# Patient Record
Sex: Male | Born: 1960 | Race: White | Hispanic: No | Smoking: Current every day smoker
Health system: Southern US, Community
[De-identification: ages and names within clinical notes are randomized; demographics above are authoritative.]

## PROBLEM LIST (undated history)

## (undated) DIAGNOSIS — M5136 Other intervertebral disc degeneration, lumbar region: Secondary | ICD-10-CM

## (undated) DIAGNOSIS — B1920 Unspecified viral hepatitis C without hepatic coma: Secondary | ICD-10-CM

## (undated) DIAGNOSIS — M48061 Spinal stenosis, lumbar region without neurogenic claudication: Secondary | ICD-10-CM

## (undated) DIAGNOSIS — I10 Essential (primary) hypertension: Secondary | ICD-10-CM

## (undated) DIAGNOSIS — M4300 Spondylolysis, site unspecified: Secondary | ICD-10-CM

## (undated) DIAGNOSIS — R011 Cardiac murmur, unspecified: Secondary | ICD-10-CM

## (undated) DIAGNOSIS — F32A Depression, unspecified: Secondary | ICD-10-CM

## (undated) DIAGNOSIS — M5416 Radiculopathy, lumbar region: Secondary | ICD-10-CM

## (undated) DIAGNOSIS — Z720 Tobacco use: Secondary | ICD-10-CM

## (undated) DIAGNOSIS — M51369 Other intervertebral disc degeneration, lumbar region without mention of lumbar back pain or lower extremity pain: Secondary | ICD-10-CM

## (undated) HISTORY — DX: Spondylolysis, site unspecified: M43.00

## (undated) HISTORY — DX: Spinal stenosis, lumbar region without neurogenic claudication: M48.061

## (undated) HISTORY — DX: Tobacco use: Z72.0

## (undated) HISTORY — DX: Radiculopathy, lumbar region: M54.16

## (undated) HISTORY — DX: Other intervertebral disc degeneration, lumbar region without mention of lumbar back pain or lower extremity pain: M51.369

## (undated) HISTORY — DX: Unspecified viral hepatitis C without hepatic coma: B19.20

## (undated) HISTORY — DX: Other intervertebral disc degeneration, lumbar region: M51.36

## (undated) HISTORY — DX: Essential (primary) hypertension: I10

---

## 2001-10-10 ENCOUNTER — Emergency Department (HOSPITAL_COMMUNITY): Admission: EM | Admit: 2001-10-10 | Discharge: 2001-10-10 | Payer: Self-pay | Admitting: Emergency Medicine

## 2018-05-01 ENCOUNTER — Encounter: Payer: Self-pay | Admitting: Hematology

## 2018-05-07 ENCOUNTER — Inpatient Hospital Stay (HOSPITAL_COMMUNITY): Payer: Managed Care, Other (non HMO)

## 2018-05-07 ENCOUNTER — Encounter (HOSPITAL_COMMUNITY): Payer: Self-pay | Admitting: Hematology

## 2018-05-07 ENCOUNTER — Inpatient Hospital Stay (HOSPITAL_COMMUNITY): Payer: Managed Care, Other (non HMO) | Attending: Hematology | Admitting: Hematology

## 2018-05-07 VITALS — BP 148/87 | HR 63 | Temp 97.7°F | Resp 14 | Ht 73.75 in | Wt 194.8 lb

## 2018-05-07 DIAGNOSIS — D7589 Other specified diseases of blood and blood-forming organs: Secondary | ICD-10-CM

## 2018-05-07 DIAGNOSIS — R634 Abnormal weight loss: Secondary | ICD-10-CM | POA: Diagnosis not present

## 2018-05-07 DIAGNOSIS — D61818 Other pancytopenia: Secondary | ICD-10-CM | POA: Diagnosis present

## 2018-05-07 DIAGNOSIS — D696 Thrombocytopenia, unspecified: Secondary | ICD-10-CM

## 2018-05-07 DIAGNOSIS — F17210 Nicotine dependence, cigarettes, uncomplicated: Secondary | ICD-10-CM | POA: Insufficient documentation

## 2018-05-07 LAB — COMPREHENSIVE METABOLIC PANEL
ALT: 55 U/L — ABNORMAL HIGH (ref 0–44)
AST: 57 U/L — ABNORMAL HIGH (ref 15–41)
Albumin: 3.6 g/dL (ref 3.5–5.0)
Alkaline Phosphatase: 51 U/L (ref 38–126)
Anion gap: 6 (ref 5–15)
BUN: 14 mg/dL (ref 6–20)
CO2: 27 mmol/L (ref 22–32)
Calcium: 8.8 mg/dL — ABNORMAL LOW (ref 8.9–10.3)
Chloride: 107 mmol/L (ref 98–111)
Creatinine, Ser: 0.95 mg/dL (ref 0.61–1.24)
GFR calc Af Amer: >60 mL/min (ref >60)
GFR calc non Af Amer: >60 mL/min (ref >60)
Glucose, Bld: 102 mg/dL — ABNORMAL HIGH (ref 70–99)
Potassium: 4.2 mmol/L (ref 3.5–5.1)
Sodium: 140 mmol/L (ref 135–145)
Total Bilirubin: 0.4 mg/dL (ref 0.3–1.2)
Total Protein: 7.7 g/dL (ref 6.5–8.1)

## 2018-05-07 LAB — CBC WITH DIFFERENTIAL/PLATELET
Basophils Absolute: 0 10*3/uL (ref 0.0–0.1)
Basophils Relative: 1 %
Eosinophils Absolute: 0.1 10*3/uL (ref 0.0–0.7)
Eosinophils Relative: 2 %
HCT: 37.6 % — ABNORMAL LOW (ref 39.0–52.0)
Hemoglobin: 12.6 g/dL — ABNORMAL LOW (ref 13.0–17.0)
Lymphocytes Relative: 62 %
Lymphs Abs: 1.4 10*3/uL (ref 0.7–4.0)
MCH: 34.2 pg — ABNORMAL HIGH (ref 26.0–34.0)
MCHC: 33.5 g/dL (ref 30.0–36.0)
MCV: 102.2 fL — ABNORMAL HIGH (ref 78.0–100.0)
Monocytes Absolute: 0 10*3/uL — ABNORMAL LOW (ref 0.1–1.0)
Monocytes Relative: 1 %
Neutro Abs: 0.8 10*3/uL — ABNORMAL LOW (ref 1.7–7.7)
Neutrophils Relative %: 34 %
Platelets: 85 10*3/uL — ABNORMAL LOW (ref 150–400)
RBC: 3.68 MIL/uL — ABNORMAL LOW (ref 4.22–5.81)
RDW: 14 % (ref 11.5–15.5)
WBC: 2.2 10*3/uL — ABNORMAL LOW (ref 4.0–10.5)

## 2018-05-07 LAB — TSH: TSH: 0.51 u[IU]/mL (ref 0.350–4.500)

## 2018-05-07 LAB — RETICULOCYTES
RBC.: 3.68 MIL/uL — ABNORMAL LOW (ref 4.22–5.81)
Retic Count, Absolute: 47.8 10*3/uL (ref 19.0–186.0)
Retic Ct Pct: 1.3 % (ref 0.4–3.1)

## 2018-05-07 LAB — LACTATE DEHYDROGENASE: LDH: 137 U/L (ref 98–192)

## 2018-05-07 LAB — VITAMIN B12: Vitamin B-12: 147 pg/mL — ABNORMAL LOW (ref 180–914)

## 2018-05-07 NOTE — Assessment & Plan Note (Addendum)
1.  Pancytopenia: -CBC on 04/23/2018 shows white count of 2.4, platelet count of 88 with an MCV of 107. -This was repeated on 05/01/2018 when the white count was 2.9, platelet count of 107 with a normal differential. -Patient reports throat infection, was seen at his PMDs office on 04/10/2018 and was treated with Z-Pak and nystatin. - Work-up for pancytopenia including K81 and folic acid showed normal folic acid.  B12 was 292 which was borderline. - Differential diagnosis includes drug-induced cytopenias.  Denies any history of liver disorders.  Does not drink alcohol.  We will repeat his CBC with differential and review the smear.  As there is a history of elevated liver enzymes, we will repeat comprehensive metabolic panel today.  We will also check a B12 level, TSH, SPEP, LDH and reticulocyte count. -We will do further work-up including scans if his LFTs remain elevated.  2.  Macrocytosis: -His MCV was found to be elevated at 107 with the first set of labs and 100 with the second set of labs. -Differential diagnosis includes B12 deficiency, liver disease, hypothyroidism.

## 2018-05-07 NOTE — Progress Notes (Signed)
CONSULT NOTE  Patient Care Team: Celene Squibb, MD as PCP - General (Internal Medicine)  CHIEF COMPLAINTS/PURPOSE OF CONSULTATION:  Pancytopenia and macrocytosis.  HISTORY OF PRESENTING ILLNESS:  Logan Brady 57 y.o. male is seen in consultation today for further work-up of pancytopenia and thrombocytosis.  He reportedly had a throat infection and was seen by his PMD on 04/10/2018.  He was treated with Z-Pak and nystatin, when strep was ruled out.  Blood work was drawn on 04/23/2018 which showed elevated liver enzymes and leukopenia and thrombocytopenia with white count of 2.4 and platelet count of 88 respectively.  He had a repeat blood work on 05/01/2018 which showed white count improved to 2.9, platelet count of 107.  MCV was high on both occasions.  Folic acid was normal.  B12 was borderline.  He denies any tingling or numbness in extremities.  He denies any fevers or night sweats.  He did have a 15 pound weight loss when he had flu 4 to 5 months ago and could not eat for a week.  He gained some of the weight back.  He gives a history that he was told to take B complex vitamin 4 years ago by his previous doctor.  He did not take it but for a few months.  He was also taking ibuprofen close to thousand milligrams daily for 5 years until 6 months ago for his back pain.  He discontinued it as he developed a reaction to it in the form of skin rash. 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  He denies any occupational exposure to chemicals.  MEDICAL HISTORY:  Past Medical History:  Diagnosis Date  . Tobacco abuse     SURGICAL HISTORY: History reviewed. No pertinent surgical history.  SOCIAL HISTORY: Social History   Socioeconomic History  . Marital status: Divorced    Spouse name: Not on file  . Number of children: 4  . Years of education: 51  . Highest education level: High school graduate  Occupational  History  . Occupation: maintenance  Social Needs  . Financial resource strain: Not hard at all  . Food insecurity:    Worry: Never true    Inability: Never true  . Transportation needs:    Medical: No    Non-medical: No  Tobacco Use  . Smoking status: Current Every Day Smoker    Packs/day: 1.00    Years: 40.00    Pack years: 40.00  . Smokeless tobacco: Never Used  Substance and Sexual Activity  . Alcohol use: Not Currently  . Drug use: Not Currently    Comment: quit 30 years ago  . Sexual activity: Yes  Lifestyle  . Physical activity:    Days per week: 2 days    Minutes per session: 30 min  . Stress: Not at all  Relationships  . Social connections:    Talks on phone: More than three times a week    Gets together: More than three times a week    Attends religious service: Never    Active member of club or organization: No    Attends meetings of clubs or organizations: Never    Relationship status: Divorced  . Intimate partner violence:    Fear of current or ex partner: No    Emotionally abused: No    Physically abused: No    Forced sexual activity: No  Other Topics Concern  .  Not on file  Social History Narrative  . Not on file    FAMILY HISTORY: Family History  Problem Relation Age of Onset  . Heart attack Mother   . Lung cancer Father     ALLERGIES:  has No Known Allergies.  MEDICATIONS:  No current outpatient medications on file.   No current facility-administered medications for this visit.     REVIEW OF SYSTEMS:   Constitutional: Denies fevers, chills or abnormal night sweats Eyes: Denies blurriness of vision, double vision or watery eyes Ears, nose, mouth, throat, and face: Denies mucositis or sore throat Respiratory: Denies cough, dyspnea or wheezes Cardiovascular: Denies palpitation, chest discomfort or lower extremity swelling Gastrointestinal:  Denies nausea, heartburn or change in bowel habits Skin: Denies abnormal skin rashes Lymphatics:  Denies new lymphadenopathy or easy bruising Neurological:Denies numbness, tingling or new weaknesses Behavioral/Psych: Mood is stable, no new changes  All other systems were reviewed with the patient and are negative.  PHYSICAL EXAMINATION: ECOG PERFORMANCE STATUS: 0 - Asymptomatic  Vitals:   05/07/18 1129  BP: (!) 148/87  Pulse: 63  Resp: 14  Temp: 97.7 F (36.5 C)  SpO2: 99%   Filed Weights   05/07/18 1129  Weight: 194 lb 12.8 oz (88.4 kg)    GENERAL:alert, no distress and comfortable SKIN: skin color, texture, turgor are normal, no rashes or significant lesions EYES: normal, conjunctiva are pink and non-injected, sclera clear OROPHARYNX:no exudate, no erythema and lips, buccal mucosa, and tongue normal  NECK: supple, thyroid normal size, non-tender, without nodularity LYMPH:  no palpable lymphadenopathy in the cervical, axillary or inguinal LUNGS: clear to auscultation and percussion with normal breathing effort HEART: regular rate & rhythm and no murmurs and no lower extremity edema ABDOMEN:abdomen soft, non-tender and normal bowel sounds  PSYCH: alert & oriented x 3 with fluent speech   LABORATORY DATA:  I have reviewed the labs from Dr. Juel Burrow office.     ASSESSMENT & PLAN:  Other pancytopenia ([place]) 1.  Pancytopenia: -CBC on 04/23/2018 shows white count of 2.4, platelet count of 88 with an MCV of 107. -This was repeated on 05/01/2018 when the white count was 2.9, platelet count of 107 with a normal differential. -Patient reports throat infection, was seen at his PMDs office on 04/10/2018 and was treated with Z-Pak and nystatin. - Work-up for pancytopenia including N02 and folic acid showed normal folic acid.  B12 was 292 which was borderline. - Differential diagnosis includes drug-induced cytopenias.  Denies any history of liver disorders.  Does not drink alcohol.  We will repeat his CBC with differential and review the smear.  As there is a history of elevated liver  enzymes, we will repeat comprehensive metabolic panel today.  We will also check a B12 level, TSH, SPEP, LDH and reticulocyte count. -We will do further work-up including scans if his LFTs remain elevated.  2.  Macrocytosis: -His MCV was found to be elevated at 107 with the first set of labs and 100 with the second set of labs. -Differential diagnosis includes B12 deficiency, liver disease, hypothyroidism.   All questions were answered. The patient knows to call the clinic with any problems, questions or concerns.      Derek Jack, MD 05/07/18 12:24 PM

## 2018-05-07 NOTE — Patient Instructions (Signed)
[facility] at [facility] Discharge Instructions  You saw Dr. Katragadda today.   Thank you for choosing [facility] at [facility] to provide your oncology and hematology care.  To afford each patient quality time with our provider, please arrive at least 15 minutes before your scheduled appointment time.   If you have a lab appointment with the Cancer Center please come in thru the  Main Entrance and check in at the main information desk  You need to re-schedule your appointment should you arrive 10 or more minutes late.  We strive to give you quality time with our providers, and arriving late affects you and other patients whose appointments are after yours.  Also, if you no show three or more times for appointments you may be dismissed from the clinic at the providers discretion.     Again, thank you for choosing [facility].  Our hope is that these requests will decrease the amount of time that you wait before being seen by our physicians.       _____________________________________________________________  Should you have questions after your visit to [facility], please contact our office at [phone] between the hours of 8:30 a.m. and 4:30 p.m.  Voicemails left after 4:30 p.m. will not be returned until the following business day.  For prescription refill requests, have your pharmacy contact our office.       Resources For Cancer Patients and their Caregivers ? American Cancer Society: Can assist with transportation, wigs, general needs, runs Look Good Feel Better.        1-888-227-6333 ? Cancer Care: Provides financial assistance, online support groups, medication/co-pay assistance.  1-800-813-HOPE (4673) ? [facility] Assists [place] cancer patients and their families through emotional , educational and financial support.  336-427-4357 ? [place] DSS Where to apply for  food stamps, Medicaid and utility assistance. 336-342-1394 ? [facility]: Transportation to medical appointments. 336-347-2287 ? Social Security Administration: May apply for disability if have a Stage IV cancer. 336-342-7796 1-800-772-1213 ? [place] Aging, Disability and Transit Services: Assists with nutrition, care and transit needs. 336-349-2343  Cancer Center Support Programs:   > Cancer Support Group  2nd Tuesday of the month 1pm-2pm, Journey Room   > Creative Journey  3rd Tuesday of the month 1130am-1pm, Journey Room     

## 2018-05-08 LAB — PROTEIN ELECTROPHORESIS, SERUM
A/G Ratio: 0.9 (ref 0.7–1.7)
Albumin ELP: 3.6 g/dL (ref 2.9–4.4)
Alpha-1-Globulin: 0.3 g/dL (ref 0.0–0.4)
Alpha-2-Globulin: 0.6 g/dL (ref 0.4–1.0)
Beta Globulin: 0.7 g/dL (ref 0.7–1.3)
Gamma Globulin: 2.2 g/dL — ABNORMAL HIGH (ref 0.4–1.8)
Globulin, Total: 3.8 g/dL (ref 2.2–3.9)
Total Protein ELP: 7.4 g/dL (ref 6.0–8.5)

## 2018-05-08 LAB — PATHOLOGIST SMEAR REVIEW

## 2018-05-13 ENCOUNTER — Encounter (HOSPITAL_COMMUNITY): Payer: Self-pay | Admitting: General Practice

## 2018-05-13 ENCOUNTER — Encounter: Payer: Self-pay | Admitting: General Practice

## 2018-05-13 NOTE — Progress Notes (Signed)
[facility] Psychosocial Distress Screening Clinical Social Work  Clinical Social Work was referred by distress screening protocol.  The patient scored a 5 on the Psychosocial Distress Thermometer which indicates moderate distress. Clinical Social Worker contacted patient by phone to assess for distress and other psychosocial needs. Unable to reach patient, left VM w information on support services available at [facility] and CSW contact information. Asked him to call if needed.    ONCBCN DISTRESS SCREENING 05/07/2018  Screening Type Initial Screening  Distress experienced in past week (1-10) 5  Information Concerns Type Lack of info about diagnosis  Physician notified of physical symptoms Yes  Referral to clinical psychology No  Referral to clinical social work Yes  Referral to dietition No  Referral to financial advocate No  Referral to support programs No  Referral to palliative care No    Clinical Social Worker follow up needed: No.  If yes, follow up plan:  Beverely Pace, [place], LCSW Clinical Social Worker Phone:  [phone]

## 2018-05-23 ENCOUNTER — Inpatient Hospital Stay (HOSPITAL_COMMUNITY): Payer: Managed Care, Other (non HMO) | Attending: Hematology | Admitting: Hematology

## 2018-05-23 ENCOUNTER — Other Ambulatory Visit: Payer: Self-pay

## 2018-05-23 ENCOUNTER — Encounter (HOSPITAL_COMMUNITY): Payer: Self-pay | Admitting: Hematology

## 2018-05-23 VITALS — BP 128/83 | HR 47 | Temp 98.1°F | Resp 16 | Wt 188.1 lb

## 2018-05-23 DIAGNOSIS — D7589 Other specified diseases of blood and blood-forming organs: Secondary | ICD-10-CM | POA: Diagnosis not present

## 2018-05-23 DIAGNOSIS — F17210 Nicotine dependence, cigarettes, uncomplicated: Secondary | ICD-10-CM

## 2018-05-23 DIAGNOSIS — K769 Liver disease, unspecified: Secondary | ICD-10-CM

## 2018-05-23 DIAGNOSIS — Z79899 Other long term (current) drug therapy: Secondary | ICD-10-CM | POA: Diagnosis not present

## 2018-05-23 DIAGNOSIS — E538 Deficiency of other specified B group vitamins: Secondary | ICD-10-CM | POA: Insufficient documentation

## 2018-05-23 DIAGNOSIS — D61818 Other pancytopenia: Secondary | ICD-10-CM | POA: Insufficient documentation

## 2018-05-23 DIAGNOSIS — R634 Abnormal weight loss: Secondary | ICD-10-CM

## 2018-05-23 DIAGNOSIS — G8929 Other chronic pain: Secondary | ICD-10-CM | POA: Diagnosis not present

## 2018-05-23 DIAGNOSIS — R5383 Other fatigue: Secondary | ICD-10-CM | POA: Insufficient documentation

## 2018-05-23 MED ORDER — CYANOCOBALAMIN 1000 MCG/ML IJ SOLN
1000.0000 ug | Freq: Once | INTRAMUSCULAR | Status: AC
  Administered 2018-05-23: 1000 ug via INTRAMUSCULAR

## 2018-05-23 MED ORDER — CYANOCOBALAMIN 1000 MCG/ML IJ SOLN
INTRAMUSCULAR | Status: AC
  Filled 2018-05-23: qty 1

## 2018-05-23 NOTE — Progress Notes (Signed)
[name], [place] [address]   CLINIC:  Medical Oncology/Hematology  PCP:  Celene Squibb, MD [place] [address] [phone]   REASON FOR VISIT:  Follow-up for Pancytopenia and macrocytosis  CURRENT THERAPY: B12 injections weekly started 05/23/18     INTERVAL HISTORY:  Logan Brady 57 y.o. male returns for routine follow-up for pancytopenia and macrocytosis. His B12 levels are low. Patient states he has lost about 25 pound in the past 3 months but has not been eating like he should. His appetite is improving but not up to what he normally eats. Patient asked about drinking a boost and we agreed. Patient has been fatigued for a few months now. Patient has had chronic pain issues for years and has been taking ibuprofen everyday for years until he developed a rash from it and had to stop. Patient just started taking a multi-vitamin in the past 2 weeks which contains B12 but he can not remember how much. Patient denies nausea, vomiting, or diarrhea. Denies any new pains. Denies any fevers or infections recently.     REVIEW OF SYSTEMS:  Review of Systems  Constitutional: Positive for fatigue and unexpected weight change.  HENT:  Negative.   Eyes: Negative.   Respiratory: Negative.   Cardiovascular: Negative.   Gastrointestinal: Negative.   Endocrine: Negative.   Genitourinary: Negative.    Musculoskeletal: Negative.   Skin: Negative.   Neurological: Negative.   Hematological: Negative.   Psychiatric/Behavioral: Negative.      PAST MEDICAL/SURGICAL HISTORY:  Past Medical History:  Diagnosis Date  . Tobacco abuse    History reviewed. No pertinent surgical history.   SOCIAL HISTORY:  Social History   Socioeconomic History  . Marital status: Divorced    Spouse name: Not on file  . Number of children: 4  . Years of education: 69  . Highest education level: High school graduate  Occupational History  .  Occupation: maintenance  Social Needs  . Financial resource strain: Not hard at all  . Food insecurity:    Worry: Never true    Inability: Never true  . Transportation needs:    Medical: No    Non-medical: No  Tobacco Use  . Smoking status: Current Every Day Smoker    Packs/day: 1.00    Years: 40.00    Pack years: 40.00  . Smokeless tobacco: Never Used  Substance and Sexual Activity  . Alcohol use: Not Currently  . Drug use: Not Currently    Comment: quit 30 years ago  . Sexual activity: Yes  Lifestyle  . Physical activity:    Days per week: 2 days    Minutes per session: 30 min  . Stress: Not at all  Relationships  . Social connections:    Talks on phone: More than three times a week    Gets together: More than three times a week    Attends religious service: Never    Active member of club or organization: No    Attends meetings of clubs or organizations: Never    Relationship status: Divorced  . Intimate partner violence:    Fear of current or ex partner: No    Emotionally abused: No    Physically abused: No    Forced sexual activity: No  Other Topics Concern  . Not on file  Social History Narrative  . Not on file    FAMILY HISTORY:  Family History  Problem Relation  Age of Onset  . Heart attack Mother   . Lung cancer Father     CURRENT MEDICATIONS:  Outpatient Encounter Medications as of 05/23/2018  Medication Sig  . tiZANidine (ZANAFLEX) 4 MG tablet TAKE 1 TABLET BY MOUTH EVERY 6 HOURS AS NEEDED FOR MUSCLE SPASM  . [EXPIRED] cyanocobalamin ((VITAMIN B-12)) injection 1,000 mcg    No facility-administered encounter medications on file as of 05/23/2018.     ALLERGIES:  No Known Allergies   PHYSICAL EXAM:  ECOG Performance status: 1  Vitals:   05/23/18 1551  BP: 128/83  Pulse: (!) 47  Resp: 16  Temp: 98.1 F (36.7 C)  SpO2: 100%   Filed Weights   05/23/18 1551  Weight: 188 lb 1.6 oz (85.3 kg)    Physical Exam   LABORATORY DATA:  I have  reviewed the labs as listed.  CBC    Component Value Date/Time   WBC 2.2 (L) 05/07/2018 1238   RBC 3.68 (L) 05/07/2018 1238   RBC 3.68 (L) 05/07/2018 1238   HGB 12.6 (L) 05/07/2018 1238   HCT 37.6 (L) 05/07/2018 1238   PLT 85 (L) 05/07/2018 1238   MCV 102.2 (H) 05/07/2018 1238   MCH 34.2 (H) 05/07/2018 1238   MCHC 33.5 05/07/2018 1238   RDW 14.0 05/07/2018 1238   LYMPHSABS 1.4 05/07/2018 1238   MONOABS 0.0 (L) 05/07/2018 1238   EOSABS 0.1 05/07/2018 1238   BASOSABS 0.0 05/07/2018 1238   CMP Latest Ref Rng & Units 05/07/2018  Glucose 70 - 99 mg/dL 102(H)  BUN 6 - 20 mg/dL 14  Creatinine 0.61 - 1.24 mg/dL 0.95  Sodium 135 - 145 mmol/L 140  Potassium 3.5 - 5.1 mmol/L 4.2  Chloride 98 - 111 mmol/L 107  CO2 22 - 32 mmol/L 27  Calcium 8.9 - 10.3 mg/dL 8.8(L)  Total Protein 6.5 - 8.1 g/dL 7.7  Total Bilirubin 0.3 - 1.2 mg/dL 0.4  Alkaline Phos 38 - 126 U/L 51  AST 15 - 41 U/L 57(H)  ALT 0 - 44 U/L 55(H)         ASSESSMENT & PLAN:   Other pancytopenia ([place]) 1.  Pancytopenia: -CBC on 04/23/2018 shows white count of 2.4, platelet count of 88 with an MCV of 107. -This was repeated on 05/01/2018 when the white count was 2.9, platelet count of 107 with a normal differential. -Patient reports throat infection, was seen at his PMDs office on 04/10/2018 and was treated with Z-Pak and nystatin. - Repeat B12 was found to be severely low at 147.  We will start him on weekly B12 injections and then monthly until his counts recover.  He was also told to take B12 1 mg tablet daily.  We will discontinue parenteral B12 once his counts normalized.  If they do not he will need further testing with a bone marrow aspiration and biopsy. - His AST and ALT are slightly elevated in the 50s.  We will repeat again in 2 months.  If they continue to be elevated will consider imaging. - I will check his CBC with differential in 3 weeks to see if it is improving.  I will see him back in 2 months with repeat  blood work. - He reports 25 pound weight loss in the last 3 months.  However he also had a flu infection which was severe when he lost most of his weight.  He has regained his appetite at this time.  We have suggested him to drink 1 can  of Ensure daily.  2.  Macrocytosis: -He has high MCV likely from B12 deficiency and underlying liver disease.      Orders placed this encounter:  Orders Placed This Encounter  Procedures  . CBC with Differential/Platelet  . Vitamin B12  . Lactate dehydrogenase  . CBC with Differential  . Comprehensive metabolic panel  . [place], [place] [phone]

## 2018-05-23 NOTE — Progress Notes (Signed)
Pt given B12 injection in left deltoid. Pt tolerated injection well. Pt stable and discharged home ambulatory. Pt to return in 1 week for next B12 injection.

## 2018-05-23 NOTE — Assessment & Plan Note (Addendum)
1.  Pancytopenia: -CBC on 04/23/2018 shows white count of 2.4, platelet count of 88 with an MCV of 107. -This was repeated on 05/01/2018 when the white count was 2.9, platelet count of 107 with a normal differential. -Patient reports throat infection, was seen at his PMDs office on 04/10/2018 and was treated with Z-Pak and nystatin. - Repeat B12 was found to be severely low at 147.  We will start him on weekly B12 injections and then monthly until his counts recover.  He was also told to take B12 1 mg tablet daily.  We will discontinue parenteral B12 once his counts normalized.  If they do not he will need further testing with a bone marrow aspiration and biopsy. - His AST and ALT are slightly elevated in the 50s.  We will repeat again in 2 months.  If they continue to be elevated will consider imaging. - I will check his CBC with differential in 3 weeks to see if it is improving.  I will see him back in 2 months with repeat blood work. - He reports 25 pound weight loss in the last 3 months.  However he also had a flu infection which was severe when he lost most of his weight.  He has regained his appetite at this time.  We have suggested him to drink 1 can of Ensure daily.  2.  Macrocytosis: -He has high MCV likely from B12 deficiency and underlying liver disease.

## 2018-05-30 ENCOUNTER — Other Ambulatory Visit: Payer: Self-pay

## 2018-05-30 ENCOUNTER — Inpatient Hospital Stay (HOSPITAL_COMMUNITY): Payer: Managed Care, Other (non HMO)

## 2018-05-30 ENCOUNTER — Encounter (HOSPITAL_COMMUNITY): Payer: Self-pay

## 2018-05-30 VITALS — BP 133/86 | HR 64 | Temp 97.8°F | Resp 16

## 2018-05-30 DIAGNOSIS — D61818 Other pancytopenia: Secondary | ICD-10-CM

## 2018-05-30 MED ORDER — CYANOCOBALAMIN 1000 MCG/ML IJ SOLN
1000.0000 ug | Freq: Once | INTRAMUSCULAR | Status: AC
  Administered 2018-05-30: 1000 ug via INTRAMUSCULAR

## 2018-05-30 MED ORDER — CYANOCOBALAMIN 1000 MCG/ML IJ SOLN
INTRAMUSCULAR | Status: AC
  Filled 2018-05-30: qty 1

## 2018-05-30 NOTE — Progress Notes (Signed)
Pt here today for 2nd weekly B12 injection. Pt given injection in left deltoid. Pt tolerated injection well. Pt stable and discharged home ambulatory. Pt to return as scheduled for next B12 injection.

## 2018-06-06 ENCOUNTER — Encounter (HOSPITAL_COMMUNITY): Payer: Self-pay

## 2018-06-06 ENCOUNTER — Inpatient Hospital Stay (HOSPITAL_COMMUNITY): Payer: Managed Care, Other (non HMO)

## 2018-06-06 VITALS — BP 144/96 | HR 66 | Temp 98.1°F | Resp 18 | Wt 190.0 lb

## 2018-06-06 DIAGNOSIS — D61818 Other pancytopenia: Secondary | ICD-10-CM | POA: Diagnosis not present

## 2018-06-06 MED ORDER — CYANOCOBALAMIN 1000 MCG/ML IJ SOLN
1000.0000 ug | Freq: Once | INTRAMUSCULAR | Status: AC
  Administered 2018-06-06: 1000 ug via INTRAMUSCULAR
  Filled 2018-06-06: qty 1

## 2018-06-06 NOTE — Patient Instructions (Signed)
[facility] at [facility]  Discharge Instructions:  You received a b12 shot today.  _______________________________________________________________  Thank you for choosing [facility] at [facility] to provide your oncology and hematology care.  To afford each patient quality time with our providers, please arrive at least 15 minutes before your scheduled appointment.  You need to re-schedule your appointment if you arrive 10 or more minutes late.  We strive to give you quality time with our providers, and arriving late affects you and other patients whose appointments are after yours.  Also, if you no show three or more times for appointments you may be dismissed from the clinic.  Again, thank you for choosing [facility] at [facility]. Our hope is that these requests will allow you access to exceptional care and in a timely manner. _______________________________________________________________  If you have questions after your visit, please contact our office at [phone] between the hours of 8:30 a.m. and 5:00 p.m. Voicemails left after 4:30 p.m. will not be returned until the following business day. _______________________________________________________________  For prescription refill requests, have your pharmacy contact our office. _______________________________________________________________  Recommendations made by the consultant and any test results will be sent to your referring physician. _______________________________________________________________ 

## 2018-06-06 NOTE — Progress Notes (Signed)
Patient tolerated injection with no complaints voiced.  Site clean and dry.  Band aid applied.  VSs with discharge and left ambulatory with no s/s of distress noted.  

## 2018-06-13 ENCOUNTER — Other Ambulatory Visit: Payer: Self-pay

## 2018-06-13 ENCOUNTER — Inpatient Hospital Stay (HOSPITAL_COMMUNITY): Payer: Managed Care, Other (non HMO)

## 2018-06-13 ENCOUNTER — Inpatient Hospital Stay (HOSPITAL_COMMUNITY): Payer: Managed Care, Other (non HMO) | Attending: Hematology

## 2018-06-13 ENCOUNTER — Encounter (HOSPITAL_COMMUNITY): Payer: Self-pay

## 2018-06-13 ENCOUNTER — Telehealth (HOSPITAL_COMMUNITY): Payer: Self-pay | Admitting: Nurse Practitioner

## 2018-06-13 ENCOUNTER — Other Ambulatory Visit (HOSPITAL_COMMUNITY): Payer: Managed Care, Other (non HMO)

## 2018-06-13 VITALS — BP 156/116 | HR 65 | Temp 97.8°F | Resp 16

## 2018-06-13 DIAGNOSIS — D61818 Other pancytopenia: Secondary | ICD-10-CM

## 2018-06-13 DIAGNOSIS — E538 Deficiency of other specified B group vitamins: Secondary | ICD-10-CM | POA: Insufficient documentation

## 2018-06-13 DIAGNOSIS — D7589 Other specified diseases of blood and blood-forming organs: Secondary | ICD-10-CM | POA: Diagnosis not present

## 2018-06-13 LAB — CBC WITH DIFFERENTIAL/PLATELET
Basophils Absolute: 0 10*3/uL (ref 0.0–0.1)
Basophils Relative: 0 %
Eosinophils Absolute: 0.1 10*3/uL (ref 0.0–0.7)
Eosinophils Relative: 2 %
HCT: 40.2 % (ref 39.0–52.0)
Hemoglobin: 13.9 g/dL (ref 13.0–17.0)
Lymphocytes Relative: 58 %
Lymphs Abs: 1.5 10*3/uL (ref 0.7–4.0)
MCH: 35.3 pg — ABNORMAL HIGH (ref 26.0–34.0)
MCHC: 34.6 g/dL (ref 30.0–36.0)
MCV: 102 fL — ABNORMAL HIGH (ref 78.0–100.0)
Monocytes Absolute: 0 10*3/uL — ABNORMAL LOW (ref 0.1–1.0)
Monocytes Relative: 0 %
Neutro Abs: 1.1 10*3/uL — ABNORMAL LOW (ref 1.7–7.7)
Neutrophils Relative %: 40 %
Platelets: 75 10*3/uL — ABNORMAL LOW (ref 150–400)
RBC: 3.94 MIL/uL — ABNORMAL LOW (ref 4.22–5.81)
RDW: 13.8 % (ref 11.5–15.5)
WBC: 2.7 10*3/uL — ABNORMAL LOW (ref 4.0–10.5)

## 2018-06-13 MED ORDER — CYANOCOBALAMIN 1000 MCG/ML IJ SOLN
INTRAMUSCULAR | Status: AC
  Filled 2018-06-13: qty 1

## 2018-06-13 MED ORDER — CYANOCOBALAMIN 1000 MCG/ML IJ SOLN
1000.0000 ug | Freq: Once | INTRAMUSCULAR | Status: AC
  Administered 2018-06-13: 1000 ug via INTRAMUSCULAR

## 2018-06-13 NOTE — Patient Instructions (Signed)
[facility] at [facility] Discharge Instructions  B12 injection done today. Follow up as scheduled.   Thank you for choosing [facility] to provide your oncology and hematology care.  To afford each patient quality time with our provider, please arrive at least 15 minutes before your scheduled appointment time.   If you have a lab appointment with the [facility] please come in thru the  Main Entrance and check in at the main information desk  You need to re-schedule your appointment should you arrive 10 or more minutes late.  We strive to give you quality time with our providers, and arriving late affects you and other patients whose appointments are after yours.  Also, if you no show three or more times for appointments you may be dismissed from the clinic at the providers discretion.     Again, thank you for choosing [facility].  Our hope is that these requests will decrease the amount of time that you wait before being seen by our physicians.       _____________________________________________________________  Should you have questions after your visit to [facility], please contact our office at [phone] between the hours of 8:00 a.m. and 4:30 p.m.  Voicemails left after 4:00 p.m. will not be returned until the following business day.  For prescription refill requests, have your pharmacy contact our office and allow 72 hours.    Cancer Center Support Programs:   > Cancer Support Group  2nd Tuesday of the month 1pm-2pm, Journey Room

## 2018-06-13 NOTE — Progress Notes (Signed)
Logan Brady presents today for injection per MD orders. B12 1,000 mcg administered SQ in left Upper Arm. Administration without incident. Patient tolerated well.   Patient tolerated it well without problems. Vitals stable and discharged home from clinic ambulatory. Follow up as scheduled.

## 2018-07-15 ENCOUNTER — Inpatient Hospital Stay (HOSPITAL_COMMUNITY): Payer: Managed Care, Other (non HMO) | Attending: Hematology

## 2018-07-15 ENCOUNTER — Encounter (HOSPITAL_COMMUNITY): Payer: Self-pay

## 2018-07-15 ENCOUNTER — Other Ambulatory Visit: Payer: Self-pay

## 2018-07-15 ENCOUNTER — Other Ambulatory Visit (HOSPITAL_COMMUNITY): Payer: Self-pay | Admitting: Hematology

## 2018-07-15 VITALS — BP 152/104 | HR 77 | Temp 98.6°F | Resp 16

## 2018-07-15 DIAGNOSIS — D61818 Other pancytopenia: Secondary | ICD-10-CM | POA: Diagnosis not present

## 2018-07-15 DIAGNOSIS — F17210 Nicotine dependence, cigarettes, uncomplicated: Secondary | ICD-10-CM | POA: Diagnosis not present

## 2018-07-15 DIAGNOSIS — R7989 Other specified abnormal findings of blood chemistry: Secondary | ICD-10-CM | POA: Insufficient documentation

## 2018-07-15 DIAGNOSIS — R634 Abnormal weight loss: Secondary | ICD-10-CM | POA: Insufficient documentation

## 2018-07-15 DIAGNOSIS — R030 Elevated blood-pressure reading, without diagnosis of hypertension: Secondary | ICD-10-CM | POA: Diagnosis not present

## 2018-07-15 DIAGNOSIS — Z801 Family history of malignant neoplasm of trachea, bronchus and lung: Secondary | ICD-10-CM | POA: Insufficient documentation

## 2018-07-15 DIAGNOSIS — D7589 Other specified diseases of blood and blood-forming organs: Secondary | ICD-10-CM | POA: Diagnosis not present

## 2018-07-15 DIAGNOSIS — Z79899 Other long term (current) drug therapy: Secondary | ICD-10-CM | POA: Insufficient documentation

## 2018-07-15 MED ORDER — CYANOCOBALAMIN 1000 MCG/ML IJ SOLN
1000.0000 ug | Freq: Once | INTRAMUSCULAR | Status: AC
  Administered 2018-07-15: 1000 ug via INTRAMUSCULAR

## 2018-07-15 MED ORDER — CYANOCOBALAMIN 1000 MCG/ML IJ SOLN
INTRAMUSCULAR | Status: AC
  Filled 2018-07-15: qty 1

## 2018-07-15 NOTE — Progress Notes (Signed)
B12 injection given as ordered. See MAR for details. Patient tolerated it well without problems. Vitals stable and discharged home from clinic ambulatory. Follow up as scheduled.

## 2018-07-23 ENCOUNTER — Inpatient Hospital Stay (HOSPITAL_COMMUNITY): Payer: Managed Care, Other (non HMO)

## 2018-07-23 DIAGNOSIS — D61818 Other pancytopenia: Secondary | ICD-10-CM | POA: Diagnosis not present

## 2018-07-23 LAB — CBC WITH DIFFERENTIAL/PLATELET
Basophils Absolute: 0 10*3/uL (ref 0.0–0.1)
Basophils Relative: 0 %
Eosinophils Absolute: 0 10*3/uL (ref 0.0–0.7)
Eosinophils Relative: 1 %
HCT: 37.7 % — ABNORMAL LOW (ref 39.0–52.0)
Hemoglobin: 13 g/dL (ref 13.0–17.0)
Lymphocytes Relative: 46 %
Lymphs Abs: 1.4 10*3/uL (ref 0.7–4.0)
MCH: 35.4 pg — ABNORMAL HIGH (ref 26.0–34.0)
MCHC: 34.5 g/dL (ref 30.0–36.0)
MCV: 102.7 fL — ABNORMAL HIGH (ref 78.0–100.0)
Monocytes Absolute: 0 10*3/uL — ABNORMAL LOW (ref 0.1–1.0)
Monocytes Relative: 1 %
Neutro Abs: 1.5 10*3/uL — ABNORMAL LOW (ref 1.7–7.7)
Neutrophils Relative %: 52 %
Platelets: 82 10*3/uL — ABNORMAL LOW (ref 150–400)
RBC: 3.67 MIL/uL — ABNORMAL LOW (ref 4.22–5.81)
RDW: 13.9 % (ref 11.5–15.5)
WBC: 3 10*3/uL — ABNORMAL LOW (ref 4.0–10.5)

## 2018-07-23 LAB — COMPREHENSIVE METABOLIC PANEL
ALT: 49 U/L — ABNORMAL HIGH (ref 0–44)
AST: 48 U/L — ABNORMAL HIGH (ref 15–41)
Albumin: 3.6 g/dL (ref 3.5–5.0)
Alkaline Phosphatase: 46 U/L (ref 38–126)
Anion gap: 7 (ref 5–15)
BUN: 15 mg/dL (ref 6–20)
CO2: 26 mmol/L (ref 22–32)
Calcium: 8.8 mg/dL — ABNORMAL LOW (ref 8.9–10.3)
Chloride: 105 mmol/L (ref 98–111)
Creatinine, Ser: 1.11 mg/dL (ref 0.61–1.24)
GFR calc Af Amer: >60 mL/min (ref >60)
GFR calc non Af Amer: >60 mL/min (ref >60)
Glucose, Bld: 148 mg/dL — ABNORMAL HIGH (ref 70–99)
Potassium: 3.9 mmol/L (ref 3.5–5.1)
Sodium: 138 mmol/L (ref 135–145)
Total Bilirubin: 0.7 mg/dL (ref 0.3–1.2)
Total Protein: 7.6 g/dL (ref 6.5–8.1)

## 2018-07-23 LAB — VITAMIN B12: Vitamin B-12: 290 pg/mL (ref 180–914)

## 2018-07-23 LAB — LACTATE DEHYDROGENASE: LDH: 133 U/L (ref 98–192)

## 2018-07-28 ENCOUNTER — Other Ambulatory Visit: Payer: Self-pay

## 2018-07-28 ENCOUNTER — Inpatient Hospital Stay (HOSPITAL_COMMUNITY): Payer: Managed Care, Other (non HMO) | Admitting: Internal Medicine

## 2018-07-28 ENCOUNTER — Encounter (HOSPITAL_COMMUNITY): Payer: Self-pay | Admitting: Internal Medicine

## 2018-07-28 DIAGNOSIS — Z801 Family history of malignant neoplasm of trachea, bronchus and lung: Secondary | ICD-10-CM

## 2018-07-28 DIAGNOSIS — D61818 Other pancytopenia: Secondary | ICD-10-CM

## 2018-07-28 DIAGNOSIS — D7589 Other specified diseases of blood and blood-forming organs: Secondary | ICD-10-CM | POA: Diagnosis not present

## 2018-07-28 DIAGNOSIS — Z79899 Other long term (current) drug therapy: Secondary | ICD-10-CM

## 2018-07-28 DIAGNOSIS — F17210 Nicotine dependence, cigarettes, uncomplicated: Secondary | ICD-10-CM

## 2018-07-28 DIAGNOSIS — R7989 Other specified abnormal findings of blood chemistry: Secondary | ICD-10-CM

## 2018-07-28 DIAGNOSIS — R030 Elevated blood-pressure reading, without diagnosis of hypertension: Secondary | ICD-10-CM

## 2018-07-28 DIAGNOSIS — Z720 Tobacco use: Secondary | ICD-10-CM

## 2018-07-28 DIAGNOSIS — R634 Abnormal weight loss: Secondary | ICD-10-CM | POA: Diagnosis not present

## 2018-07-28 NOTE — Patient Instructions (Addendum)
[place] at [facility] Discharge Instructions   You were seen today by Dr. Zoila Shutter  Your labs were reviewed with you by Dr. Walden Field. A copy of your lab work was given to you. You have some mild elevations in your liver function tests. Your white blood cell count and platelet count was low. Your B12 levels have improved.   We will do a CT scan of your chest, abdomen, and pelvis to look for any abnormalities. You will follow up with the MD after the scan.   If we don't see anything on the CT scan, you may need to have a bone marrow biopsy.  Thank you for choosing [place] at [facility] to provide your oncology and hematology care.  To afford each patient quality time with our provider, please arrive at least 15 minutes before your scheduled appointment time.    If you have a lab appointment with the [facility] please come in thru the  Main Entrance and check in at the main information desk  You need to re-schedule your appointment should you arrive 10 or more minutes late.  We strive to give you quality time with our providers, and arriving late affects you and other patients whose appointments are after yours.  Also, if you no show three or more times for appointments you may be dismissed from the clinic at the providers discretion.     Again, thank you for choosing [facility].  Our hope is that these requests will decrease the amount of time that you wait before being seen by our physicians.       _____________________________________________________________  Should you have questions after your visit to [facility], please contact our office at [phone] between the hours of 8:30 a.m. and 4:30 p.m.  Voicemails left after 4:30 p.m. will not be returned until the following business day.  For prescription refill requests, have your pharmacy contact our office.       Resources For Cancer Patients and  their Caregivers ? American Cancer Society: Can assist with transportation, wigs, general needs, runs Look Good Feel Better.        (403)564-4406 ? Cancer Care: Provides financial assistance, online support groups, medication/co-pay assistance.  1-800-813-HOPE 702-599-3966) ? [place] Assists [place] cancer patients and their families through emotional , educational and financial support.  (620)493-3047 ? [place] DSS Where to apply for food stamps, Medicaid and utility assistance. (754) 649-4001 ? [facility]: Transportation to medical appointments. 902-288-0614 ? Social Security Administration: May apply for disability if have a Stage IV cancer. 810-808-4821 3510739628 ? [employer], Disability and Transit Services: Assists with nutrition, care and transit needs. [place] Support Programs:   > Cancer Support Group  2nd Tuesday of the month 1pm-2pm, Journey Room   > Creative Journey  3rd Tuesday of the month 1130am-1pm, Journey Room

## 2018-07-28 NOTE — Progress Notes (Signed)
Diagnosis Abnormal weight loss - Plan: CBC with Differential/Platelet, Comprehensive metabolic panel, Lactate dehydrogenase, Hepatitis B surface antibody, Hepatitis B surface antigen, Hepatitis C RNA quantitative, Hepatitis panel, acute, CT Abdomen Pelvis W Contrast, HIV antibody (with reflex)  Tobacco use - Plan: CBC with Differential/Platelet, Comprehensive metabolic panel, Lactate dehydrogenase, Hepatitis B surface antibody, Hepatitis B surface antigen, Hepatitis C RNA quantitative, Hepatitis panel, acute, CT CHEST W CONTRAST, HIV antibody (with reflex)  Staging Cancer Staging No matching staging information was found for the patient.  Assessment and Plan:  1.  Pancytopenia.  Pt followed by Dr. Worthy Keeler.   -CBC on 04/23/2018 shows white count of 2.4, platelet count of 88 with an MCV of 107. -This was repeated on 05/01/2018 when the white count was 2.9, platelet count of 107 with a normal differential. -Patient reports throat infection, was seen at his PMDs office on 04/10/2018 and was treated with Z-Pak and nystatin. - Work-up for pancytopenia including I69 and folic acid showed normal folic acid.  B12 was 292 which was borderline.  Labs done 06/13/2018 showed WBC 2.7 HB 13.9 MCV 102 and plts 75,000.    Labs done 07/23/2018 reviewed and showed WBC 3, HB 13 plts 82,000.  Chemistries WNL with K+ 3.9 Cr 1.1 and mildly elevated LFTS.  Will check Hepatitis panel and HIV.    Pt will be set up for CT abdomen and pelvis to evaluate liver and spleen.  If unrevealing , he will be set up for bone marrow biopsy for definitive diagnosis.    2.  Elevated LFTs.  Will check hepatitis panel and HIV.  Will set up for CT abdomen and pelvis.  Pt will RTC to go over results.    3.  Thrombocytopenia.  Smear shows no fragmentation.  He has a normal LDH.  Hepatitis panel and HIV ordered.  PT will be set up for CT of abdomen and pelvis for further evaluation.    4.  Smoking.  PT has wheezes noted on exam.   Cessation recommended.  He is set up for CT chest.    5.  Macrocytosis.  Hepatitis and HIV ordered. B12 level normal.    6.  HTN. BP elevated at 151/110.  Pt denies history of HTN.  Follow-up with PCP.    Current Status:  Pt is seen today for follow-up.  He is here to go over labs.     Problem List Patient Active Problem List   Diagnosis Date Noted  . Other pancytopenia ([place]) [G29.528] 05/07/2018  . Macrocytosis [D75.89] 05/07/2018    Past Medical History Past Medical History:  Diagnosis Date  . Tobacco abuse     Past Surgical History History reviewed. No pertinent surgical history.  Family History Family History  Problem Relation Age of Onset  . Heart attack Mother   . Lung cancer Father      Social History  reports that he has been smoking. He has a 40.00 pack-year smoking history. He has never used smokeless tobacco. He reports that he drank alcohol. He reports that he has current or past drug history.  Medications  Current Outpatient Medications:  .  buPROPion (WELLBUTRIN SR) 150 MG 12 hr tablet, TAKE 1 TABLET BY MOUTH ONCE DAILY FOR 4 DAYS. THEN INCREASE TO TWICE DAILY, Disp: , Rfl: 1 .  SYMBICORT 80-4.5 MCG/ACT inhaler, , Disp: , Rfl:  .  tiZANidine (ZANAFLEX) 4 MG tablet, TAKE 1 TABLET BY MOUTH EVERY 6 HOURS AS NEEDED FOR MUSCLE SPASM, Disp: , Rfl: 1 .  traZODone (DESYREL) 50 MG tablet, Take 50 mg by mouth at bedtime., Disp: , Rfl: 1  Allergies Patient has no known allergies.  Review of Systems Review of Systems - Oncology ROS negative   Physical Exam  Vitals Wt Readings from Last 3 Encounters:  07/28/18 185 lb (83.9 kg)  06/06/18 190 lb (86.2 kg)  05/23/18 188 lb 1.6 oz (85.3 kg)   Temp Readings from Last 3 Encounters:  07/28/18 98.1 F (36.7 C) (Oral)  07/15/18 98.6 F (37 C)  06/13/18 97.8 F (36.6 C) (Oral)   BP Readings from Last 3 Encounters:  07/28/18 (!) 151/110  07/15/18 (!) 152/104  06/13/18 (!) 156/116   Pulse Readings from  Last 3 Encounters:  07/28/18 70  07/15/18 77  06/13/18 65   Constitutional: Well-developed, well-nourished, and in no distress.   HENT: Head: Normocephalic and atraumatic.  Mouth/Throat: No oropharyngeal exudate. Mucosa moist. Eyes: Pupils are equal, round, and reactive to light. Conjunctivae are normal. No scleral icterus.  Neck: Normal range of motion. Neck supple. No JVD present.  Cardiovascular: Normal rate, regular rhythm and normal heart sounds.  Exam reveals no gallop and no friction rub.   No murmur heard. Pulmonary/Chest: Effort normal and breath sounds normal. No respiratory distress. No wheezes.No rales.  Abdominal: Soft. Bowel sounds are normal. No distension. There is no tenderness. There is no guarding.  Musculoskeletal: No edema or tenderness.  Lymphadenopathy:No cervical, axillary or supraclavicular adenopathy.  Neurological: Alert and oriented to person, place, and time. No cranial nerve deficit.  Skin: Skin is warm and dry. No rash noted. No erythema. No pallor.  Psychiatric: Affect and judgment normal.   Labs No visits with results within 3 Day(s) from this visit.  Latest known visit with results is:  Appointment on 07/23/2018  Component Date Value Ref Range Status  . WBC 07/23/2018 3.0* 4.0 - 10.5 K/uL Final  . RBC 07/23/2018 3.67* 4.22 - 5.81 MIL/uL Final  . Hemoglobin 07/23/2018 13.0  13.0 - 17.0 g/dL Final  . HCT 07/23/2018 37.7* 39.0 - 52.0 % Final  . MCV 07/23/2018 102.7* 78.0 - 100.0 fL Final  . MCH 07/23/2018 35.4* 26.0 - 34.0 pg Final  . MCHC 07/23/2018 34.5  30.0 - 36.0 g/dL Final  . RDW 07/23/2018 13.9  11.5 - 15.5 % Final  . Platelets 07/23/2018 82* 150 - 400 K/uL Final   Comment: PLATELET COUNT CONFIRMED BY SMEAR SPECIMEN CHECKED FOR CLOTS   . Neutrophils Relative % 07/23/2018 52  % Final  . Neutro Abs 07/23/2018 1.5* 1.7 - 7.7 K/uL Final  . Lymphocytes Relative 07/23/2018 46  % Final  . Lymphs Abs 07/23/2018 1.4  0.7 - 4.0 K/uL Final  .  Monocytes Relative 07/23/2018 1  % Final  . Monocytes Absolute 07/23/2018 0.0* 0.1 - 1.0 K/uL Final  . Eosinophils Relative 07/23/2018 1  % Final  . Eosinophils Absolute 07/23/2018 0.0  0.0 - 0.7 K/uL Final  . Basophils Relative 07/23/2018 0  % Final  . Basophils Absolute 07/23/2018 0.0  0.0 - 0.1 K/uL Final   Performed at [facility], [address], [place], [place] [address]  . Vitamin B-12 07/23/2018 290  180 - 914 pg/mL Final   Comment: (NOTE) This assay is not validated for testing neonatal or myeloproliferative syndrome specimens for Vitamin B12 levels. Performed at [facility], [address], [place], [place] [address]   . LDH 07/23/2018 133  98 - 192 U/L Final   Performed at [facility]  [address] [place], [place] [address]  . Sodium 07/23/2018 138  135 - 145 mmol/L Final  . Potassium 07/23/2018 3.9  3.5 - 5.1 mmol/L Final  . Chloride 07/23/2018 105  98 - 111 mmol/L Final  . CO2 07/23/2018 26  22 - 32 mmol/L Final  . Glucose, Bld 07/23/2018 148* 70 - 99 mg/dL Final  . BUN 07/23/2018 15  6 - 20 mg/dL Final  . Creatinine, Ser 07/23/2018 1.11  0.61 - 1.24 mg/dL Final  . Calcium 07/23/2018 8.8* 8.9 - 10.3 mg/dL Final  . Total Protein 07/23/2018 7.6  6.5 - 8.1 g/dL Final  . Albumin 07/23/2018 3.6  3.5 - 5.0 g/dL Final  . AST 07/23/2018 48* 15 - 41 U/L Final  . ALT 07/23/2018 49* 0 - 44 U/L Final  . Alkaline Phosphatase 07/23/2018 46  38 - 126 U/L Final  . Total Bilirubin 07/23/2018 0.7  0.3 - 1.2 mg/dL Final  . GFR calc non Af Amer 07/23/2018 >60  >60 mL/min Final  . GFR calc Af Amer 07/23/2018 >60  >60 mL/min Final   Comment: (NOTE) The eGFR has been calculated using the CKD EPI equation. This calculation has not been validated in all clinical situations. eGFR's persistently <60 mL/min signify possible Chronic Kidney Disease.   Georgiann Hahn gap 07/23/2018 7  5 - 15 Final   Performed at [facility], [address], [place], [place] [address]     Pathology Orders Placed  This Encounter  Procedures  . CT Abdomen Pelvis W Contrast    Standing Status:   Future    Standing Expiration Date:   07/28/2019    Order Specific Question:   ** REASON FOR EXAM (FREE TEXT)    Answer:   thrombocytopenia    Order Specific Question:   If indicated for the ordered procedure, I authorize the administration of contrast media per Radiology protocol    Answer:   Yes    Order Specific Question:   Preferred imaging location?    Answer:   [facility]    Order Specific Question:   Is Oral Contrast requested for this exam?    Answer:   Yes, Per Radiology protocol    Order Specific Question:   Radiology Contrast Protocol - do NOT remove file path    Answer:   \\[email or web address].pdf  . CT CHEST W CONTRAST    Standing Status:   Future    Standing Expiration Date:   07/28/2019    Order Specific Question:   If indicated for the ordered procedure, I authorize the administration of contrast media per Radiology protocol    Answer:   Yes    Order Specific Question:   Preferred imaging location?    Answer:   [facility]    Order Specific Question:   Radiology Contrast Protocol - do NOT remove file path    Answer:   \\[email or web address]  . CBC with Differential/Platelet    Standing Status:   Future    Standing Expiration Date:   07/29/2019  . Comprehensive metabolic panel    Standing Status:   Future    Standing Expiration Date:   07/29/2019  . Lactate dehydrogenase    Standing Status:   Future    Standing Expiration Date:   07/29/2019  . Hepatitis B surface antibody    Standing Status:   Future    Standing Expiration Date:   07/29/2019  . Hepatitis B surface antigen    Standing Status:   Future  Standing Expiration Date:   07/29/2019  . Hepatitis C RNA quantitative    Standing Status:   Future    Standing Expiration Date:   07/29/2019  . Hepatitis panel, acute    Standing Status:   Future    Standing Expiration Date:    07/29/2019  . HIV antibody (with reflex)    Standing Status:   Future    Standing Expiration Date:   07/29/2019       Zoila Shutter MD

## 2018-08-13 ENCOUNTER — Other Ambulatory Visit (HOSPITAL_COMMUNITY): Payer: Managed Care, Other (non HMO)

## 2018-08-13 ENCOUNTER — Ambulatory Visit (HOSPITAL_COMMUNITY)
Admission: RE | Admit: 2018-08-13 | Discharge: 2018-08-13 | Disposition: A | Discharge status: Home / Self Care | Payer: Managed Care, Other (non HMO) | Source: Ambulatory Visit | Attending: Internal Medicine | Admitting: Internal Medicine

## 2018-08-13 DIAGNOSIS — K760 Fatty (change of) liver, not elsewhere classified: Secondary | ICD-10-CM | POA: Insufficient documentation

## 2018-08-13 DIAGNOSIS — R634 Abnormal weight loss: Secondary | ICD-10-CM

## 2018-08-13 DIAGNOSIS — Z720 Tobacco use: Secondary | ICD-10-CM

## 2018-08-13 DIAGNOSIS — I700 Atherosclerosis of aorta: Secondary | ICD-10-CM | POA: Insufficient documentation

## 2018-08-13 DIAGNOSIS — R911 Solitary pulmonary nodule: Secondary | ICD-10-CM | POA: Diagnosis not present

## 2018-08-13 DIAGNOSIS — R161 Splenomegaly, not elsewhere classified: Secondary | ICD-10-CM | POA: Diagnosis not present

## 2018-08-13 MED ORDER — IOPAMIDOL (ISOVUE-300) INJECTION 61%
100.0000 mL | Freq: Once | INTRAVENOUS | Status: AC | PRN
  Administered 2018-08-13: 100 mL via INTRAVENOUS

## 2018-08-14 ENCOUNTER — Inpatient Hospital Stay (HOSPITAL_COMMUNITY): Payer: Managed Care, Other (non HMO) | Attending: Internal Medicine

## 2018-08-14 ENCOUNTER — Ambulatory Visit (HOSPITAL_COMMUNITY): Payer: Managed Care, Other (non HMO)

## 2018-08-14 DIAGNOSIS — K760 Fatty (change of) liver, not elsewhere classified: Secondary | ICD-10-CM | POA: Insufficient documentation

## 2018-08-14 DIAGNOSIS — D61818 Other pancytopenia: Secondary | ICD-10-CM | POA: Diagnosis present

## 2018-08-14 DIAGNOSIS — R911 Solitary pulmonary nodule: Secondary | ICD-10-CM | POA: Diagnosis not present

## 2018-08-14 DIAGNOSIS — D7589 Other specified diseases of blood and blood-forming organs: Secondary | ICD-10-CM | POA: Diagnosis not present

## 2018-08-14 DIAGNOSIS — R634 Abnormal weight loss: Secondary | ICD-10-CM

## 2018-08-14 DIAGNOSIS — E538 Deficiency of other specified B group vitamins: Secondary | ICD-10-CM | POA: Diagnosis not present

## 2018-08-14 DIAGNOSIS — R7989 Other specified abnormal findings of blood chemistry: Secondary | ICD-10-CM | POA: Diagnosis not present

## 2018-08-14 DIAGNOSIS — R760 Raised antibody titer: Secondary | ICD-10-CM | POA: Diagnosis not present

## 2018-08-14 DIAGNOSIS — R161 Splenomegaly, not elsewhere classified: Secondary | ICD-10-CM | POA: Diagnosis not present

## 2018-08-14 DIAGNOSIS — Z720 Tobacco use: Secondary | ICD-10-CM

## 2018-08-14 LAB — COMPREHENSIVE METABOLIC PANEL
ALT: 63 U/L — ABNORMAL HIGH (ref 0–44)
AST: 61 U/L — ABNORMAL HIGH (ref 15–41)
Albumin: 3.6 g/dL (ref 3.5–5.0)
Alkaline Phosphatase: 44 U/L (ref 38–126)
Anion gap: 7 (ref 5–15)
BUN: 21 mg/dL — ABNORMAL HIGH (ref 6–20)
CO2: 24 mmol/L (ref 22–32)
Calcium: 8.5 mg/dL — ABNORMAL LOW (ref 8.9–10.3)
Chloride: 106 mmol/L (ref 98–111)
Creatinine, Ser: 1.1 mg/dL (ref 0.61–1.24)
GFR calc Af Amer: >60 mL/min (ref >60)
GFR calc non Af Amer: >60 mL/min (ref >60)
Glucose, Bld: 109 mg/dL — ABNORMAL HIGH (ref 70–99)
Potassium: 4.2 mmol/L (ref 3.5–5.1)
Sodium: 137 mmol/L (ref 135–145)
Total Bilirubin: 0.6 mg/dL (ref 0.3–1.2)
Total Protein: 7.9 g/dL (ref 6.5–8.1)

## 2018-08-14 LAB — CBC WITH DIFFERENTIAL/PLATELET
Basophils Absolute: 0 10*3/uL (ref 0.0–0.1)
Basophils Relative: 0 %
Eosinophils Absolute: 0.1 10*3/uL (ref 0.0–0.7)
Eosinophils Relative: 3 %
HCT: 40.3 % (ref 39.0–52.0)
Hemoglobin: 13.8 g/dL (ref 13.0–17.0)
Lymphocytes Relative: 60 %
Lymphs Abs: 1.7 10*3/uL (ref 0.7–4.0)
MCH: 35.4 pg — ABNORMAL HIGH (ref 26.0–34.0)
MCHC: 34.2 g/dL (ref 30.0–36.0)
MCV: 103.3 fL — ABNORMAL HIGH (ref 78.0–100.0)
Monocytes Absolute: 0 10*3/uL — ABNORMAL LOW (ref 0.1–1.0)
Monocytes Relative: 0 %
Neutro Abs: 1.1 10*3/uL — ABNORMAL LOW (ref 1.7–7.7)
Neutrophils Relative %: 37 %
Platelets: 101 10*3/uL — ABNORMAL LOW (ref 150–400)
RBC: 3.9 MIL/uL — ABNORMAL LOW (ref 4.22–5.81)
RDW: 13.9 % (ref 11.5–15.5)
WBC: 2.9 10*3/uL — ABNORMAL LOW (ref 4.0–10.5)

## 2018-08-14 LAB — LACTATE DEHYDROGENASE: LDH: 130 U/L (ref 98–192)

## 2018-08-15 ENCOUNTER — Other Ambulatory Visit: Payer: Self-pay

## 2018-08-15 ENCOUNTER — Encounter (HOSPITAL_COMMUNITY): Payer: Self-pay | Admitting: Hematology

## 2018-08-15 ENCOUNTER — Inpatient Hospital Stay (HOSPITAL_COMMUNITY): Payer: Managed Care, Other (non HMO)

## 2018-08-15 ENCOUNTER — Ambulatory Visit (HOSPITAL_COMMUNITY): Payer: Managed Care, Other (non HMO) | Admitting: Hematology

## 2018-08-15 ENCOUNTER — Ambulatory Visit (HOSPITAL_COMMUNITY): Payer: Managed Care, Other (non HMO)

## 2018-08-15 ENCOUNTER — Inpatient Hospital Stay (HOSPITAL_COMMUNITY): Payer: Managed Care, Other (non HMO) | Admitting: Hematology

## 2018-08-15 VITALS — BP 148/92 | HR 64 | Temp 97.7°F | Resp 16 | Wt 186.3 lb

## 2018-08-15 DIAGNOSIS — K760 Fatty (change of) liver, not elsewhere classified: Secondary | ICD-10-CM | POA: Diagnosis not present

## 2018-08-15 DIAGNOSIS — E538 Deficiency of other specified B group vitamins: Secondary | ICD-10-CM

## 2018-08-15 DIAGNOSIS — R7989 Other specified abnormal findings of blood chemistry: Secondary | ICD-10-CM | POA: Diagnosis not present

## 2018-08-15 DIAGNOSIS — D61818 Other pancytopenia: Secondary | ICD-10-CM

## 2018-08-15 DIAGNOSIS — D7589 Other specified diseases of blood and blood-forming organs: Secondary | ICD-10-CM

## 2018-08-15 DIAGNOSIS — R760 Raised antibody titer: Secondary | ICD-10-CM

## 2018-08-15 DIAGNOSIS — R768 Other specified abnormal immunological findings in serum: Secondary | ICD-10-CM

## 2018-08-15 DIAGNOSIS — R161 Splenomegaly, not elsewhere classified: Secondary | ICD-10-CM

## 2018-08-15 DIAGNOSIS — R911 Solitary pulmonary nodule: Secondary | ICD-10-CM

## 2018-08-15 LAB — HEPATITIS PANEL, ACUTE
HCV Ab: >11 s/co ratio — ABNORMAL HIGH (ref 0.0–0.9)
Hep A IgM: NEGATIVE
Hep B C IgM: NEGATIVE
Hepatitis B Surface Ag: NEGATIVE

## 2018-08-15 LAB — HEPATITIS B SURFACE ANTIBODY,QUALITATIVE: Hep B S Ab: NONREACTIVE

## 2018-08-15 LAB — HEPATITIS B SURFACE ANTIGEN: Hepatitis B Surface Ag: NEGATIVE

## 2018-08-15 LAB — HIV ANTIBODY (ROUTINE TESTING W REFLEX): HIV Screen 4th Generation wRfx: NONREACTIVE

## 2018-08-15 MED ORDER — CYANOCOBALAMIN 1000 MCG/ML IJ SOLN
INTRAMUSCULAR | Status: AC
  Filled 2018-08-15: qty 1

## 2018-08-15 MED ORDER — CYANOCOBALAMIN 1000 MCG/ML IJ SOLN: 1000.0000 ug | Freq: Once | INTRAMUSCULAR | Status: AC

## 2018-08-15 MED ORDER — CYANOCOBALAMIN 1000 MCG/ML IJ SOLN
1000.0000 ug | Freq: Once | INTRAMUSCULAR | Status: AC
  Administered 2018-08-15: 1000 ug via INTRAMUSCULAR

## 2018-08-15 NOTE — Patient Instructions (Signed)
[facility] at [facility] Discharge Instructions  Received Vit B12 injection today. Follow-up as scheduled. Call clinic for any questions or concerns   Thank you for choosing [facility] at [facility] to provide your oncology and hematology care.  To afford each patient quality time with our provider, please arrive at least 15 minutes before your scheduled appointment time.   If you have a lab appointment with the Cancer Center please come in thru the  Main Entrance and check in at the main information desk  You need to re-schedule your appointment should you arrive 10 or more minutes late.  We strive to give you quality time with our providers, and arriving late affects you and other patients whose appointments are after yours.  Also, if you no show three or more times for appointments you may be dismissed from the clinic at the providers discretion.     Again, thank you for choosing [facility].  Our hope is that these requests will decrease the amount of time that you wait before being seen by our physicians.       _____________________________________________________________  Should you have questions after your visit to  Cancer Center, please contact our office at [phone] between the hours of 8:00 a.m. and 4:30 p.m.  Voicemails left after 4:00 p.m. will not be returned until the following business day.  For prescription refill requests, have your pharmacy contact our office and allow 72 hours.    Cancer Center Support Programs:   > Cancer Support Group  2nd Tuesday of the month 1pm-2pm, Journey Room   

## 2018-08-15 NOTE — Progress Notes (Signed)
Conchita Paris tolerated Vit B12 injection well without complaints or incident. Pt discharged self ambulatory in satisfactory condition

## 2018-08-15 NOTE — Progress Notes (Signed)
[place]an Brady, [place] [address]   CLINIC:  Medical Oncology/Hematology  PCP:  Celene Squibb, MD [place] [address] [phone]   REASON FOR VISIT: Follow-up for pancytopenia and macrocytosis  CURRENT THERAPY: B12 injections weekly started 05/23/18   INTERVAL HISTORY:  Logan Brady 57 y.o. male returns for routine follow-up for pancytopenia and macrocytosis. Patient is here today with family. He is doing well. He has pain in his arms, back, shoulders that he has had for a while. He has no other complaints at this time. His appetite and energy level are good. He denies any nausea,vomiting, or diarrhea. Denies any easy bruising or bleeding.  He will have more blood drawn today and follow up with [place] in 4 weeks for results and treatment plan.   REVIEW OF SYSTEMS:  Review of Systems  Musculoskeletal: Positive for back pain.  Neurological: Positive for extremity weakness.  All other systems reviewed and are negative.    PAST MEDICAL/SURGICAL HISTORY:  Past Medical History:  Diagnosis Date  . Tobacco abuse    History reviewed. No pertinent surgical history.   SOCIAL HISTORY:  Social History   Socioeconomic History  . Marital status: Divorced    Spouse name: Not on file  . Number of children: 4  . Years of education: 57  . Highest education level: High school graduate  Occupational History  . Occupation: maintenance  Social Needs  . Financial resource strain: Not hard at all  . Food insecurity:    Worry: Never true    Inability: Never true  . Transportation needs:    Medical: No    Non-medical: No  Tobacco Use  . Smoking status: Current Every Day Smoker    Packs/day: 1.00    Years: 40.00    Pack years: 40.00  . Smokeless tobacco: Never Used  Substance and Sexual Activity  . Alcohol use: Not Currently  . Drug use: Not Currently    Comment: quit 30 years ago  . Sexual activity: Yes  Lifestyle  . Physical  activity:    Days per week: 2 days    Minutes per session: 30 min  . Stress: Not at all  Relationships  . Social connections:    Talks on phone: More than three times a week    Gets together: More than three times a week    Attends religious service: Never    Active member of club or organization: No    Attends meetings of clubs or organizations: Never    Relationship status: Divorced  . Intimate partner violence:    Fear of current or ex partner: No    Emotionally abused: No    Physically abused: No    Forced sexual activity: No  Other Topics Concern  . Not on file  Social History Narrative  . Not on file    FAMILY HISTORY:  Family History  Problem Relation Age of Onset  . Heart attack Mother   . Lung cancer Father     CURRENT MEDICATIONS:  Outpatient Encounter Medications as of 08/15/2018  Medication Sig  . buPROPion (WELLBUTRIN SR) 150 MG 12 hr tablet TAKE 1 TABLET BY MOUTH ONCE DAILY FOR 4 DAYS. THEN INCREASE TO TWICE DAILY  . SYMBICORT 80-4.5 MCG/ACT inhaler   . tiZANidine (ZANAFLEX) 4 MG tablet TAKE 1 TABLET BY MOUTH EVERY 6 HOURS AS NEEDED FOR MUSCLE SPASM  . traZODone (DESYREL) 50 MG tablet Take 50 mg  by mouth at bedtime.   No facility-administered encounter medications on file as of 08/15/2018.     ALLERGIES:  No Known Allergies   PHYSICAL EXAM:  ECOG Performance status: 1  Vitals:   08/15/18 0958  BP: (!) 148/92  Pulse: 64  Resp: 16  Temp: 97.7 F (36.5 C)  SpO2: 100%   Filed Weights   08/15/18 0958  Weight: 186 lb 4.8 oz (84.5 kg)    Physical Exam  Constitutional: He is oriented to person, place, and time. He appears well-developed and well-nourished.  Musculoskeletal: Normal range of motion.  Neurological: He is alert and oriented to person, place, and time.  Skin: Skin is warm and dry.  Psychiatric: He has a normal mood and affect. His behavior is normal. Judgment and thought content normal.     LABORATORY DATA:  I have reviewed the  labs as listed.  CBC    Component Value Date/Time   WBC 2.9 (L) 08/14/2018 0936   RBC 3.90 (L) 08/14/2018 0936   HGB 13.8 08/14/2018 0936   HCT 40.3 08/14/2018 0936   PLT 101 (L) 08/14/2018 0936   MCV 103.3 (H) 08/14/2018 0936   MCH 35.4 (H) 08/14/2018 0936   MCHC 34.2 08/14/2018 0936   RDW 13.9 08/14/2018 0936   LYMPHSABS 1.7 08/14/2018 0936   MONOABS 0.0 (L) 08/14/2018 0936   EOSABS 0.1 08/14/2018 0936   BASOSABS 0.0 08/14/2018 0936   CMP Latest Ref Rng & Units 08/14/2018 07/23/2018 05/07/2018  Glucose 70 - 99 mg/dL 109(H) 148(H) 102(H)  BUN 6 - 20 mg/dL 21(H) 15 14  Creatinine 0.61 - 1.24 mg/dL 1.10 1.11 0.95  Sodium 135 - 145 mmol/L 137 138 140  Potassium 3.5 - 5.1 mmol/L 4.2 3.9 4.2  Chloride 98 - 111 mmol/L 106 105 107  CO2 22 - 32 mmol/L 24 26 27   Calcium 8.9 - 10.3 mg/dL 8.5(L) 8.8(L) 8.8(L)  Total Protein 6.5 - 8.1 g/dL 7.9 7.6 7.7  Total Bilirubin 0.3 - 1.2 mg/dL 0.6 0.7 0.4  Alkaline Phos 38 - 126 U/L 44 46 51  AST 15 - 41 U/L 61(H) 48(H) 57(H)  ALT 0 - 44 U/L 63(H) 49(H) 55(H)       DIAGNOSTIC IMAGING:  I have independently reviewed images of the CT scan of the abdomen and pelvis dated 08/13/2018 and discussed with the patient.     ASSESSMENT & PLAN:   Other pancytopenia ([place]) 1.  Pancytopenia: -CBC on 04/23/2018 shows white count of 2.4, platelet count of 88 with an MCV of 107. -This was repeated on 05/01/2018 when the white count was 2.9, platelet count of 107 with a normal differential. -Patient reports throat infection, was seen at his PMDs office on 04/10/2018 and was treated with Z-Pak and nystatin. - He has low B12 for which he is taking B12 tablets.  His last level has improved. - He has persistent elevation of LFTs. -I have reviewed results of the CT scan of the abdomen and pelvis dated 08/13/2018 which shows fatty infiltration of the liver with mild splenomegaly measuring 13.7 cm.  8 mm right lower lobe nodule was seen.  Noncontrast CT at 6 to 12  months was recommended. - Hepatitis panel shows elevated hep C antibody.  I will order hep C RNA PCR with reflex genotype.  If it is positive, will make a referral to hepatology for treatment.  2.  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      Orders  placed this encounter:  Orders Placed This Encounter  Procedures  . HCV RNA BY PCR, QN RFX GENO      Derek Jack, MD [place] [phone]

## 2018-08-15 NOTE — Patient Instructions (Signed)
[place] at [facility] Discharge Instructions  Follow up in 4 weeks for results.    Thank you for choosing [place] at [facility] to provide your oncology and hematology care.  To afford each patient quality time with our provider, please arrive at least 15 minutes before your scheduled appointment time.   If you have a lab appointment with the [facility] please come in thru the  Main Entrance and check in at the main information desk  You need to re-schedule your appointment should you arrive 10 or more minutes late.  We strive to give you quality time with our providers, and arriving late affects you and other patients whose appointments are after yours.  Also, if you no show three or more times for appointments you may be dismissed from the clinic at the providers discretion.     Again, thank you for choosing [facility].  Our hope is that these requests will decrease the amount of time that you wait before being seen by our physicians.       _____________________________________________________________  Should you have questions after your visit to [facility], please contact our office at [phone] between the hours of 8:00 a.m. and 4:30 p.m.  Voicemails left after 4:00 p.m. will not be returned until the following business day.  For prescription refill requests, have your pharmacy contact our office and allow 72 hours.    Cancer Center Support Programs:   > Cancer Support Group  2nd Tuesday of the month 1pm-2pm, Journey Room

## 2018-08-15 NOTE — Assessment & Plan Note (Signed)
1.  Pancytopenia: -CBC on 04/23/2018 shows white count of 2.4, platelet count of 88 with an MCV of 107. -This was repeated on 05/01/2018 when the white count was 2.9, platelet count of 107 with a normal differential. -Patient reports throat infection, was seen at his PMDs office on 04/10/2018 and was treated with Z-Pak and nystatin. - He has low B12 for which he is taking B12 tablets.  His last level has improved. - He has persistent elevation of LFTs. -I have reviewed results of the CT scan of the abdomen and pelvis dated 08/13/2018 which shows fatty infiltration of the liver with mild splenomegaly measuring 13.7 cm.  8 mm right lower lobe nodule was seen.  Noncontrast CT at 6 to 12 months was recommended. - Hepatitis panel shows elevated hep C antibody.  I will order hep C RNA PCR with reflex genotype.  If it is positive, will make a referral to hepatology for treatment.  2.  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

## 2018-08-18 LAB — HCV RNA BY PCR, QN RFX GENO
HCV RNA Qnt(log copy/mL): 6.763 log10 IU/mL
HepC Qn: 5790000 IU/mL

## 2018-08-18 LAB — HEPATITIS C GENOTYPE: Hepatitis C Genotype: 3

## 2018-09-12 ENCOUNTER — Ambulatory Visit (HOSPITAL_COMMUNITY): Payer: Managed Care, Other (non HMO)

## 2018-09-12 ENCOUNTER — Ambulatory Visit (HOSPITAL_COMMUNITY): Payer: Managed Care, Other (non HMO) | Admitting: Hematology

## 2018-12-25 NOTE — Telephone Encounter (Signed)
a 

## 2019-01-16 ENCOUNTER — Ambulatory Visit (HOSPITAL_COMMUNITY)
Admission: RE | Admit: 2019-01-16 | Discharge: 2019-01-16 | Disposition: A | Discharge status: Home / Self Care | Payer: Managed Care, Other (non HMO) | Source: Ambulatory Visit | Attending: Internal Medicine | Admitting: Internal Medicine

## 2019-01-16 ENCOUNTER — Other Ambulatory Visit (HOSPITAL_COMMUNITY): Payer: Self-pay | Admitting: Internal Medicine

## 2019-01-16 DIAGNOSIS — R0789 Other chest pain: Secondary | ICD-10-CM | POA: Diagnosis not present

## 2019-01-19 ENCOUNTER — Encounter (HOSPITAL_COMMUNITY): Payer: Self-pay | Admitting: Emergency Medicine

## 2019-01-19 ENCOUNTER — Emergency Department (HOSPITAL_COMMUNITY)
Admission: EM | Admit: 2019-01-19 | Discharge: 2019-01-20 | Disposition: A | Discharge status: Home / Self Care | Payer: Managed Care, Other (non HMO) | Attending: Emergency Medicine | Admitting: Emergency Medicine

## 2019-01-19 ENCOUNTER — Emergency Department (HOSPITAL_COMMUNITY): Payer: Managed Care, Other (non HMO)

## 2019-01-19 ENCOUNTER — Other Ambulatory Visit: Payer: Self-pay

## 2019-01-19 DIAGNOSIS — R079 Chest pain, unspecified: Secondary | ICD-10-CM | POA: Diagnosis present

## 2019-01-19 DIAGNOSIS — Z79899 Other long term (current) drug therapy: Secondary | ICD-10-CM | POA: Diagnosis not present

## 2019-01-19 DIAGNOSIS — F17200 Nicotine dependence, unspecified, uncomplicated: Secondary | ICD-10-CM | POA: Diagnosis not present

## 2019-01-19 DIAGNOSIS — I10 Essential (primary) hypertension: Secondary | ICD-10-CM | POA: Diagnosis not present

## 2019-01-19 LAB — CBC
HCT: 38.5 % — ABNORMAL LOW (ref 39.0–52.0)
Hemoglobin: 13 g/dL (ref 13.0–17.0)
MCH: 34.5 pg — ABNORMAL HIGH (ref 26.0–34.0)
MCHC: 33.8 g/dL (ref 30.0–36.0)
MCV: 102.1 fL — ABNORMAL HIGH (ref 80.0–100.0)
Platelets: 129 10*3/uL — ABNORMAL LOW (ref 150–400)
RBC: 3.77 MIL/uL — ABNORMAL LOW (ref 4.22–5.81)
RDW: 13 % (ref 11.5–15.5)
WBC: 3.5 10*3/uL — ABNORMAL LOW (ref 4.0–10.5)
nRBC: 0 % (ref 0.0–0.2)

## 2019-01-19 LAB — BASIC METABOLIC PANEL
Anion gap: 7 (ref 5–15)
BUN: 18 mg/dL (ref 6–20)
CO2: 25 mmol/L (ref 22–32)
Calcium: 8.5 mg/dL — ABNORMAL LOW (ref 8.9–10.3)
Chloride: 103 mmol/L (ref 98–111)
Creatinine, Ser: 0.89 mg/dL (ref 0.61–1.24)
GFR calc Af Amer: >60 mL/min (ref >60)
GFR calc non Af Amer: >60 mL/min (ref >60)
Glucose, Bld: 120 mg/dL — ABNORMAL HIGH (ref 70–99)
Potassium: 3.9 mmol/L (ref 3.5–5.1)
Sodium: 135 mmol/L (ref 135–145)

## 2019-01-19 LAB — TROPONIN I: Troponin I: <0.03 ng/mL (ref <0.03)

## 2019-01-19 MED ORDER — IOHEXOL 350 MG/ML SOLN
100.0000 mL | Freq: Once | INTRAVENOUS | Status: AC | PRN
  Administered 2019-01-20: 100 mL via INTRAVENOUS

## 2019-01-19 NOTE — ED Provider Notes (Signed)
[facility] EMERGENCY DEPARTMENT Provider Note   CSN: [id number] Arrival date & time: 01/19/19  2107    History   Chief Complaint Chief Complaint  Patient presents with  . Chest Pain    HPI Logan Brady is a 58 y.o. male.     HPI  58 year old male, has a history of tobacco use, there is a questionable history of a pancytopenia, the patient does not know anything about this.  He states that he takes medications that the doctors given, was recently evaluated for possible flu illness at Dr. Juel Burrow office in town and was sent for a chest x-ray.  This was because he started having fevers chills and chest discomfort with increased coughing on Thursday.  The x-ray was done on Friday and showed no acute processes.  Over the last several days this pain across his chest which she describes as a heavy sensation has been persistent and now has seem to go around the right side to the back.  It is worse with movement, worse with breathing, associated with a temperature of 100.4 but no swelling of the legs.  He reports having exertional symptoms but also symptoms at rest, he denies having any pain with eating.  He has no swelling of the legs, no numbness or tingling.  The patient denies any other risk factors for pulmonary embolism.  Past Medical History:  Diagnosis Date  . Tobacco abuse     Patient Active Problem List   Diagnosis Date Noted  . 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 Macrocytosis 05/07/2018    History reviewed. No pertinent surgical history.      Home Medications    Prior to Admission medications   Medication Sig Start Date End Date Taking? Authorizing Provider  aspirin-acetaminophen-caffeine (EXCEDRIN MIGRAINE) [phone] MG tablet Take 2 tablets by mouth every 6 (six) hours as needed for headache.   Yes [provider]  buPROPion (WELLBUTRIN SR) 150 MG 12 hr tablet Take 150 mg by mouth 2 (two) times daily.  07/17/18  Yes [provider]  diclofenac  sodium (VOLTAREN) 1 % GEL Apply 2 g topically daily as needed (for pain).  09/03/18  Yes [provider]  meloxicam (MOBIC) 15 MG tablet Take 15 mg by mouth daily. 01/15/19  Yes [provider]  tiZANidine (ZANAFLEX) 4 MG tablet Take 4 mg by mouth every 6 (six) hours as needed for muscle spasms.  05/07/18  Yes [provider]  traZODone (DESYREL) 50 MG tablet Take 50 mg by mouth at bedtime as needed for sleep.  07/17/18  Yes [provider]  lisinopril (PRINIVIL,ZESTRIL) 10 MG tablet Take 1 tablet (10 mg total) by mouth daily. 01/20/19   Noemi Chapel, MD    Family History Family History  Problem Relation Age of Onset  . Heart attack Mother   . Lung cancer Father     Social History Social History   Tobacco Use  . Smoking status: Current Every Day Smoker    Packs/day: 1.00    Years: 40.00    Pack years: 40.00  . Smokeless tobacco: Never Used  Substance Use Topics  . Alcohol use: Not Currently  . Drug use: Not Currently    Comment: quit 30 years ago     Allergies   Ibuprofen   Review of Systems Review of Systems  All other systems reviewed and are negative.    Physical Exam Updated Vital Signs BP (!) 182/112 (BP Location: Right Arm)   Pulse 86  Temp 99.3 F (37.4 C) (Oral)   Resp 20   Ht 1.88 m (6\' 2" )   Wt 83 kg   SpO2 98%   BMI 23.50 kg/m   Physical Exam Vitals signs and nursing note reviewed.  Constitutional:      General: He is not in acute distress.    Appearance: He is well-developed.  HENT:     Head: Normocephalic and atraumatic.     Mouth/Throat:     Pharynx: No oropharyngeal exudate.  Eyes:     General: No scleral icterus.       Right eye: No discharge.        Left eye: No discharge.     Conjunctiva/sclera: Conjunctivae normal.     Pupils: Pupils are equal, round, and reactive to light.  Neck:     Musculoskeletal: Normal range of motion and neck supple.     Thyroid: No thyromegaly.     Vascular: No JVD.    Cardiovascular:     Rate and Rhythm: Normal rate and regular rhythm.     Heart sounds: Normal heart sounds. No murmur. No friction rub. No gallop.   Pulmonary:     Effort: Pulmonary effort is normal. No respiratory distress.     Breath sounds: Normal breath sounds. No wheezing or rales.  Abdominal:     General: Bowel sounds are normal. There is no distension.     Palpations: Abdomen is soft. There is no mass.     Tenderness: There is no abdominal tenderness.  Musculoskeletal: Normal range of motion.        General: No tenderness.  Lymphadenopathy:     Cervical: No cervical adenopathy.  Skin:    General: Skin is warm and dry.     Findings: No erythema or rash.  Neurological:     Mental Status: He is alert.     Coordination: Coordination normal.  Psychiatric:        Behavior: Behavior normal.      ED Treatments / Results  Labs (all labs ordered are listed, but only abnormal results are displayed) Labs Reviewed  BASIC METABOLIC PANEL - Abnormal; Notable for the following components:      Result Value   Glucose, Bld 120 (*)    Calcium 8.5 (*)    All other components within normal limits  CBC - Abnormal; Notable for the following components:   WBC 3.5 (*)    RBC 3.77 (*)    HCT 38.5 (*)    MCV 102.1 (*)    MCH 34.5 (*)    Platelets 129 (*)    All other components within normal limits  TROPONIN I  INFLUENZA PANEL BY PCR (TYPE A & B)    EKG EKG Interpretation  Date/Time:  Monday January 19 2019 21:34:06 EDT Ventricular Rate:  86 PR Interval:  146 QRS Duration: 88 QT Interval:  366 QTC Calculation: 437 R Axis:   -35 Text Interpretation:  Normal sinus rhythm Left axis deviation Abnormal ECG No old tracing to compare Confirmed by Noemi Chapel [phone]) on 01/19/2019 9:35:57 PM   Radiology Dg Chest 2 View  Result Date: 01/19/2019 CLINICAL DATA:  Chest pain and short of breath EXAM: CHEST - 2 VIEW COMPARISON:  01/16/2019 FINDINGS: The heart size and mediastinal contours  are within normal limits. Both lungs are clear. Degenerative changes of the spine. IMPRESSION: No active cardiopulmonary disease. Electronically Signed   By: Donavan Foil M.D.   On: 01/19/2019 22:11    Procedures  Procedures (including critical care time)  Medications Ordered in ED Medications  iohexol (OMNIPAQUE) 350 MG/ML injection 100 mL (has no administration in time range)     Initial Impression / Assessment and Plan / ED Course  I have reviewed the triage vital signs and the nursing notes.  Pertinent labs & imaging results that were available during my care of the patient were reviewed by me and considered in my medical decision making (see chart for details).  Clinical Course as of Jan 20 39  Mon Jan 19, 2019  2329 Patient has a normal heart rate, normal temperature, hypertension is present but no hypoxia.  I have personally looked at the x-ray and find no signs of infiltrate or pneumothorax.  The patient has no pneumonia.  The lab work shows no leukocytosis, no significant dysfunction electrolytes or renal and his troponin is normal as well which is reassuring that given several days of ongoing pain this is not a cardiac source.  This sounds to be more of a bronchitis, will obtain a CT angiogram to rule out PE.   [BM]    Clinical Course User Index [BM] Noemi Chapel, MD       The patient's blood pressure is elevated, his oxygenation is fine without any signs of hypoxia and he is not tachycardic.  I am concerned that the patient may be developing a pneumonia but would also consider influenza despite negative testing at the office on a rapid sample.  We will obtain a formal flu swab as well as a chest x-ray and labs.  The patient does not appear acutely ill.  His EKG here does not show any acute findings to suggest that this is an acute coronary syndrome and he has no risk factors for pulmonary embolism.  He is also had some facial pressure, drainage and is now having a left-sided  bloody nose when he blows mucus out of his nose.  There is no active bleeding at this time.  At the time of change of shift the patient has been informed of his results up to this point, the CT scan is still pending, I feel comfortable signing this patient out to Dr. Tomi Bamberger to follow-up the results and disposition accordingly.  The patient is agreeable and on repeat exam prior to my end of shift the patient has normal vital signs other than hypertension.   Final Clinical Impressions(s) / ED Diagnoses   Final diagnoses:  Nonspecific chest pain  Essential hypertension    ED Discharge Orders         Ordered    lisinopril (PRINIVIL,ZESTRIL) 10 MG tablet  Daily     01/20/19 0038           Noemi Chapel, MD 01/20/19 0041

## 2019-01-19 NOTE — ED Triage Notes (Addendum)
Pt with chest pressure and difficulty breathing since Thursday and new mid back pain that started yesterday. Pt states he was seen by MD on Thursday and given Flu test which was negative.

## 2019-01-20 LAB — INFLUENZA PANEL BY PCR (TYPE A & B)
Influenza A By PCR: NEGATIVE
Influenza B By PCR: NEGATIVE

## 2019-01-20 MED ORDER — LISINOPRIL 10 MG PO TABS: 10.0000 mg | ORAL_TABLET | Freq: Every day | ORAL | 1 refills | Status: DC

## 2019-01-20 NOTE — Discharge Instructions (Addendum)
Please follow-up with your doctor in the next 3 days for a recheck of your blood pressure, start taking lisinopril daily, this is a blood pressure medication that is very safe to take.  It does very occasionally cause swelling of the lips or tongue, if this occurs stop the medication immediately and call your doctor.  We have not found any cause for your symptoms including no signs of heart attack, blood clots or infection.  Please take the medications exactly as prescribed including the medications that you have at home.  See your doctor in 3 days for recheck  Emergency department for severe or worsening symptoms

## 2019-01-20 NOTE — ED Provider Notes (Signed)
Patient left a change of shift to discharge if his CTA is negative.    Ct Angio Chest Pe W And/or Wo Contrast  Result Date: 01/20/2019 CLINICAL DATA:  Chest pain over the last several days now radiating to the right back. EXAM: CT ANGIOGRAPHY CHEST WITH CONTRAST TECHNIQUE: Multidetector CT imaging of the chest was performed using the standard protocol during bolus administration of intravenous contrast. Multiplanar CT image reconstructions and MIPs were obtained to evaluate the vascular anatomy. CONTRAST:  131mL OMNIPAQUE IOHEXOL 350 MG/ML SOLN COMPARISON:  08/13/2018 FINDINGS: Cardiovascular: Good opacification of the central and segmental pulmonary arteries. No focal filling defects. No evidence of significant pulmonary embolus. Normal heart size. No pericardial effusions. Normal caliber thoracic aorta. No aortic dissection. Great vessel origins are patent. Mediastinum/Nodes: Esophagus is decompressed. No significant lymphadenopathy in the chest. Lungs/Pleura: Lungs are clear and expanded. No focal consolidation. No pleural effusions. No pneumothorax. Airways are patent. Upper Abdomen: No acute abnormalities. Musculoskeletal: Degenerative changes in the spine. No destructive bone lesions. Review of the MIP images confirms the above findings. IMPRESSION: 1. No evidence of significant pulmonary embolus. 2. No evidence of active pulmonary disease. Electronically Signed   By: Lucienne Capers M.D.   On: 01/20/2019 01:09      Rolland Porter, MD 01/20/19 [phone]

## 2019-01-27 ENCOUNTER — Ambulatory Visit (INDEPENDENT_AMBULATORY_CARE_PROVIDER_SITE_OTHER): Payer: Managed Care, Other (non HMO) | Admitting: Internal Medicine

## 2019-01-27 ENCOUNTER — Encounter (INDEPENDENT_AMBULATORY_CARE_PROVIDER_SITE_OTHER): Payer: Self-pay | Admitting: *Deleted

## 2019-01-27 ENCOUNTER — Encounter (INDEPENDENT_AMBULATORY_CARE_PROVIDER_SITE_OTHER): Payer: Self-pay | Admitting: Internal Medicine

## 2019-01-27 ENCOUNTER — Other Ambulatory Visit: Payer: Self-pay

## 2019-01-27 VITALS — BP 170/105 | HR 66 | Temp 97.6°F | Ht 74.0 in | Wt 180.4 lb

## 2019-01-27 DIAGNOSIS — B1920 Unspecified viral hepatitis C without hepatic coma: Secondary | ICD-10-CM | POA: Diagnosis not present

## 2019-01-27 HISTORY — DX: Unspecified viral hepatitis C without hepatic coma: B19.20

## 2019-01-27 NOTE — Progress Notes (Signed)
   Subjective:    Patient ID: Logan Brady Reasons, male    DOB: [birth year], 58 y.o.   MRN: [id number]  HPI Referred by Dr. Nevada Crane for Hepatitis C. Diagnosed in October. No hx of IV drugs. Has 5 tattoos. Has never been treated for Hepatitis C.  His appetite is okay. No weight loss.  BMs are moving okay.   08/15/2018 HCV RNA 5,790,000. Genotype 3 08/14/2018 Acute Hepatitis Panel Negative except for HCV AB which was elevated.  08/13/2018 CT abdomen/pelvis with CM: Fatty infiltration of the liver. CBC    Component Value Date/Time   WBC 3.5 (L) 01/19/2019 2141   RBC 3.77 (L) 01/19/2019 2141   HGB 13.0 01/19/2019 2141   HCT 38.5 (L) 01/19/2019 2141   PLT 129 (L) 01/19/2019 2141   MCV 102.1 (H) 01/19/2019 2141   MCH 34.5 (H) 01/19/2019 2141   MCHC 33.8 01/19/2019 2141   RDW 13.0 01/19/2019 2141   LYMPHSABS 1.7 08/14/2018 0936   MONOABS 0.0 (L) 08/14/2018 0936   EOSABS 0.1 08/14/2018 0936   BASOSABS 0.0 08/14/2018 0936     Review of Systems     Objective:   Physical Exam Blood pressure (!) 170/105, pulse 66, temperature 97.6 F (36.4 C), height 6\' 2"  (1.88 m), weight 180 lb 6.4 oz (81.8 kg). Alert and oriented. Skin warm and dry. Oral mucosa is moist.   . Sclera anicteric, conjunctivae is pink. Thyroid not enlarged. No cervical lymphadenopathy. Lungs clear. Heart regular rate and rhythm.  Abdomen is soft. Bowel sounds are positive. No hepatomegaly. No abdominal masses felt. No tenderness.  No edema to lower extremities.         Assessment & Plan:  Hepatitis C. Needs Hepatic, AFP, PT/INR, Hep C genotype, Hep. C quaint, HIV Urine drug screen, US abdomen elast.

## 2019-01-29 ENCOUNTER — Ambulatory Visit (HOSPITAL_COMMUNITY)
Admission: RE | Admit: 2019-01-29 | Discharge: 2019-01-29 | Disposition: A | Discharge status: Home / Self Care | Payer: Managed Care, Other (non HMO) | Source: Ambulatory Visit | Attending: Internal Medicine | Admitting: Internal Medicine

## 2019-01-29 ENCOUNTER — Other Ambulatory Visit: Payer: Self-pay

## 2019-01-29 DIAGNOSIS — B1920 Unspecified viral hepatitis C without hepatic coma: Secondary | ICD-10-CM

## 2019-02-03 LAB — DRUGS OF ABUSE SCREEN W/O ALC, ROUTINE URINE
AMPHETAMINES (1000 ng/mL SCRN): NEGATIVE
BARBITURATES: NEGATIVE
BENZODIAZEPINES: NEGATIVE
COCAINE METABOLITES: NEGATIVE
MARIJUANA MET (50 ng/mL SCRN): NEGATIVE
METHADONE: NEGATIVE
METHAQUALONE: NEGATIVE
OPIATES: NEGATIVE
PHENCYCLIDINE: NEGATIVE
PROPOXYPHENE: NEGATIVE

## 2019-02-03 LAB — PROTIME-INR
INR: 1.1
Prothrombin Time: 11.2 s (ref 9.0–11.5)

## 2019-02-03 LAB — HEPATIC FUNCTION PANEL
AG Ratio: 0.9 (calc) — ABNORMAL LOW (ref 1.0–2.5)
ALT: 61 U/L — ABNORMAL HIGH (ref 9–46)
AST: 60 U/L — ABNORMAL HIGH (ref 10–35)
Albumin: 3.6 g/dL (ref 3.6–5.1)
Alkaline phosphatase (APISO): 50 U/L (ref 35–144)
Bilirubin, Direct: 0.2 mg/dL (ref 0.0–0.2)
Globulin: 4.1 g/dL (calc) — ABNORMAL HIGH (ref 1.9–3.7)
Indirect Bilirubin: 0.4 mg/dL (calc) (ref 0.2–1.2)
Total Bilirubin: 0.6 mg/dL (ref 0.2–1.2)
Total Protein: 7.7 g/dL (ref 6.1–8.1)

## 2019-02-03 LAB — HEPATITIS C GENOTYPE: HCV Genotype: 3

## 2019-02-03 LAB — HEPATITIS C RNA QUANTITATIVE
HCV Quantitative Log: 6.7 Log IU/mL — ABNORMAL HIGH
HCV RNA, PCR, QN: 5050000 IU/mL — ABNORMAL HIGH

## 2019-02-03 LAB — HIV ANTIBODY (ROUTINE TESTING W REFLEX): HIV 1&2 Ab, 4th Generation: NONREACTIVE

## 2019-02-03 LAB — AFP TUMOR MARKER: AFP-Tumor Marker: 10.8 ng/mL — ABNORMAL HIGH (ref <6.1)

## 2019-02-04 ENCOUNTER — Telehealth (INDEPENDENT_AMBULATORY_CARE_PROVIDER_SITE_OTHER): Payer: Self-pay | Admitting: Internal Medicine

## 2019-02-04 NOTE — Telephone Encounter (Signed)
Logan Brady, Epclusa x 12 weeks.

## 2019-02-05 NOTE — Telephone Encounter (Signed)
A PA has been sent to [employer]. Once we have heard from them, we will contact the patient.

## 2019-03-09 ENCOUNTER — Telehealth (INDEPENDENT_AMBULATORY_CARE_PROVIDER_SITE_OTHER): Payer: Self-pay | Admitting: Internal Medicine

## 2019-03-09 NOTE — Telephone Encounter (Signed)
Results left on answering machine. Advised him that we have submitted information to his insurance company.

## 2019-03-09 NOTE — Telephone Encounter (Signed)
Patients sgo called stated he hasn't heard anything from his test results - please call him at [phone]

## 2019-04-23 ENCOUNTER — Other Ambulatory Visit: Payer: Self-pay

## 2019-04-23 ENCOUNTER — Encounter (INDEPENDENT_AMBULATORY_CARE_PROVIDER_SITE_OTHER): Payer: Self-pay | Admitting: Internal Medicine

## 2019-04-23 ENCOUNTER — Ambulatory Visit (INDEPENDENT_AMBULATORY_CARE_PROVIDER_SITE_OTHER): Payer: Managed Care, Other (non HMO) | Admitting: Internal Medicine

## 2019-04-23 VITALS — BP 154/106 | HR 60 | Temp 98.0°F | Ht 74.0 in | Wt 185.7 lb

## 2019-04-23 DIAGNOSIS — B1710 Acute hepatitis C without hepatic coma: Secondary | ICD-10-CM

## 2019-04-23 NOTE — Progress Notes (Signed)
   Subjective:    Patient ID: Logan Brady, male    DOB: [birth year], 58 y.o.   MRN: [id number]  HPI Here today for f/u. Last seen in March of this year. Will be treated with Epclusa x 12 weeks. He tells me he is doing okay. Appetite is okay. No weight loss.  BMs are normal.    He is genotype 3.  01/29/2019 US abdomen with Elast:  F2-F3 Review of Systems Past Medical History:  Diagnosis Date  . Hepatitis C   . Hepatitis C 01/27/2019  . Hypertension   . Tobacco abuse     History reviewed. No pertinent surgical history.  Allergies  Allergen Reactions  . Ibuprofen Hives and Itching    Current Outpatient Medications on File Prior to Visit  Medication Sig Dispense Refill  . aspirin-acetaminophen-caffeine (EXCEDRIN MIGRAINE) 250-250-65 MG tablet Take 2 tablets by mouth every 6 (six) hours as needed for headache.    Marland Kitchen buPROPion (WELLBUTRIN SR) 150 MG 12 hr tablet Take 150 mg by mouth 2 (two) times daily.   1  . diclofenac sodium (VOLTAREN) 1 % GEL Apply 2 g topically daily as needed (for pain).   5  . lisinopril (PRINIVIL,ZESTRIL) 10 MG tablet Take 1 tablet (10 mg total) by mouth daily. 30 tablet 1  . meloxicam (MOBIC) 15 MG tablet Take 15 mg by mouth daily.    Marland Kitchen tiZANidine (ZANAFLEX) 4 MG tablet Take 4 mg by mouth every 6 (six) hours as needed for muscle spasms.   1  . traZODone (DESYREL) 50 MG tablet Take 50 mg by mouth at bedtime as needed for sleep.   1   Current Facility-Administered Medications on File Prior to Visit  Medication Dose Route Frequency Provider Last Rate Last Dose  . cyanocobalamin ((VITAMIN B-12)) injection 1,000 mcg  1,000 mcg Intramuscular Once Derek Jack, MD            Objective:   Physical Exam  Blood pressure (!) 154/106, pulse 60, temperature 98 F (36.7 C), height 6\' 2"  (1.88 m), weight 185 lb 11.2 oz (84.2 kg).   Deferred.       Assessment & Plan:  Hepatitis C. Will have Tammy check on this. Have not heard from insurance company.  No  charge for this visit.

## 2019-04-23 NOTE — Patient Instructions (Signed)
Will call when we hear from your insurance.

## 2019-05-21 ENCOUNTER — Ambulatory Visit: Payer: Self-pay

## 2019-05-21 ENCOUNTER — Other Ambulatory Visit: Payer: Self-pay

## 2019-05-21 ENCOUNTER — Other Ambulatory Visit: Payer: Self-pay | Admitting: Family Medicine

## 2019-05-21 DIAGNOSIS — M545 Low back pain, unspecified: Secondary | ICD-10-CM

## 2019-06-10 ENCOUNTER — Telehealth (INDEPENDENT_AMBULATORY_CARE_PROVIDER_SITE_OTHER): Payer: Self-pay | Admitting: Internal Medicine

## 2019-06-10 NOTE — Telephone Encounter (Signed)
Patient left message wanting to know if his Hep C medication has been approved - please call him at [phone]

## 2019-06-10 NOTE — Telephone Encounter (Signed)
Please call patient regarding Hep C medication - ph# [phone]

## 2019-06-11 NOTE — Telephone Encounter (Signed)
Please let Tammy address this. She is doing the authorization

## 2019-06-11 NOTE — Telephone Encounter (Signed)
Talked with the patient. Called [facility], this is where the patient needs to get his medication from. They have all that they need, they have been waiting for the patient to call and give them permission to send to our office. They provided the following number for the patient to call .[phone]. Once the patient has done so, they will call the office to arrange the medication delivery.  Patient was made aware.

## 2019-08-10 ENCOUNTER — Other Ambulatory Visit (INDEPENDENT_AMBULATORY_CARE_PROVIDER_SITE_OTHER): Payer: Self-pay | Admitting: *Deleted

## 2019-08-10 ENCOUNTER — Telehealth (INDEPENDENT_AMBULATORY_CARE_PROVIDER_SITE_OTHER): Payer: Self-pay | Admitting: *Deleted

## 2019-08-10 DIAGNOSIS — B1710 Acute hepatitis C without hepatic coma: Secondary | ICD-10-CM

## 2019-08-10 NOTE — Telephone Encounter (Signed)
Patient was called and advised that he needed to go and get lab work done today or tomorrow , followed by a OV with [employer]. He verbalized understanding and agrees to go to the lab.  Patient states that he started the Hep C medication about two weeks after he picked it up from our office 06/17/2019. He is uncertain of the exact date. It could be 06/24/19 or 07/01/19.  Patient states on 08/06/2019 that he was on the 4 th pill of the second delivery. Hsi 3 rd delivery is to arrive at our office 08/12/2019.  The following lab orders were put in per [place] ; CBC , C- Met , Hep C RNA Quant.  Office appointment to be made for next week and Mitzie will call the patient with that date and time.

## 2019-08-18 ENCOUNTER — Telehealth (INDEPENDENT_AMBULATORY_CARE_PROVIDER_SITE_OTHER): Payer: Self-pay | Admitting: Nurse Practitioner

## 2019-08-18 NOTE — Telephone Encounter (Signed)
Tammy, pls contact patient to follow up on lab order as he was to have a CBC, CMP an Hep C RNA quant done. thx

## 2019-08-26 NOTE — Telephone Encounter (Signed)
The patient was called and a message was left, asking that he get his lab work done.

## 2019-09-03 IMAGING — DX DG CHEST 2V
2 series · 2 of 2 positions shown · non-contrast
Comparison: Chest CT 08/13/2018

CLINICAL DATA: RIGHT-sided chest wall pain for several days

EXAM:
CHEST - 2 VIEW

[chest pa]
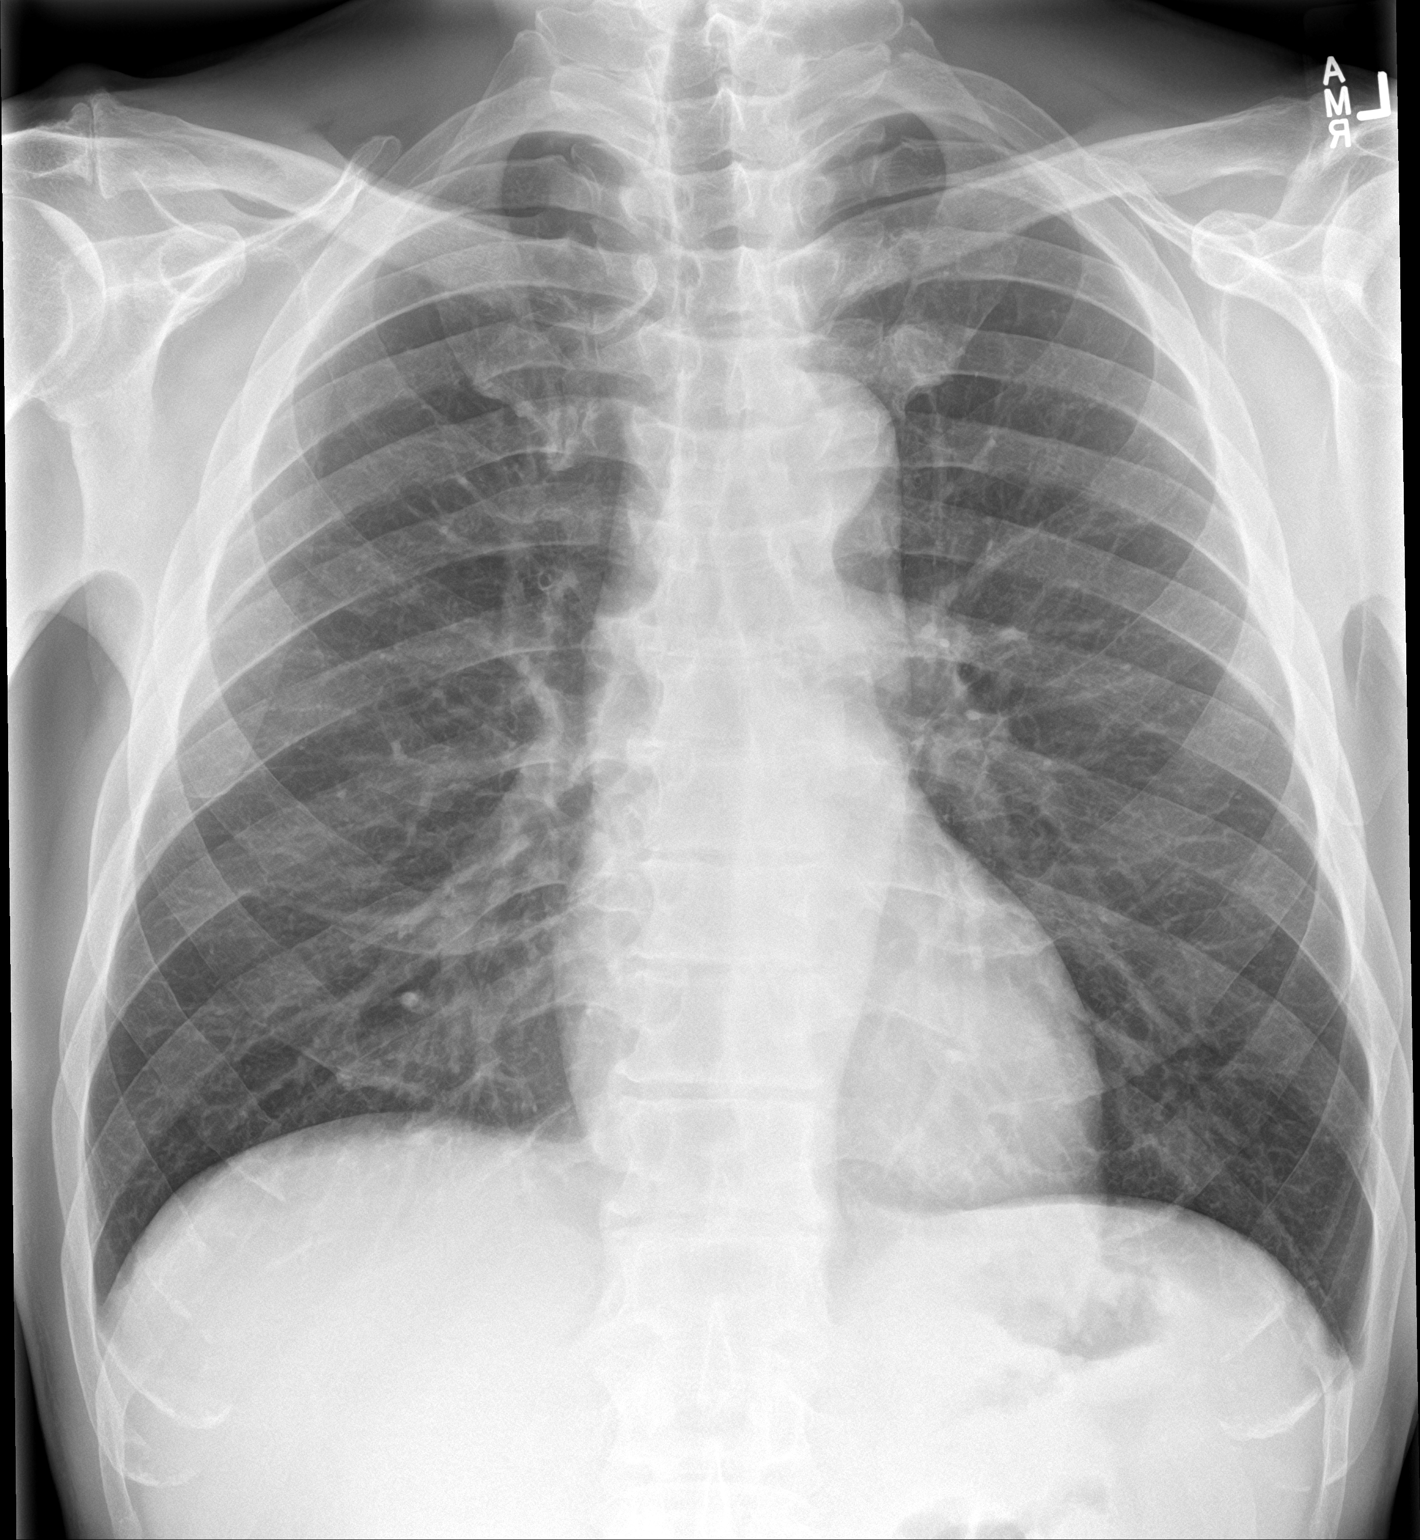

[chest lat]
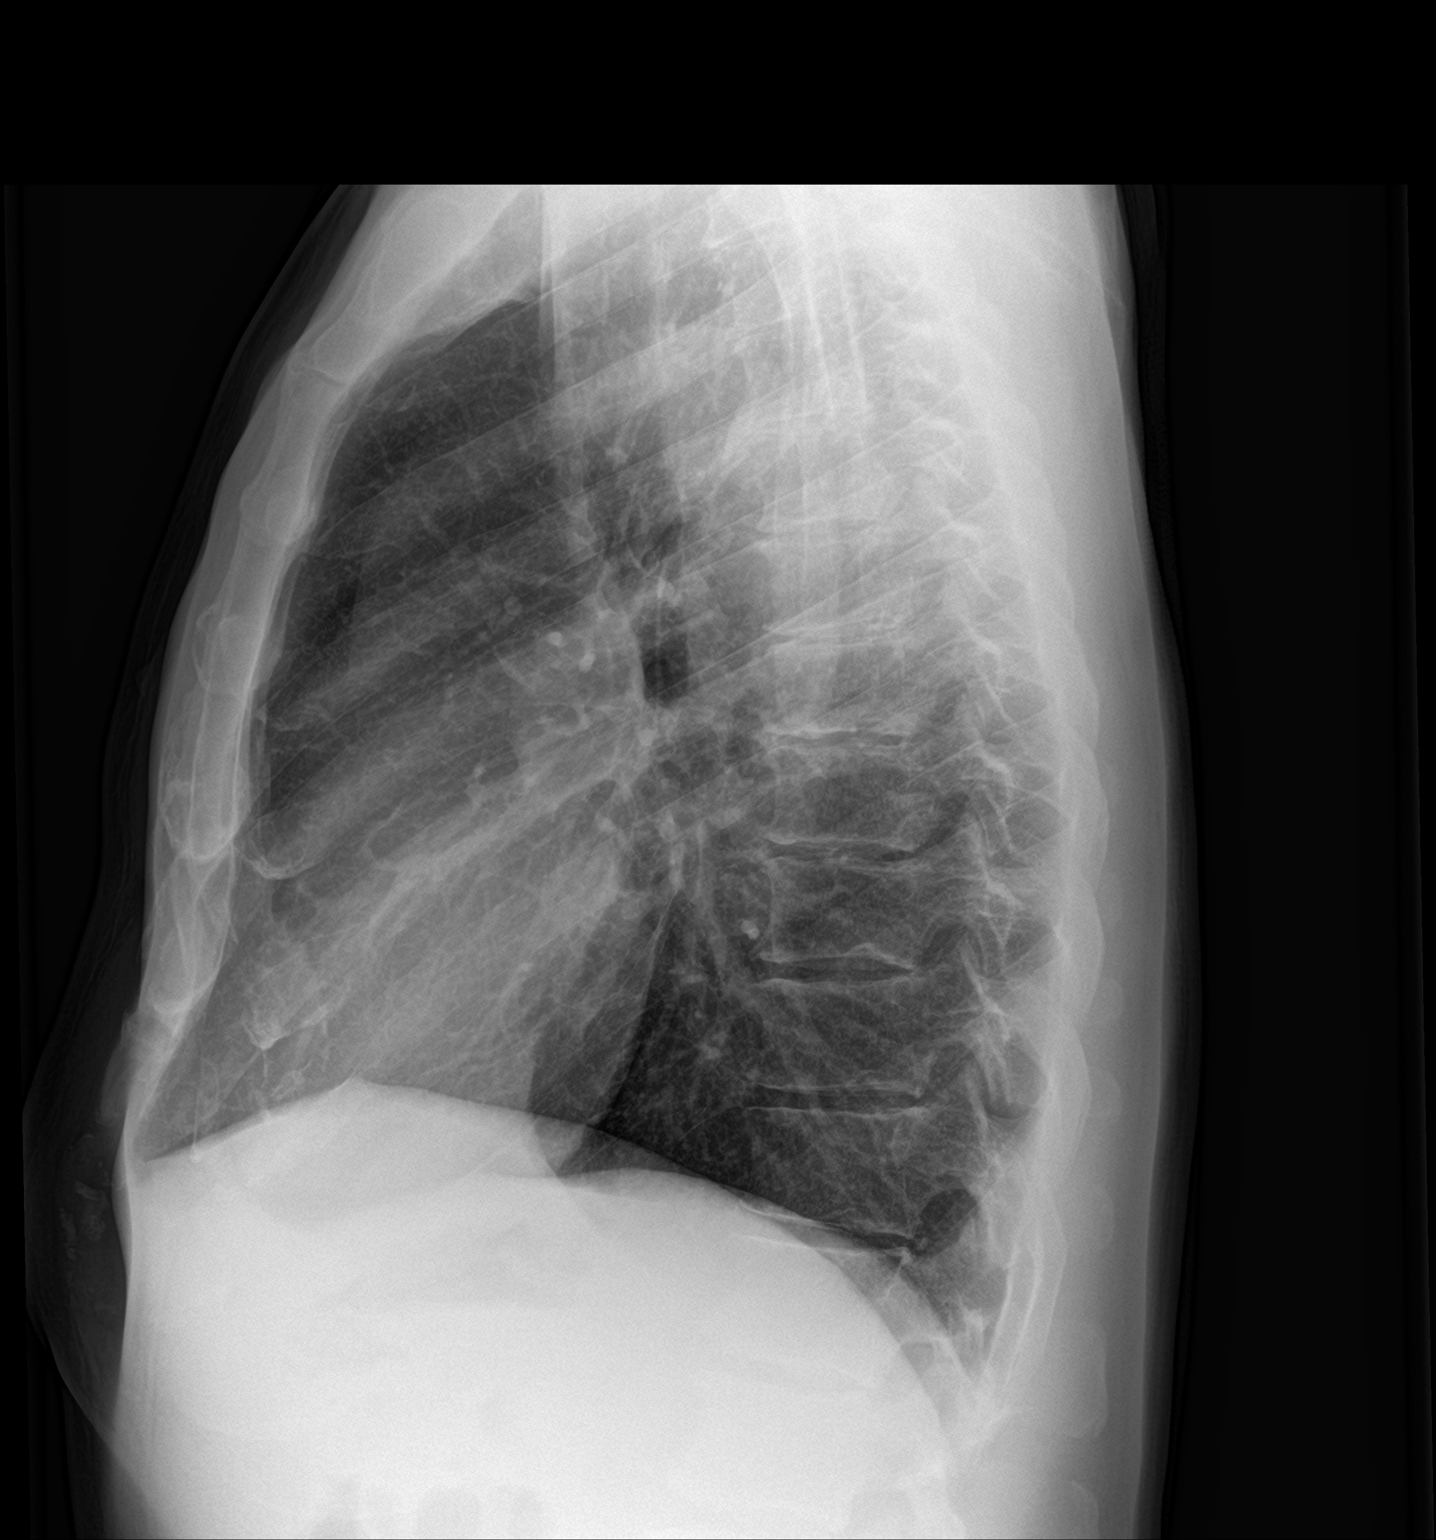

[2 of 2 positions shown; findings below may reference images not displayed]

FINDINGS: Normal mediastinum and cardiac silhouette. Normal pulmonary
vasculature. No evidence of effusion, infiltrate, or pneumothorax.
No acute bony abnormality.
IMPRESSION: No acute cardiopulmonary process.

## 2019-09-06 IMAGING — DX CHEST - 2 VIEW
2 series · 2 of 2 positions shown · non-contrast
Comparison: 01/16/2019

CLINICAL DATA: Chest pain and short of breath

EXAM:
CHEST - 2 VIEW

[chest pa]
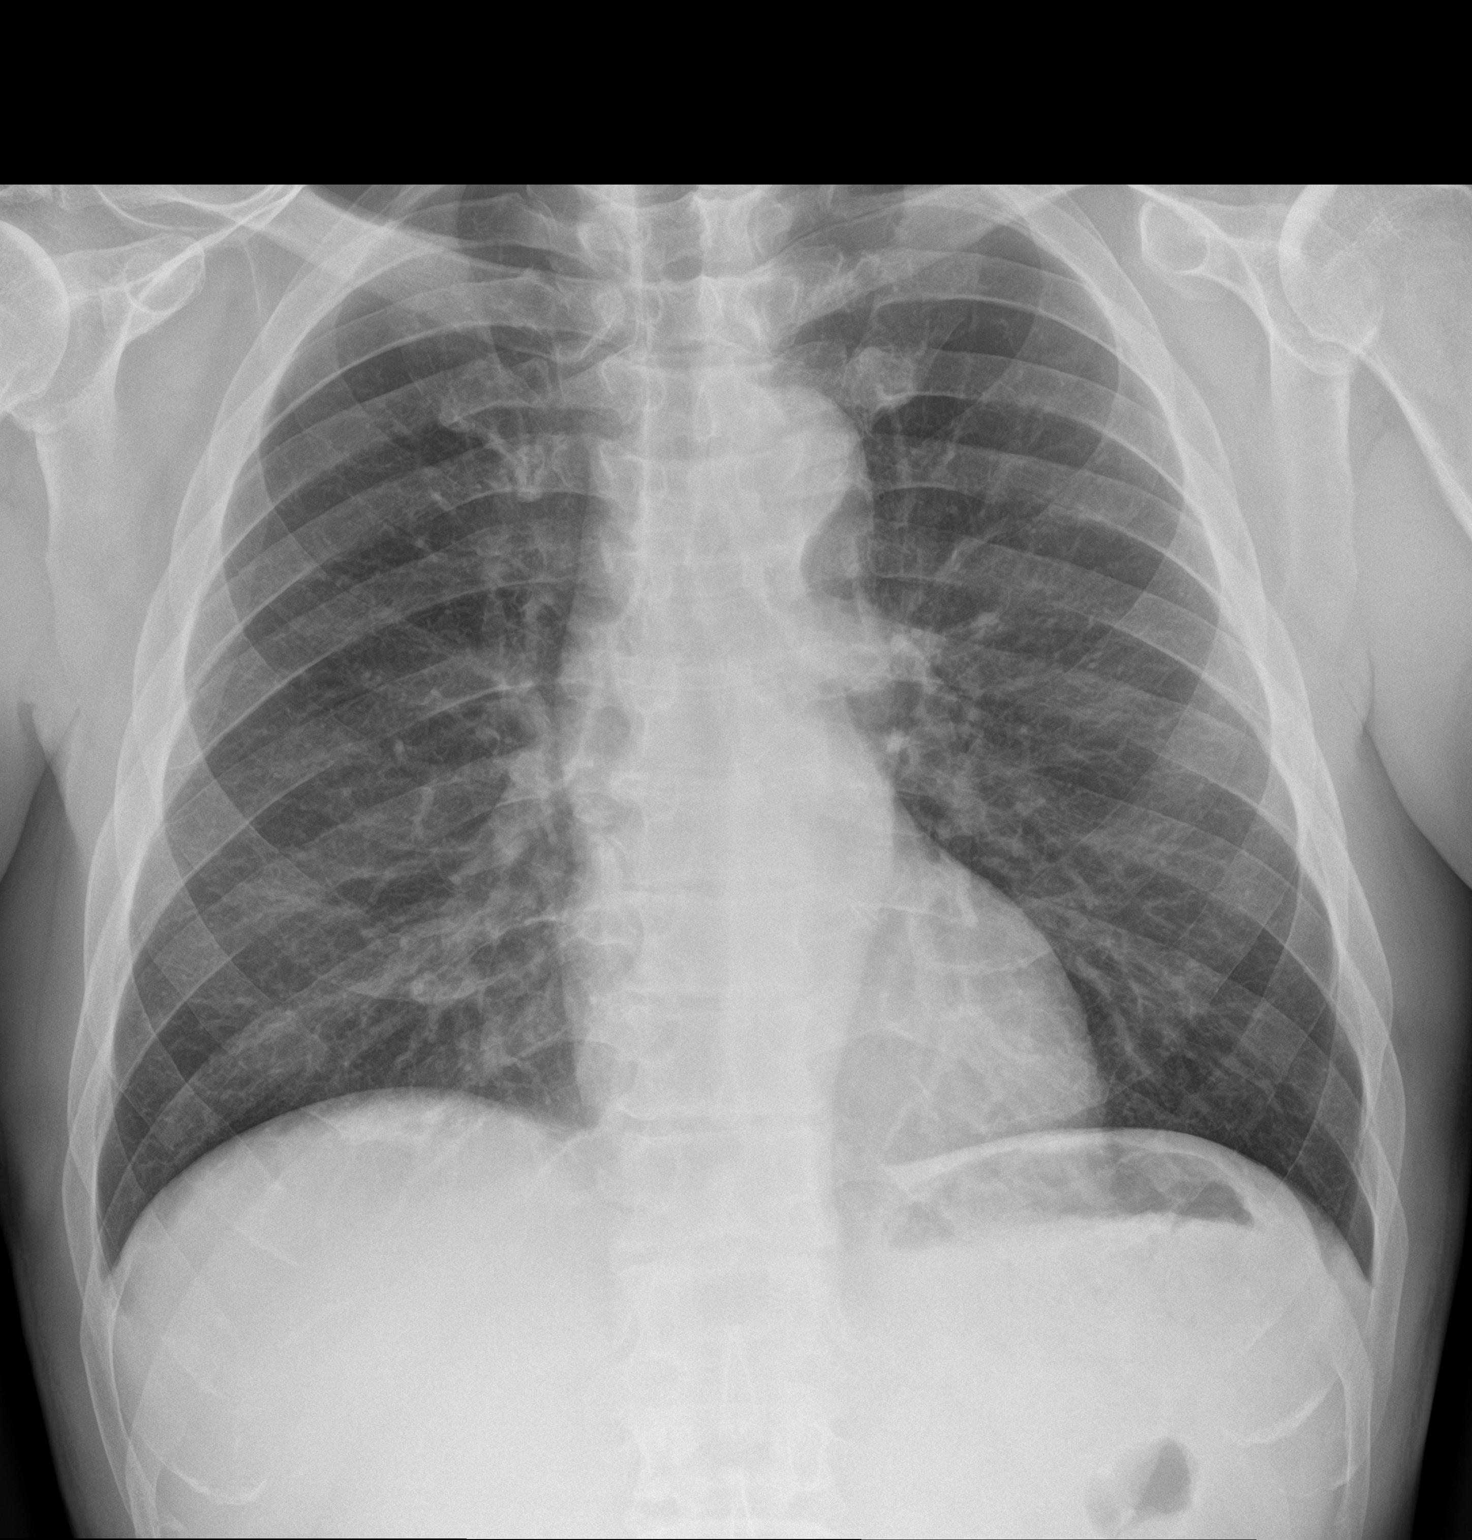

[chest lat]
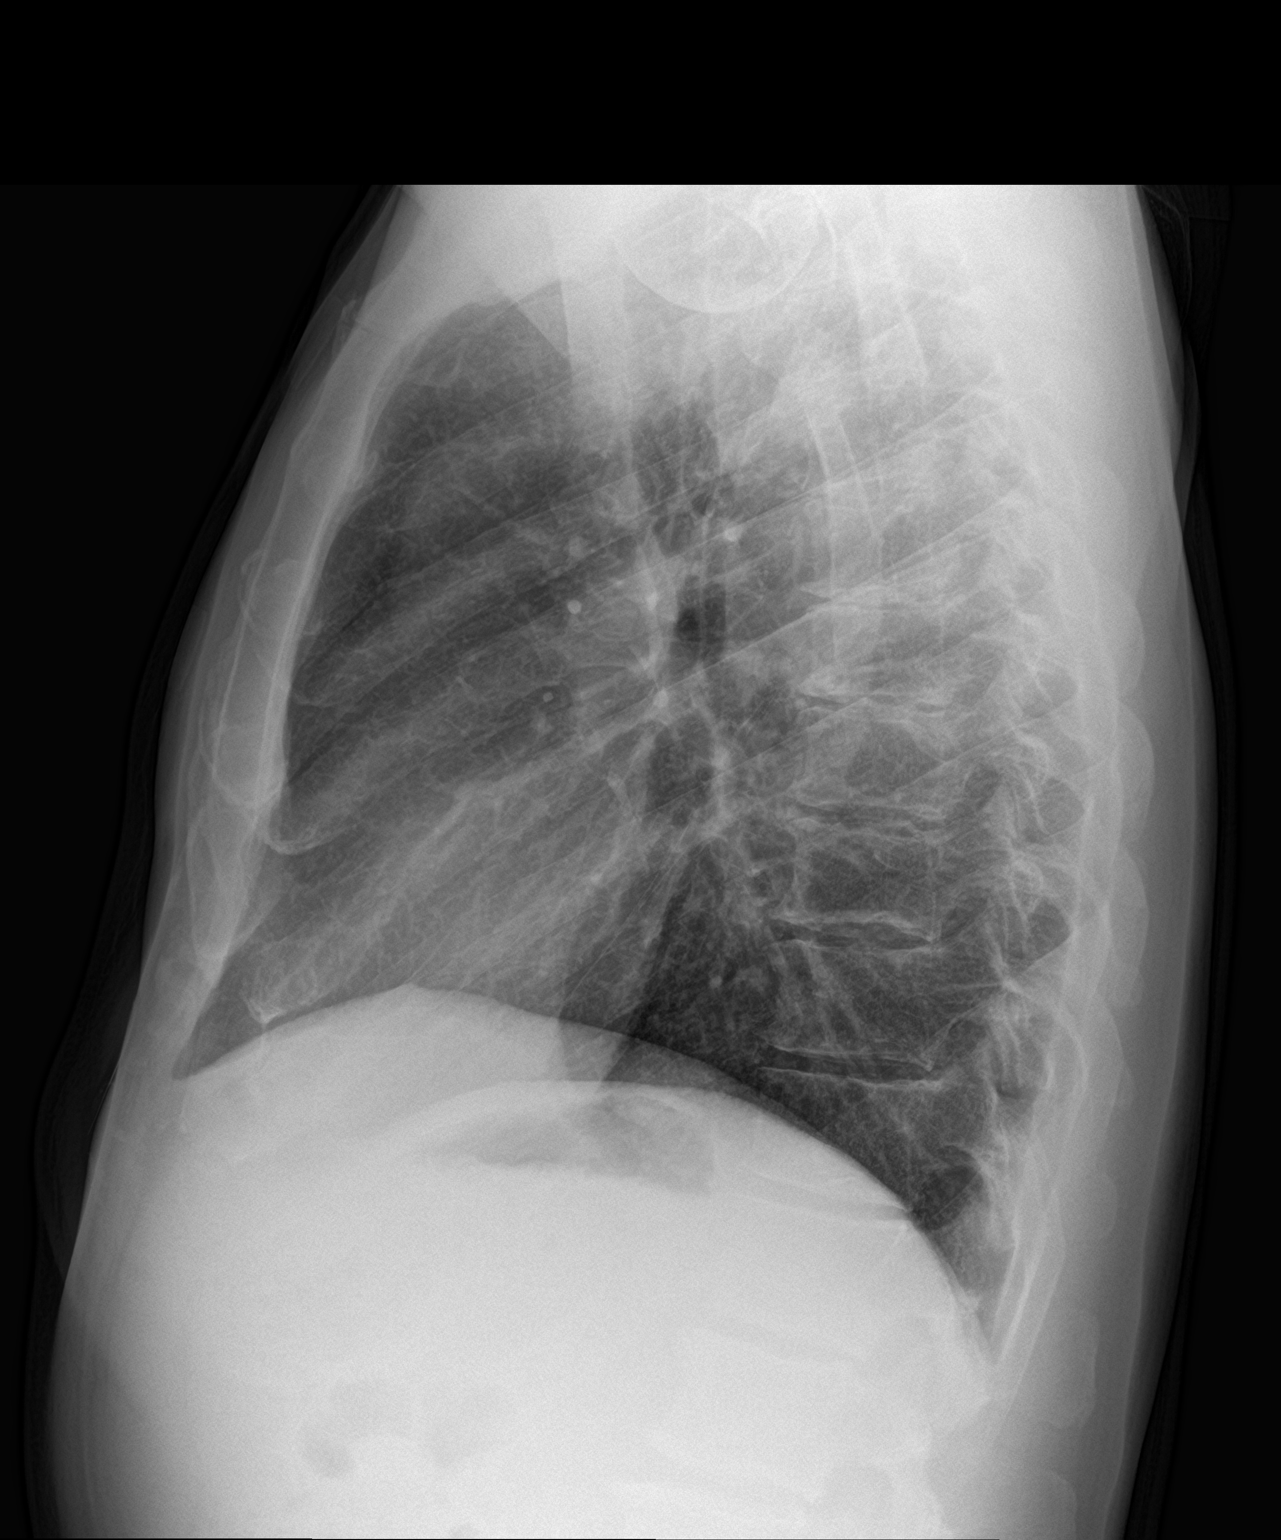

[2 of 2 positions shown; findings below may reference images not displayed]

FINDINGS: The heart size and mediastinal contours are within normal limits.
Both lungs are clear. Degenerative changes of the spine.
IMPRESSION: No active cardiopulmonary disease.

## 2019-09-16 IMAGING — US US ABDOMEN COMPLETE WITH ELASTOGRAPHY
1 series · 13 of 25 positions shown · non-contrast
Comparison: None.

CLINICAL DATA: Hepatitis C.



[Series 1: us abdomen complete with elastography · 13 of 127 slices shown]
[im 1/127]
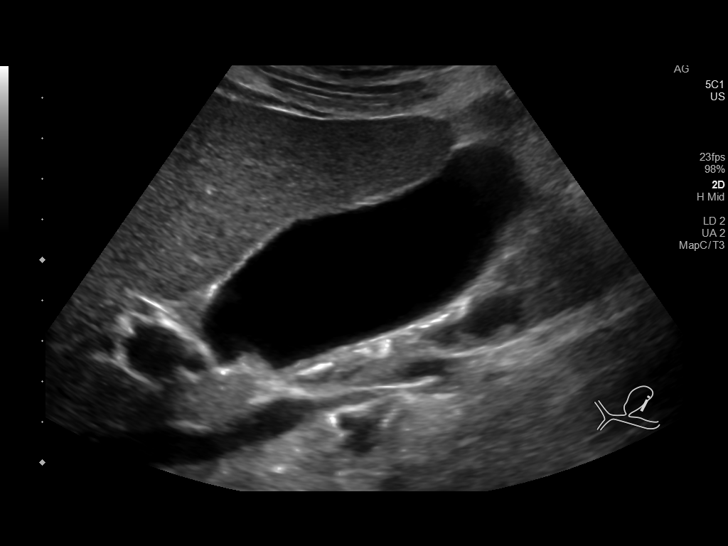
[im 11/127]
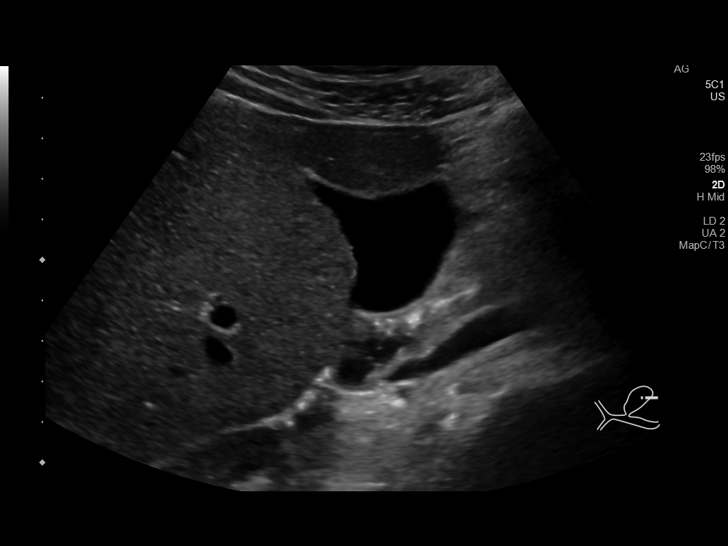
[im 22/127]
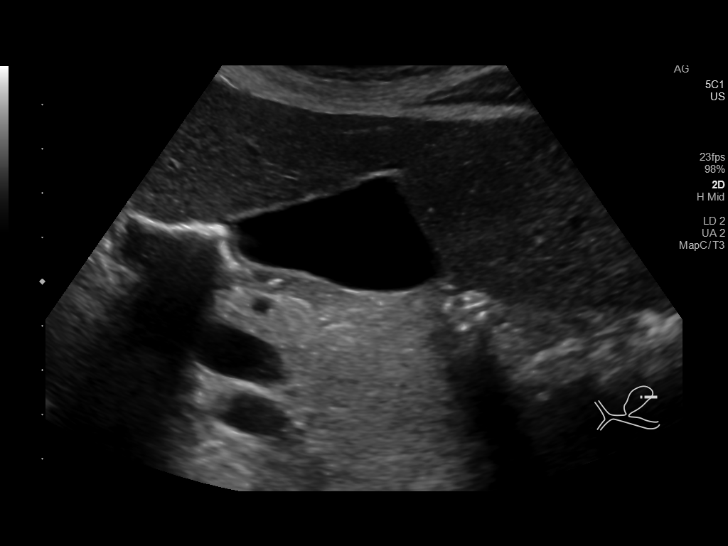
[im 32/127]
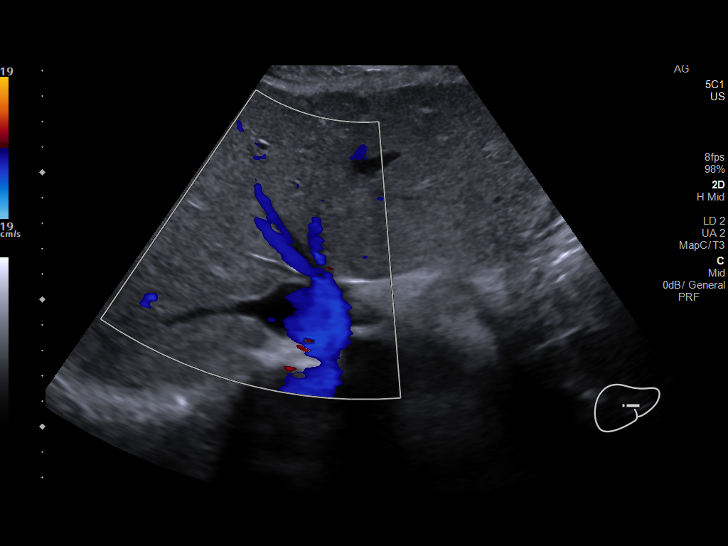
[im 43/127]
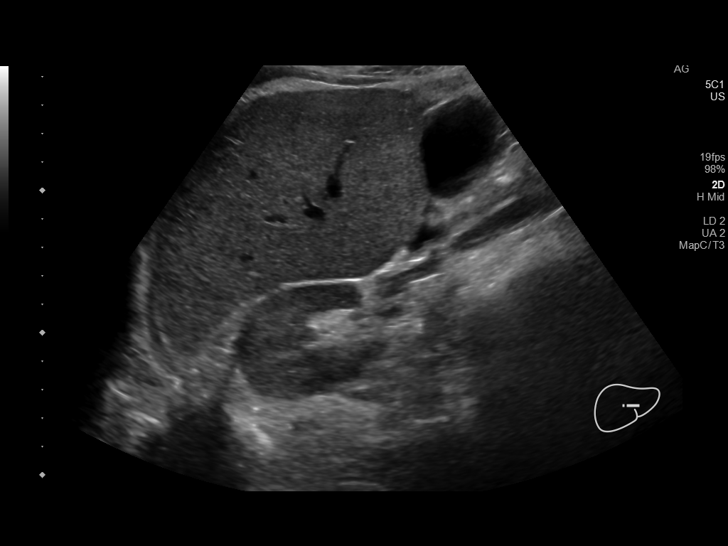
[im 53/127]
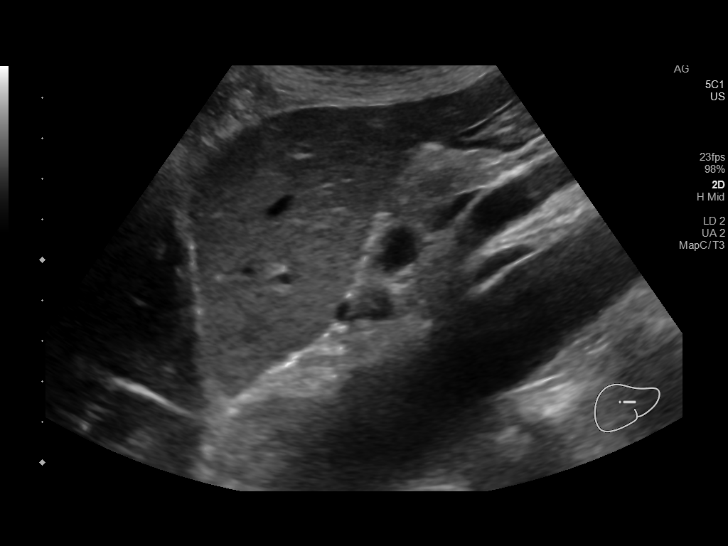
[im 64/127]
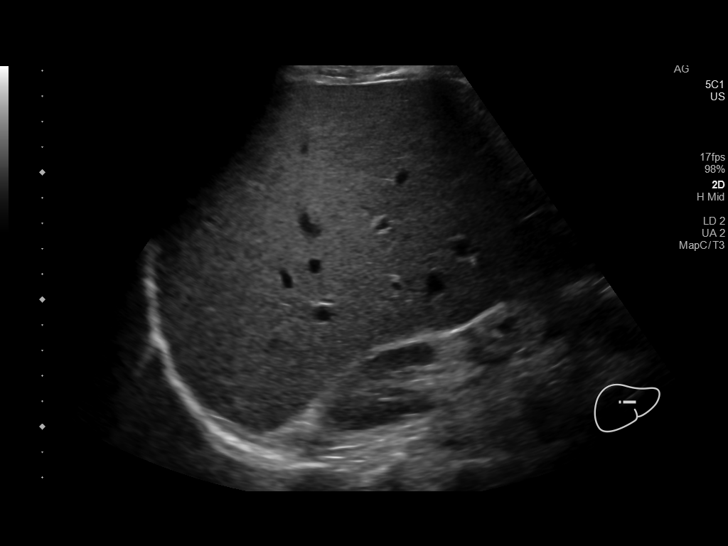
[im 74/127]
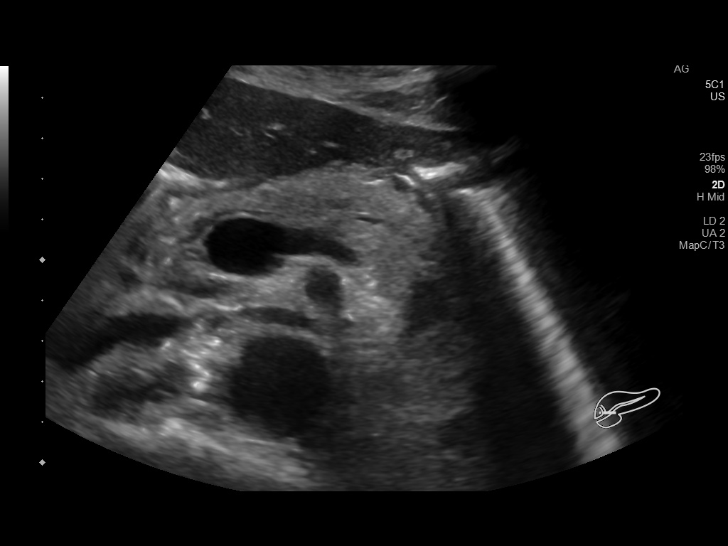
[im 85/127]
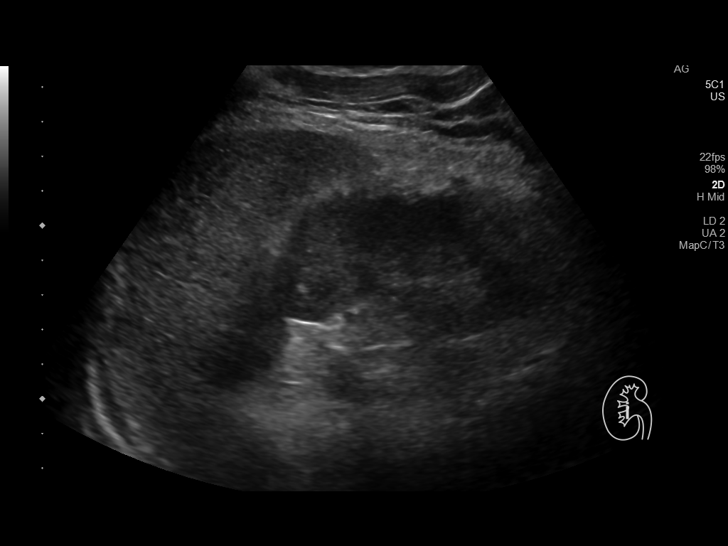
[im 95/127]
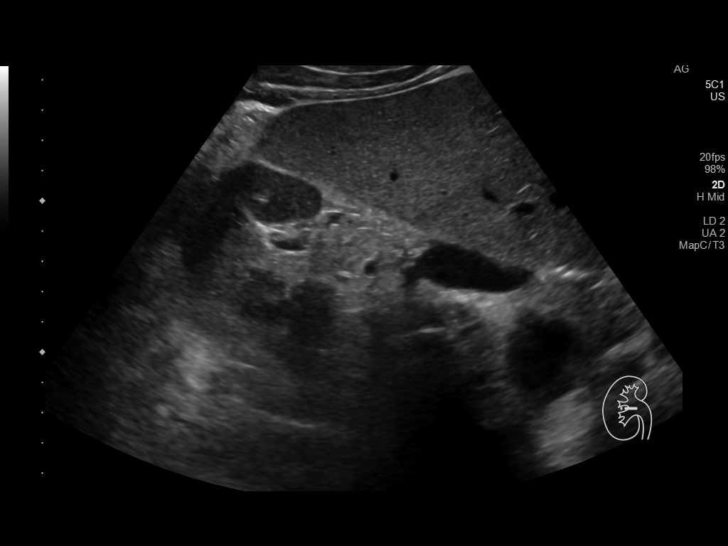
[im 106/127]
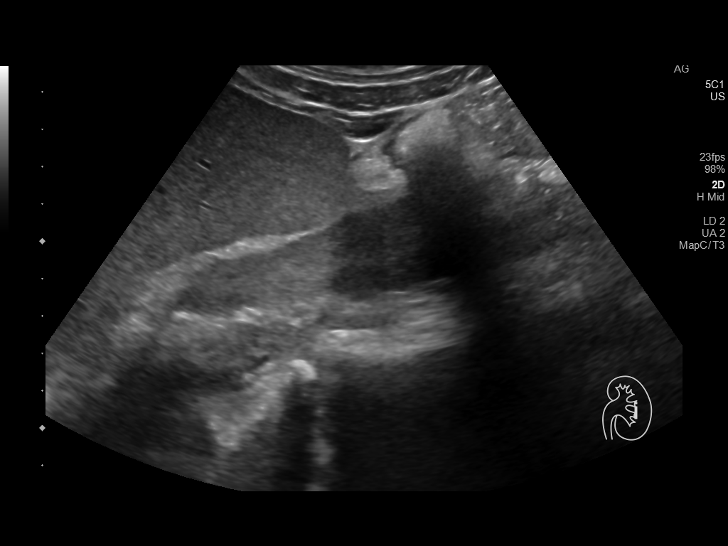
[im 116/127]
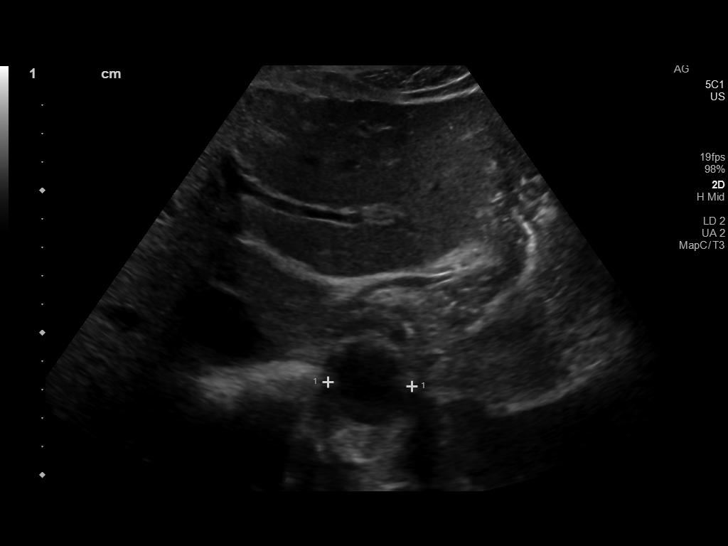
[im 127/127]
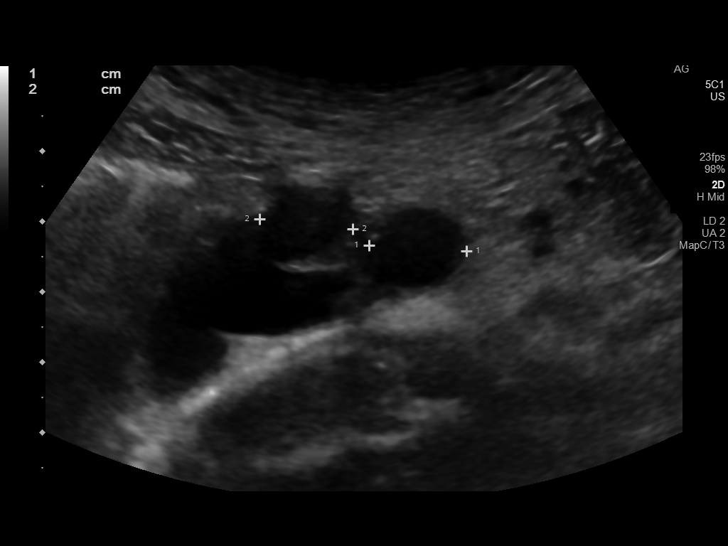

[13 of 25 positions shown; findings below may reference images not displayed]

FINDINGS: ULTRASOUND ABDOMEN

Gallbladder: No gallstones or wall thickening visualized. No
sonographic Murphy sign noted by sonographer.

Common bile duct: Diameter: 2.3 mm

Liver: Heterogeneous and slightly coarse echogenicity with irregular
liver contour suggesting cirrhosis. No focal hepatic lesions. Portal
vein is patent on color Doppler imaging with normal direction of
blood flow towards the liver.

IVC: No abnormality visualized.

Pancreas: Sonographically unremarkable.

Spleen: Upper limits of normal in size. Splenic volume is 436 cubic
cm.

Right Kidney: Length: 13.6 cm. Normal renal cortical thickness and
echogenicity without focal lesions or hydronephrosis.

Left Kidney: Length: 12.6 cm. Normal renal cortical thickness and
echogenicity without focal lesions or hydronephrosis.

Abdominal aorta: 3.0 cm

Other findings: None.

ULTRASOUND HEPATIC ELASTOGRAPHY

Device: Siemens Helix VTQ

Patient position: Left Lateral Decubitus

Transducer [id number]

Number of measurements: 10

Hepatic segment:  8

Median velocity:   1.78 m/sec

IQR:

IQR/Median velocity ratio:

Corresponding Metavir fibrosis score:  F2 + some F3

Risk of fibrosis: Moderate

Limitations of exam: None

Please note that abnormal shear wave velocities may also be
identified in clinical settings other than with hepatic fibrosis,
such as: acute hepatitis, elevated right heart and central venous
pressures including use of beta blockers, Neiser disease
([place]), infiltrative processes such as
mastocytosis/amyloidosis/infiltrative tumor, extrahepatic
cholestasis, in the post-prandial state, and liver transplantation.
Correlation with patient history, laboratory data, and clinical
condition recommended.
IMPRESSION: ULTRASOUND ABDOMEN:

Heterogeneous appearing hepatic echogenicity and somewhat irregular
cortex suggesting cirrhosis. No focal hepatic lesions.

Normal directional flow in the portal vein.

ULTRASOUND HEPATIC ELASTOGRAPHY:

Median hepatic shear wave velocity is calculated at 1.78 m/sec.

Corresponding Metavir fibrosis score is F2 + some F3.

Risk of fibrosis is Moderate.

Follow-up: Additional testing appropriate

## 2019-09-17 ENCOUNTER — Encounter (INDEPENDENT_AMBULATORY_CARE_PROVIDER_SITE_OTHER): Payer: Managed Care, Other (non HMO) | Admitting: Nurse Practitioner

## 2019-12-11 ENCOUNTER — Other Ambulatory Visit (INDEPENDENT_AMBULATORY_CARE_PROVIDER_SITE_OTHER): Payer: Self-pay | Admitting: *Deleted

## 2019-12-11 DIAGNOSIS — B1920 Unspecified viral hepatitis C without hepatic coma: Secondary | ICD-10-CM

## 2020-03-29 DIAGNOSIS — B349 Viral infection, unspecified: Secondary | ICD-10-CM | POA: Diagnosis not present

## 2020-03-29 DIAGNOSIS — B182 Chronic viral hepatitis C: Secondary | ICD-10-CM | POA: Diagnosis not present

## 2020-03-29 DIAGNOSIS — B1710 Acute hepatitis C without hepatic coma: Secondary | ICD-10-CM | POA: Diagnosis not present

## 2020-03-29 DIAGNOSIS — F5221 Male erectile disorder: Secondary | ICD-10-CM | POA: Diagnosis not present

## 2020-03-29 DIAGNOSIS — D519 Vitamin B12 deficiency anemia, unspecified: Secondary | ICD-10-CM | POA: Diagnosis not present

## 2020-04-05 DIAGNOSIS — L02415 Cutaneous abscess of right lower limb: Secondary | ICD-10-CM | POA: Diagnosis not present

## 2020-04-05 DIAGNOSIS — L02416 Cutaneous abscess of left lower limb: Secondary | ICD-10-CM | POA: Diagnosis not present

## 2020-04-05 DIAGNOSIS — M791 Myalgia, unspecified site: Secondary | ICD-10-CM | POA: Diagnosis not present

## 2020-04-05 DIAGNOSIS — F17200 Nicotine dependence, unspecified, uncomplicated: Secondary | ICD-10-CM | POA: Diagnosis not present

## 2020-04-26 DIAGNOSIS — B1710 Acute hepatitis C without hepatic coma: Secondary | ICD-10-CM | POA: Diagnosis not present

## 2020-04-26 DIAGNOSIS — B182 Chronic viral hepatitis C: Secondary | ICD-10-CM | POA: Diagnosis not present

## 2020-04-26 DIAGNOSIS — D519 Vitamin B12 deficiency anemia, unspecified: Secondary | ICD-10-CM | POA: Diagnosis not present

## 2020-04-26 DIAGNOSIS — B349 Viral infection, unspecified: Secondary | ICD-10-CM | POA: Diagnosis not present

## 2020-05-03 DIAGNOSIS — D696 Thrombocytopenia, unspecified: Secondary | ICD-10-CM | POA: Diagnosis not present

## 2020-05-03 DIAGNOSIS — B182 Chronic viral hepatitis C: Secondary | ICD-10-CM | POA: Diagnosis not present

## 2020-05-03 DIAGNOSIS — D72819 Decreased white blood cell count, unspecified: Secondary | ICD-10-CM | POA: Diagnosis not present

## 2020-05-03 DIAGNOSIS — D519 Vitamin B12 deficiency anemia, unspecified: Secondary | ICD-10-CM | POA: Diagnosis not present

## 2020-05-03 DIAGNOSIS — B349 Viral infection, unspecified: Secondary | ICD-10-CM | POA: Diagnosis not present

## 2020-05-03 DIAGNOSIS — D649 Anemia, unspecified: Secondary | ICD-10-CM | POA: Diagnosis not present

## 2020-05-03 DIAGNOSIS — B1710 Acute hepatitis C without hepatic coma: Secondary | ICD-10-CM | POA: Diagnosis not present

## 2020-06-20 DIAGNOSIS — R21 Rash and other nonspecific skin eruption: Secondary | ICD-10-CM | POA: Diagnosis not present

## 2020-06-22 DIAGNOSIS — I10 Essential (primary) hypertension: Secondary | ICD-10-CM | POA: Diagnosis not present

## 2020-06-22 DIAGNOSIS — L03818 Cellulitis of other sites: Secondary | ICD-10-CM | POA: Diagnosis not present

## 2020-06-22 DIAGNOSIS — K760 Fatty (change of) liver, not elsewhere classified: Secondary | ICD-10-CM | POA: Diagnosis not present

## 2020-06-22 DIAGNOSIS — G4489 Other headache syndrome: Secondary | ICD-10-CM | POA: Diagnosis not present

## 2020-08-09 ENCOUNTER — Ambulatory Visit: Payer: Self-pay | Admitting: Orthopedic Surgery

## 2020-08-10 ENCOUNTER — Other Ambulatory Visit: Payer: Self-pay

## 2020-08-15 ENCOUNTER — Other Ambulatory Visit: Payer: Self-pay

## 2020-08-19 ENCOUNTER — Other Ambulatory Visit (HOSPITAL_COMMUNITY): Payer: Self-pay | Admitting: *Deleted

## 2020-08-19 DIAGNOSIS — D500 Iron deficiency anemia secondary to blood loss (chronic): Secondary | ICD-10-CM

## 2020-08-19 NOTE — Progress Notes (Signed)
Lab orders placed per MD

## 2020-08-25 ENCOUNTER — Inpatient Hospital Stay (HOSPITAL_COMMUNITY): Payer: 59 | Attending: Hematology

## 2020-08-25 ENCOUNTER — Other Ambulatory Visit: Payer: Self-pay

## 2020-08-25 DIAGNOSIS — E538 Deficiency of other specified B group vitamins: Secondary | ICD-10-CM | POA: Diagnosis not present

## 2020-08-25 DIAGNOSIS — D61818 Other pancytopenia: Secondary | ICD-10-CM | POA: Insufficient documentation

## 2020-08-25 DIAGNOSIS — D500 Iron deficiency anemia secondary to blood loss (chronic): Secondary | ICD-10-CM

## 2020-08-25 LAB — COMPREHENSIVE METABOLIC PANEL
ALT: 10 U/L (ref 0–44)
AST: 15 U/L (ref 15–41)
Albumin: 3.9 g/dL (ref 3.5–5.0)
Alkaline Phosphatase: 40 U/L (ref 38–126)
Anion gap: 8 (ref 5–15)
BUN: 17 mg/dL (ref 6–20)
CO2: 27 mmol/L (ref 22–32)
Calcium: 8.7 mg/dL — ABNORMAL LOW (ref 8.9–10.3)
Chloride: 105 mmol/L (ref 98–111)
Creatinine, Ser: 1.21 mg/dL (ref 0.61–1.24)
GFR, Estimated: >60 mL/min (ref >60)
Glucose, Bld: 97 mg/dL (ref 70–99)
Potassium: 3.8 mmol/L (ref 3.5–5.1)
Sodium: 140 mmol/L (ref 135–145)
Total Bilirubin: 0.6 mg/dL (ref 0.3–1.2)
Total Protein: 7 g/dL (ref 6.5–8.1)

## 2020-08-25 LAB — RETICULOCYTES
Immature Retic Fract: 21.5 % — ABNORMAL HIGH (ref 2.3–15.9)
RBC.: 3.11 MIL/uL — ABNORMAL LOW (ref 4.22–5.81)
Retic Count, Absolute: 42 10*3/uL (ref 19.0–186.0)
Retic Ct Pct: 1.4 % (ref 0.4–3.1)

## 2020-08-25 LAB — CBC WITH DIFFERENTIAL/PLATELET
Abs Immature Granulocytes: 0.01 10*3/uL (ref 0.00–0.07)
Basophils Absolute: 0 10*3/uL (ref 0.0–0.1)
Basophils Relative: 0 %
Eosinophils Absolute: 0.1 10*3/uL (ref 0.0–0.5)
Eosinophils Relative: 3 %
HCT: 32.7 % — ABNORMAL LOW (ref 39.0–52.0)
Hemoglobin: 10.9 g/dL — ABNORMAL LOW (ref 13.0–17.0)
Immature Granulocytes: 0 %
Lymphocytes Relative: 66 %
Lymphs Abs: 2 10*3/uL (ref 0.7–4.0)
MCH: 34.6 pg — ABNORMAL HIGH (ref 26.0–34.0)
MCHC: 33.3 g/dL (ref 30.0–36.0)
MCV: 103.8 fL — ABNORMAL HIGH (ref 80.0–100.0)
Monocytes Absolute: 0 10*3/uL — ABNORMAL LOW (ref 0.1–1.0)
Monocytes Relative: 1 %
Neutro Abs: 0.9 10*3/uL — ABNORMAL LOW (ref 1.7–7.7)
Neutrophils Relative %: 30 %
Platelets: 87 10*3/uL — ABNORMAL LOW (ref 150–400)
RBC: 3.15 MIL/uL — ABNORMAL LOW (ref 4.22–5.81)
RDW: 15 % (ref 11.5–15.5)
WBC: 3.1 10*3/uL — ABNORMAL LOW (ref 4.0–10.5)
nRBC: 0 % (ref 0.0–0.2)

## 2020-08-25 LAB — LACTATE DEHYDROGENASE: LDH: 106 U/L (ref 98–192)

## 2020-08-25 LAB — VITAMIN B12: Vitamin B-12: 176 pg/mL — ABNORMAL LOW (ref 180–914)

## 2020-08-25 LAB — FOLATE: Folate: 14.3 ng/mL (ref >5.9)

## 2020-08-26 ENCOUNTER — Ambulatory Visit: Payer: Self-pay | Admitting: Orthopedic Surgery

## 2020-08-26 NOTE — H&P (Signed)
Subjective:   PMH: smoker, elevated liver tests CC: severe debilitating back pain and moderate left radicular leg pain Ongoing pain for 2 years.  DAY 1: ALIF L4-5, XLIF L3-4 DAY2: PSF L3-5 CONE DATE: 10/27, 10/28  Tried to see PCP for clearance, but states that PCP will not see him as this is WC. Has appointment scheduled with oncology to r/o multiple myeloma. Has appointment scheduled with vascular approach surgeon.  Patient Active Problem List   Diagnosis Date Noted  . Hepatitis C 01/27/2019  . Other pancytopenia ([place]) 05/07/2018  . Macrocytosis 05/07/2018   Past Medical History:  Diagnosis Date  . Degeneration of lumbar intervertebral disc   . Hepatitis C   . Hepatitis C 01/27/2019  . Hypertension   . Lumbar radiculopathy   . Spinal stenosis of lumbar region   . Spondylolysis    CHRONIC BILATERAL.  GRADE L5-S1.  . Tobacco abuse     No past surgical history on file.  Current Outpatient Medications  Medication Sig Dispense Refill Last Dose  . albuterol (VENTOLIN HFA) 108 (90 Base) MCG/ACT inhaler albuterol sulfate HFA 90 mcg/actuation aerosol inhaler  INHALE 1 TO 2 PUFFS INTO LUNGS EVERY 4 TO 6 HOURS AS NEEDED FOR SHORTNESS OF BREATH     . aspirin-acetaminophen-caffeine (EXCEDRIN MIGRAINE) 250-250-65 MG tablet Take 2 tablets by mouth every 6 (six) hours as needed for headache.     Marland Kitchen buPROPion (WELLBUTRIN SR) 150 MG 12 hr tablet Take 150 mg by mouth 2 (two) times daily.   1   . diclofenac sodium (VOLTAREN) 1 % GEL Apply 2 g topically daily as needed (for pain).   5   . gabapentin (NEURONTIN) 300 MG capsule gabapentin 300 mg capsule  Take 1 capsule 3 times a day by oral route as needed for 30 days.     . Lido-Capsaicin-Men-Methyl Sal (LIDOPRO) 4-.324-09-07.5 % OINT LidoPro 4 %-27.5 %-0.0325 % topical ointment  apply to affected area tid prn pain     . lisinopril (PRINIVIL,ZESTRIL) 10 MG tablet Take 1 tablet (10 mg total) by mouth daily. 30 tablet 1   . losartan (COZAAR)  50 MG tablet losartan 50 mg tablet  TAKE 1 TABLET BY MOUTH ONCE DAILY     . meloxicam (MOBIC) 15 MG tablet Take 15 mg by mouth daily.     . rizatriptan (MAXALT) 5 MG tablet rizatriptan 5 mg tablet     . Sofosbuvir-Velpatasvir 400-100 MG TABS sofosbuvir 400 mg-velpatasvir 100 mg tablet     . tiZANidine (ZANAFLEX) 4 MG tablet Take 4 mg by mouth every 6 (six) hours as needed for muscle spasms.   1   . traMADol (ULTRAM) 50 MG tablet tramadol 50 mg tablet  TAKE 1 TABLET BY MOUTH EVERY 8 HOURS AS NEEDED FOR SEVERE PAIN     . traZODone (DESYREL) 50 MG tablet Take 50 mg by mouth at bedtime as needed for sleep.   1    No current facility-administered medications for this visit.   Facility-Administered Medications Ordered in Other Visits  Medication Dose Route Frequency Provider Last Rate Last Admin  . cyanocobalamin ((VITAMIN B-12)) injection 1,000 mcg  1,000 mcg Intramuscular Once Derek Jack, MD       Allergies  Allergen Reactions  . Acetaminophen   . Aspirin   . Ibuprofen Hives and Itching    Social History   Tobacco Use  . Smoking status: Current Every Day Smoker    Packs/day: 1.00    Years: 40.00  Pack years: 40.00  . Smokeless tobacco: Never Used  Substance Use Topics  . Alcohol use: Not Currently    Family History  Problem Relation Age of Onset  . Heart attack Mother   . Lung cancer Father     Review of Systems As stated in HPI  Objective:   Vitals: Ht: 6 ft 2 in 08/26/2020 09:54 am Wt: 186 lbs 08/26/2020 10:01 am BMI: 23.9 08/26/2020 10:01 am BP: 173/113 L arm 08/26/2020 10:03 am  Clinical exam: Saahas is a pleasant individual, who appears younger than their stated age. He is alert and orientated 3. No shortness of breath, chest pain.  Heart: RRR, no rubs, murmers, or gallops  Lungs: CTAB  Abdomen is soft and non-tender, negative loss of bowel and bladder control, no rebound tenderness.  Negative: skin lesions abrasions contusions Peripheral pulses:  2+ dorsalis pedis/posterior tibialis pulses bilaterally. Compartment soft and nontender. Patient does have lower extremity ulcerations.  Gait pattern: Abnormal gait pattern due to severe right radicular leg pain.  Assistive devices: None  Neuro: negative straight leg raise test. 5/5 motor strength in the lower extremity. Positive numbness and dysesthesias bilaterally in the lower extremity right side is worse than the left. Negative Babinski test, no clonus,  Musculoskeletal: Significant low back pain with palpation and attempted range of motion. Unable to forward flex extend or rotate due to severe back buttock and bilateral leg pain right worse than the left. No apparent hip, knee, ankle pain with isolated joint range of motion.  X-rays of the lumbar spine demonstrated degenerative grade 1 borderline 2 spondylolisthesis at L5-S1. Advanced degenerative disc disease L4-5 moderate to severe degenerative disease L3-4. Loss of normal foraminal volume at all 3 levels with facet arthropathy. No fracture or scoliosis is noted  Lumbar MRI: completed on 05/05/20 was reviewed with the patient. It was completed at [facility]; I have independently reviewed the images as well as the radiology report. Patchy fibrofatty variant marrow is noted. This may be a stress or insufficiency microfracturing left and right sacral ala but it is unknown. Question possible multiple myeloma. Chronic bilateral L5 pars defects with a grade 1 spondylolisthesis. Severe foraminal narrowing with impingement on the exiting V nerve root and dorsal root ganglion. Severe degenerative disease L4-5 and L3-4 with severe stenosis centrally and in the foramen. Moderate degenerative changes in the remainder of the lumbar spine.  Assessment:   Lyall returns today for follow-up with ongoing severe debilitating back pain and moderate left radicular leg pain. He states 80% of his loss in quality-of-life is due to his back pain. At this point had 2  years of progressive debilitating pain. His quality-of-life is significantly suffered. He would like to move forward with surgical intervention.  Based on imaging studies and clinical complaints I believe his primary pathology is L3-S1. To address this would require interbody fixation L3-S1 with posterior supplemental pedicle screw fixation and possible supplemental decompression. The goal of surgery is to reduce not eliminate his pain and thereby improve his quality-of-life. I have gone over this with him in great detail and he is expressed an understanding.  Plan surgical procedure: Day 1:Anterior lumbar interbody fusion L4-5 and L5-S1, lateral interbody fusion L3-4. Day 2: Posterior pedicle screw fixation possible decompression based upon whether or not the neuropathic leg pain improved.  Risks and benefits of surgery were discussed with the patient. These include: Infection, bleeding, death, stroke, paralysis, ongoing or worse pain, need for additional surgery, nonunion, leak of spinal fluid, adjacent segment  degeneration requiring additional fusion surgery, need for posterior decompression and/or fusion. Bleeding from major vessels, and blood clots (deep venous thrombosis)requiring additional treatment, loss in bowel and bladder control, injury to bladder, ureters, and other major abdominal organs.  Additional risk for male patients: Retrograde ejaculation, and therefore infertility.   OLIF/XLIF risks, benefits of surgery were reviewed with the patient. These include: infection, bleeding, death, stroke, paralysis, ongoing or worse pain, need for additional surgery, injury to the lumbar plexus resulting in hip flexor weakness and difficulty walking without assistive devices. Adjacent segment degenerative disease, need for additional surgery including fusing other levels, leak of spinal fluid, Nonunion, hardware failure, breakage, or mal-position. Deep venous thrombosis (DVT) requiring additional  treatment such as filter, and/or medications. Injury to abdominal contents, loss in bowel and bladder control.  Risks and benefits of spinal fusion: Infection, bleeding, death, stroke, paralysis, ongoing or worse pain, need for additional surgery, nonunion, leak of spinal fluid, adjacent segment degeneration requiring additional fusion surgery, Injury to abdominal vessels that can require anterior surgery to stop bleeding. Malposition of the cage and/or pedicle screws that could require additional surgery. Loss of bowel and bladder control. Postoperative hematoma causing neurologic compression that could require urgent or emergent re-operation.  Plan:   Treatment plan: We still need to obtain preoperative medical clearance from his primary care physician & he still needs to be evaluated by heme/onc (scheduled) and vascular (scheduled). I have sent a message to our surgical scheduler and Workers comp department to inquire about the PCP clearance.  Discussion: I reviewed the patient's medication list with him. He is on the blood that is. Using any aspirin products. Not using any anti-inflammatory medications. I have instructed him not to use any anti-inflammatory medications leading up to surgery he did express understanding of this.  We have also discussed the post-operative recovery period to include: bathing/showering restrictions, wound healing, activity (and driving) restrictions, medications/pain mangement.  We have also discussed post-operative redflags to include: signs and symptoms of postoperative infection, DVT/PE.  Patient Has appointment scheduled to be fitted for LSO brace with physical therapy.  All patients questions were invited and answered

## 2020-08-29 ENCOUNTER — Other Ambulatory Visit: Payer: Self-pay

## 2020-08-29 ENCOUNTER — Encounter: Payer: Self-pay | Admitting: Vascular Surgery

## 2020-08-29 ENCOUNTER — Ambulatory Visit (INDEPENDENT_AMBULATORY_CARE_PROVIDER_SITE_OTHER): Payer: 59 | Admitting: Vascular Surgery

## 2020-08-29 VITALS — BP 153/103 | HR 62 | Temp 99.0°F | Resp 18 | Ht 74.0 in | Wt 192.0 lb

## 2020-08-29 DIAGNOSIS — M5137 Other intervertebral disc degeneration, lumbosacral region: Secondary | ICD-10-CM | POA: Diagnosis not present

## 2020-08-29 NOTE — Progress Notes (Signed)
[facility] [place]  Patient name: Logan Brady MRN: [id number] DOB: 18-Jun-1961 Sex: male  REASON FOR CONSULT: Discussion for anterior exposure for L4-5 disc fusion with Dr. Rolena Infante  HPI: Logan Brady is a 59 y.o. male, here today for discussion of anterior exposure.  He has multilevel disc disease.  He reports that he had an injury in June 2020.  He has had progressive back pain associated with this and is now planning for surgery.  He does have history of hypertension.  History of hepatitis C.  Past Medical History:  Diagnosis Date  . Degeneration of lumbar intervertebral disc   . Hepatitis C   . Hepatitis C 01/27/2019  . Hypertension   . Lumbar radiculopathy   . Spinal stenosis of lumbar region   . Spondylolysis    CHRONIC BILATERAL.  GRADE L5-S1.  . Tobacco abuse     Family History  Problem Relation Age of Onset  . Heart attack Mother   . Lung cancer Father     SOCIAL HISTORY: Social History   Socioeconomic History  . Marital status: Divorced    Spouse name: Not on file  . Number of children: 4  . Years of education: 73  . Highest education level: High school graduate  Occupational History  . Occupation: maintenance  Tobacco Use  . Smoking status: Current Every Day Smoker    Packs/day: 1.00    Years: 40.00    Pack years: 40.00  . Smokeless tobacco: Never Used  Vaping Use  . Vaping Use: Never used  Substance and Sexual Activity  . Alcohol use: Not Currently  . Drug use: Not Currently    Comment: quit 30 years ago  . Sexual activity: Yes  Other Topics Concern  . Not on file  Social History Narrative  . Not on file   Social Determinants of Health   Financial Resource Strain:   . Difficulty of Paying Living Expenses: Not on file  Food Insecurity:   . Worried About Charity fundraiser in the Last Year: Not on file  . Ran Out of Food in the Last Year: Not on file  Transportation Needs:   . Lack of Transportation (Medical): Not  on file  . Lack of Transportation (Non-Medical): Not on file  Physical Activity:   . Days of Exercise per Week: Not on file  . Minutes of Exercise per Session: Not on file  Stress:   . Feeling of Stress : Not on file  Social Connections:   . Frequency of Communication with Friends and Family: Not on file  . Frequency of Social Gatherings with Friends and Family: Not on file  . Attends Religious Services: Not on file  . Active Member of Clubs or Organizations: Not on file  . Attends Archivist Meetings: Not on file  . Marital Status: Not on file  Intimate Partner Violence:   . Fear of Current or Ex-Partner: Not on file  . Emotionally Abused: Not on file  . Physically Abused: Not on file  . Sexually Abused: Not on file    Allergies  Allergen Reactions  . Acetaminophen   . Aspirin   . Ibuprofen Hives and Itching    Current Outpatient Medications  Medication Sig Dispense Refill  . albuterol (VENTOLIN HFA) 108 (90 Base) MCG/ACT inhaler albuterol sulfate HFA 90 mcg/actuation aerosol inhaler  INHALE 1 TO 2 PUFFS INTO LUNGS EVERY 4 TO 6 HOURS AS NEEDED FOR SHORTNESS OF BREATH    .  buPROPion (WELLBUTRIN SR) 150 MG 12 hr tablet Take 150 mg by mouth 2 (two) times daily.   1  . gabapentin (NEURONTIN) 300 MG capsule gabapentin 300 mg capsule  Take 1 capsule 3 times a day by oral route as needed for 30 days.    . Lido-Capsaicin-Men-Methyl Sal (LIDOPRO) 4-.324-09-07.5 % OINT LidoPro 4 %-27.5 %-0.0325 % topical ointment  apply to affected area tid prn pain    . losartan (COZAAR) 50 MG tablet losartan 50 mg tablet  TAKE 1 TABLET BY MOUTH ONCE DAILY    . rizatriptan (MAXALT) 5 MG tablet rizatriptan 5 mg tablet    . tiZANidine (ZANAFLEX) 4 MG tablet Take 4 mg by mouth every 6 (six) hours as needed for muscle spasms.   1  . traMADol (ULTRAM) 50 MG tablet tramadol 50 mg tablet  TAKE 1 TABLET BY MOUTH EVERY 8 HOURS AS NEEDED FOR SEVERE PAIN    . traZODone (DESYREL) 50 MG tablet Take  50 mg by mouth at bedtime as needed for sleep.   1  . aspirin-acetaminophen-caffeine (EXCEDRIN MIGRAINE) 250-250-65 MG tablet Take 2 tablets by mouth every 6 (six) hours as needed for headache. (Patient not taking: Reported on 08/29/2020)    . diclofenac sodium (VOLTAREN) 1 % GEL Apply 2 g topically daily as needed (for pain).  (Patient not taking: Reported on 08/29/2020)  5  . lisinopril (PRINIVIL,ZESTRIL) 10 MG tablet Take 1 tablet (10 mg total) by mouth daily. (Patient not taking: Reported on 08/29/2020) 30 tablet 1  . meloxicam (MOBIC) 15 MG tablet Take 15 mg by mouth daily. (Patient not taking: Reported on 08/29/2020)    . sildenafil (VIAGRA) 100 MG tablet Take by mouth at bedtime.    . Sofosbuvir-Velpatasvir 400-100 MG TABS sofosbuvir 400 mg-velpatasvir 100 mg tablet (Patient not taking: Reported on 08/29/2020)     No current facility-administered medications for this visit.   Facility-Administered Medications Ordered in Other Visits  Medication Dose Route Frequency Provider Last Rate Last Admin  . cyanocobalamin ((VITAMIN B-12)) injection 1,000 mcg  1,000 mcg Intramuscular Once Derek Jack, MD        REVIEW OF SYSTEMS:  [X]  denotes positive finding, [ ]  denotes negative finding Cardiac  Comments:  Chest pain or chest pressure:    Shortness of breath upon exertion:    Short of breath when lying flat:    Irregular heart rhythm:        Vascular    Pain in calf, thigh, or hip brought on by ambulation: x   Pain in feet at night that wakes you up from your sleep:     Blood clot in your veins:    Leg swelling:         Pulmonary    Oxygen at home:    Productive cough:     Wheezing:  x       Neurologic    Sudden weakness in arms or legs:     Sudden numbness in arms or legs:     Sudden onset of difficulty speaking or slurred speech:    Temporary loss of vision in one eye:     Problems with dizziness:         Gastrointestinal    Blood in stool:     Vomited blood:          Genitourinary    Burning when urinating:     Blood in urine:        Psychiatric    Major depression:  Hematologic    Bleeding problems:    Problems with blood clotting too easily:        Skin    Rashes or ulcers:        Constitutional    Fever or chills:      PHYSICAL EXAM: Vitals:   08/29/20 1313  BP: (!) 153/103  Pulse: 62  Resp: 18  Temp: 99 F (37.2 C)  TempSrc: Other (Comment)  SpO2: 96%  Weight: 192 lb (87.1 kg)  Height: 6\' 2"  (1.88 m)    GENERAL: The patient is a well-nourished male, in no acute distress. The vital signs are documented above. CARDIAC: There is a regular rate and rhythm.  VASCULAR: 2+ radial 2+ femoral and 2+ dorsalis pedis pulses bilaterally PULMONARY: There is good air exchange bilaterally without wheezing or rales. ABDOMEN: Soft and non-tender with normal pitched bowel sounds.  MUSCULOSKELETAL: There are no major deformities or cyanosis. NEUROLOGIC: No focal weakness or paresthesias are detected. SKIN: There are no ulcers or rashes noted. PSYCHIATRIC: The patient has a normal affect.  DATA:   CT scan of his abdomen from 08/13/2018 was reviewed.  This revealed mild iliac calcification bilaterally.  No severe aortic calcification  MEDICAL ISSUES:  Had a long discussion with the patient regarding my role for surgery.  Explained that Dr. Rolena Infante feels that he would be most appropriately treated with surgery and that anterior approach would be appropriate for part of this.  I discussed the incision over the L4-5 disc from the midline to the left.  Explained mobilization of the rectus muscle and mobilization of intraperitoneal contents to the right.  Also I discussed mobilization of the left ureter.  Discussed mobilization of the arterial venous structures overlying the spine with potential injury to all the above with most particular emphasized that potential injury to the veins.  I feel that he is an acceptable risk.  He has no history  of prior abdominal surgery.  He is not obese.  He has mild iliac calcification on CT scan.  We will plan on proceeding as scheduled   Curt Jews [facility] phone [phone]

## 2020-08-30 LAB — METHYLMALONIC ACID, SERUM: Methylmalonic Acid, Quantitative: 198 nmol/L (ref 0–378)

## 2020-09-01 ENCOUNTER — Inpatient Hospital Stay (HOSPITAL_COMMUNITY): Payer: 59 | Admitting: Oncology

## 2020-09-04 NOTE — Progress Notes (Signed)
[place], [place] - [address] [id number] [place] [address] Phone: [phone] Fax: [phone]      Your procedure is scheduled on Wednesday, October 27th.  Report to [facility] Main Entrance "A" at 5:30 A.M., and check in at the Admitting office.  Call this number if you have problems the morning of surgery:  [phone]  Call [phone] if you have any questions prior to your surgery date Monday-Friday 8am-4pm    Remember:  Do not eat or drink after midnight the night before your surgery   Take these medicines the morning of surgery with A SIP OF WATER   Albuterol inhaler - if needed (bring with you on day of surgery)  Gabapentin (Neurontin)  Tizanidine (Zanaflex) - if needed    As of today, STOP taking any Aspirin (unless otherwise instructed by your surgeon) Aleve, Naproxen, Ibuprofen, Motrin, Advil, Goody's, BC's, all herbal medications, fish oil, and all vitamins.                      Do not wear jewelry            Do not wear lotions, powders, colognes, or deodorant.            Men may shave face and neck.            Do not bring valuables to the hospital.            [facility] is not responsible for any belongings or valuables.  Do NOT Smoke (Tobacco/Vaping) or drink Alcohol 24 hours prior to your procedure If you use a CPAP at night, you may bring all equipment for your overnight stay.   Contacts, glasses, dentures or bridgework may not be worn into surgery.      For patients admitted to the hospital, discharge time will be determined by your treatment team.   Patients discharged the day of surgery will not be allowed to drive home, and someone needs to stay with them for 24 hours.    Special instructions:   [facility]- Preparing For Surgery  Before surgery, you can play an important role. Because skin is not sterile, your skin needs to be as free of germs as possible. You can reduce the number of germs on your  skin by washing with CHG (chlorahexidine gluconate) Soap before surgery.  CHG is an antiseptic cleaner which kills germs and bonds with the skin to continue killing germs even after washing.    Oral Hygiene is also important to reduce your risk of infection.  Remember - BRUSH YOUR TEETH THE MORNING OF SURGERY WITH YOUR REGULAR TOOTHPASTE  Please do not use if you have an allergy to CHG or antibacterial soaps. If your skin becomes reddened/irritated stop using the CHG.  Do not shave (including legs and underarms) for at least 48 hours prior to first CHG shower. It is OK to shave your face.  Please follow these instructions carefully.   1. Shower the NIGHT BEFORE SURGERY and the MORNING OF SURGERY with CHG Soap.   2. If you chose to wash your hair, wash your hair first as usual with your normal shampoo.  3. After you shampoo, rinse your hair and body thoroughly to remove the shampoo.  4. Use CHG as you would any other liquid soap. You can apply CHG directly to the skin and wash gently with a scrungie or a clean washcloth.   5. Apply  the CHG Soap to your body ONLY FROM THE NECK DOWN.  Do not use on open wounds or open sores. Avoid contact with your eyes, ears, mouth and genitals (private parts). Wash Face and genitals (private parts)  with your normal soap.   6. Wash thoroughly, paying special attention to the area where your surgery will be performed.  7. Thoroughly rinse your body with warm water from the neck down.  8. DO NOT shower/wash with your normal soap after using and rinsing off the CHG Soap.  9. Pat yourself dry with a CLEAN TOWEL.  10. Wear CLEAN PAJAMAS to bed the night before surgery  11. Place CLEAN SHEETS on your bed the night of your first shower and DO NOT SLEEP WITH PETS.   Day of Surgery: Wear Clean/Comfortable clothing the morning of surgery Do not apply any deodorants/lotions.   Remember to brush your teeth WITH YOUR REGULAR TOOTHPASTE.   Please read over the  following fact sheets that you were given.

## 2020-09-05 ENCOUNTER — Inpatient Hospital Stay (HOSPITAL_COMMUNITY)
Admission: RE | Admit: 2020-09-05 | Discharge: 2020-09-05 | Disposition: A | Discharge status: Home / Self Care | Payer: BC Managed Care – PPO | Source: Ambulatory Visit

## 2020-09-05 ENCOUNTER — Other Ambulatory Visit (HOSPITAL_COMMUNITY): Payer: BC Managed Care – PPO

## 2020-09-07 ENCOUNTER — Encounter (HOSPITAL_COMMUNITY): Admission: RE | Payer: Self-pay | Source: Home / Self Care

## 2020-09-07 ENCOUNTER — Inpatient Hospital Stay (HOSPITAL_COMMUNITY)
Admission: RE | Admit: 2020-09-07 | Payer: No Typology Code available for payment source | Source: Home / Self Care | Admitting: Orthopedic Surgery

## 2020-09-07 SURGERY — ANTERIOR LUMBAR FUSION 2 LEVELS
Anesthesia: General

## 2020-09-08 ENCOUNTER — Encounter (HOSPITAL_COMMUNITY): Admission: RE | Payer: Self-pay | Source: Home / Self Care

## 2020-09-08 ENCOUNTER — Inpatient Hospital Stay (HOSPITAL_COMMUNITY)
Admission: RE | Admit: 2020-09-08 | Payer: No Typology Code available for payment source | Source: Home / Self Care | Admitting: Orthopedic Surgery

## 2020-09-08 SURGERY — POSTERIOR LUMBAR FUSION 3 LEVEL
Anesthesia: General

## 2020-09-23 ENCOUNTER — Inpatient Hospital Stay (HOSPITAL_COMMUNITY): Payer: 59 | Attending: Oncology | Admitting: Oncology

## 2020-09-23 ENCOUNTER — Other Ambulatory Visit: Payer: Self-pay

## 2020-09-23 VITALS — BP 137/84 | HR 64 | Temp 97.4°F | Resp 18 | Wt 197.1 lb

## 2020-09-23 DIAGNOSIS — Z8249 Family history of ischemic heart disease and other diseases of the circulatory system: Secondary | ICD-10-CM | POA: Insufficient documentation

## 2020-09-23 DIAGNOSIS — D519 Vitamin B12 deficiency anemia, unspecified: Secondary | ICD-10-CM

## 2020-09-23 DIAGNOSIS — Z79899 Other long term (current) drug therapy: Secondary | ICD-10-CM | POA: Diagnosis not present

## 2020-09-23 DIAGNOSIS — D7589 Other specified diseases of blood and blood-forming organs: Secondary | ICD-10-CM | POA: Diagnosis not present

## 2020-09-23 DIAGNOSIS — E538 Deficiency of other specified B group vitamins: Secondary | ICD-10-CM | POA: Insufficient documentation

## 2020-09-23 DIAGNOSIS — M545 Low back pain, unspecified: Secondary | ICD-10-CM | POA: Diagnosis not present

## 2020-09-23 DIAGNOSIS — D61818 Other pancytopenia: Secondary | ICD-10-CM | POA: Diagnosis not present

## 2020-09-23 DIAGNOSIS — Z801 Family history of malignant neoplasm of trachea, bronchus and lung: Secondary | ICD-10-CM | POA: Diagnosis not present

## 2020-09-23 DIAGNOSIS — I10 Essential (primary) hypertension: Secondary | ICD-10-CM | POA: Insufficient documentation

## 2020-09-23 DIAGNOSIS — R7989 Other specified abnormal findings of blood chemistry: Secondary | ICD-10-CM | POA: Insufficient documentation

## 2020-09-23 DIAGNOSIS — F17210 Nicotine dependence, cigarettes, uncomplicated: Secondary | ICD-10-CM | POA: Insufficient documentation

## 2020-09-23 DIAGNOSIS — B1920 Unspecified viral hepatitis C without hepatic coma: Secondary | ICD-10-CM | POA: Diagnosis not present

## 2020-09-23 DIAGNOSIS — G8929 Other chronic pain: Secondary | ICD-10-CM | POA: Insufficient documentation

## 2020-09-23 MED ORDER — CYANOCOBALAMIN 1000 MCG/ML IJ SOLN
INTRAMUSCULAR | Status: AC
  Filled 2020-09-23: qty 1

## 2020-09-23 MED ORDER — CYANOCOBALAMIN 1000 MCG/ML IJ SOLN
1000.0000 ug | Freq: Once | INTRAMUSCULAR | Status: AC
  Administered 2020-09-23: 1000 ug via INTRAMUSCULAR

## 2020-09-23 NOTE — Progress Notes (Signed)
Per NP orders patient given B-12 injection to left deltoid.  Tolerated well. Patient discharged ambulatory and in stable condition.

## 2020-09-23 NOTE — Progress Notes (Signed)
[place], [place] [address]   CLINIC:  Medical Oncology/Hematology  PCP:  Celene Squibb, MD [place] [id number] [phone]   REASON FOR VISIT: Follow-up for pancytopenia and macrocytosis  CURRENT THERAPY: B12 injections weekly started 05/23/18   INTERVAL HISTORY:  Logan Brady 59 y.o. male returns for  follow-up for pancytopenia and macrocytosis.  He was last seen in our clinic on 08/15/2018 by Dr. Delton Coombes.  At that visit, he was started on B12 tablets for B12 deficiency and noted to have elevated LFTs with an elevated hep C antibody.  He was referred to hepatology for treatment.  Patient states he had his treatment over a year ago.  His liver function has started to normalize.  Today, he reports feeling fairly good.  He has chronic lower back pain and intermittent fatigue.  He specifically denies any abdominal concerns.  He is currently not working secondary to injury at work requiring a lumbar fusion.  This was scheduled for October and has now been pushed back secondary to needing clearance from his primary care provider and hematology.  His appetite is good and he drinks boost daily.  He denies any nausea vomiting or diarrhea.  Denies any easy bleeding or bruising.  REVIEW OF SYSTEMS:  Review of Systems  Constitutional: Positive for fatigue. Negative for appetite change, chills and fever.  HENT:  Negative.  Negative for hearing loss, lump/mass, mouth sores and nosebleeds.   Eyes: Negative.  Negative for eye problems.  Respiratory: Negative for cough, hemoptysis and shortness of breath.   Cardiovascular: Negative.  Negative for chest pain and leg swelling.  Gastrointestinal: Negative.  Negative for abdominal pain, blood in stool, constipation, diarrhea, nausea and vomiting.  Endocrine: Negative.  Negative for hot flashes.  Genitourinary: Negative.  Negative for bladder incontinence, difficulty urinating, dysuria,  frequency and hematuria.   Musculoskeletal: Positive for back pain. Negative for flank pain, gait problem and myalgias.  Skin: Negative.  Negative for itching and rash.  Neurological: Negative.  Negative for dizziness, gait problem, headaches, light-headedness and numbness.  Hematological: Negative.  Negative for adenopathy.  Psychiatric/Behavioral: Negative for confusion. The patient is not nervous/anxious.      PAST MEDICAL/SURGICAL HISTORY:  Past Medical History:  Diagnosis Date  . Degeneration of lumbar intervertebral disc   . Hepatitis C   . Hepatitis C 01/27/2019  . Hypertension   . Lumbar radiculopathy   . Spinal stenosis of lumbar region   . Spondylolysis    CHRONIC BILATERAL.  GRADE L5-S1.  . Tobacco abuse    No past surgical history on file.   SOCIAL HISTORY:  Social History   Socioeconomic History  . Marital status: Divorced    Spouse name: Not on file  . Number of children: 4  . Years of education: 88  . Highest education level: High school graduate  Occupational History  . Occupation: maintenance  Tobacco Use  . Smoking status: Current Every Day Smoker    Packs/day: 1.00    Years: 40.00    Pack years: 40.00  . Smokeless tobacco: Never Used  Vaping Use  . Vaping Use: Never used  Substance and Sexual Activity  . Alcohol use: Not Currently  . Drug use: Not Currently    Comment: quit 30 years ago  . Sexual activity: Yes  Other Topics Concern  . Not on file  Social History Narrative  . Not on file   Social Determinants of  Health   Financial Resource Strain:   . Difficulty of Paying Living Expenses: Not on file  Food Insecurity:   . Worried About Charity fundraiser in the Last Year: Not on file  . Ran Out of Food in the Last Year: Not on file  Transportation Needs:   . Lack of Transportation (Medical): Not on file  . Lack of Transportation (Non-Medical): Not on file  Physical Activity:   . Days of Exercise per Week: Not on file  . Minutes of  Exercise per Session: Not on file  Stress:   . Feeling of Stress : Not on file  Social Connections:   . Frequency of Communication with Friends and Family: Not on file  . Frequency of Social Gatherings with Friends and Family: Not on file  . Attends Religious Services: Not on file  . Active Member of Clubs or Organizations: Not on file  . Attends Archivist Meetings: Not on file  . Marital Status: Not on file  Intimate Partner Violence:   . Fear of Current or Ex-Partner: Not on file  . Emotionally Abused: Not on file  . Physically Abused: Not on file  . Sexually Abused: Not on file    FAMILY HISTORY:  Family History  Problem Relation Age of Onset  . Heart attack Mother   . Lung cancer Father     CURRENT MEDICATIONS:  Outpatient Encounter Medications as of 09/23/2020  Medication Sig  . albuterol (VENTOLIN HFA) 108 (90 Base) MCG/ACT inhaler Inhale 1-2 puffs into the lungs every 4 (four) hours as needed for wheezing or shortness of breath.   Marland Kitchen aspirin-acetaminophen-caffeine (EXCEDRIN MIGRAINE) 250-250-65 MG tablet Take 2 tablets by mouth every 6 (six) hours as needed for headache.   Marland Kitchen buPROPion (WELLBUTRIN SR) 150 MG 12 hr tablet Take 150 mg by mouth 2 (two) times daily.   . diclofenac sodium (VOLTAREN) 1 % GEL Apply 2 g topically daily as needed (for pain).   Marland Kitchen gabapentin (NEURONTIN) 300 MG capsule Take 300 mg by mouth 2 (two) times daily.   . Lido-Capsaicin-Men-Methyl Sal (LIDOPRO) 4-.324-09-07.5 % OINT LidoPro 4 %-27.5 %-0.0325 % topical ointment  apply to affected area tid prn pain  . lisinopril (PRINIVIL,ZESTRIL) 10 MG tablet Take 1 tablet (10 mg total) by mouth daily.  Marland Kitchen losartan (COZAAR) 50 MG tablet Take 50 mg by mouth daily.   . meloxicam (MOBIC) 15 MG tablet Take 15 mg by mouth daily.   . rizatriptan (MAXALT) 5 MG tablet Take 5 mg by mouth as needed for migraine.   . sildenafil (VIAGRA) 100 MG tablet sildenafil 100 mg tablet  . Sofosbuvir-Velpatasvir 400-100  MG TABS sofosbuvir 400 mg-velpatasvir 100 mg tablet  . tiZANidine (ZANAFLEX) 4 MG tablet Take 4 mg by mouth every 6 (six) hours as needed for muscle spasms.   . traMADol (ULTRAM) 50 MG tablet Take 50 mg by mouth every 6 (six) hours as needed for moderate pain.   . traZODone (DESYREL) 50 MG tablet Take 50 mg by mouth at bedtime as needed for sleep.   . [DISCONTINUED] sildenafil (VIAGRA) 50 MG tablet Take 50 mg by mouth as needed for erectile dysfunction.    Facility-Administered Encounter Medications as of 09/23/2020  Medication  . cyanocobalamin ((VITAMIN B-12)) injection 1,000 mcg  . [COMPLETED] cyanocobalamin ((VITAMIN B-12)) injection 1,000 mcg    ALLERGIES:  Allergies  Allergen Reactions  . Acetaminophen Hives and Itching  . Aspirin Hives and Itching  . Ibuprofen Hives and Itching  .  Penicillins     Turned red and felt "pins and needles" in his feet     PHYSICAL EXAM:  ECOG Performance status: 1  Vitals:   09/23/20 0905  BP: 137/84  Pulse: 64  Resp: 18  Temp: (!) 97.4 F (36.3 C)  SpO2: 96%   Filed Weights   09/23/20 0905  Weight: 197 lb 1.6 oz (89.4 kg)    Physical Exam Constitutional:      Appearance: He is well-developed.  Musculoskeletal:        General: Normal range of motion.  Skin:    General: Skin is warm and dry.  Neurological:     Mental Status: He is alert and oriented to person, place, and time.  Psychiatric:        Behavior: Behavior normal.        Thought Content: Thought content normal.        Judgment: Judgment normal.      LABORATORY DATA:  I have reviewed the labs as listed.  CBC    Component Value Date/Time   WBC 3.1 (L) 08/25/2020 1440   RBC 3.15 (L) 08/25/2020 1440   RBC 3.11 (L) 08/25/2020 1440   HGB 10.9 (L) 08/25/2020 1440   HCT 32.7 (L) 08/25/2020 1440   PLT 87 (L) 08/25/2020 1440   MCV 103.8 (H) 08/25/2020 1440   MCH 34.6 (H) 08/25/2020 1440   MCHC 33.3 08/25/2020 1440   RDW 15.0 08/25/2020 1440   LYMPHSABS 2.0  08/25/2020 1440   MONOABS 0.0 (L) 08/25/2020 1440   EOSABS 0.1 08/25/2020 1440   BASOSABS 0.0 08/25/2020 1440   CMP Latest Ref Rng & Units 08/25/2020 01/27/2019 01/19/2019  Glucose 70 - 99 mg/dL 97 - 120(H)  BUN 6 - 20 mg/dL 17 - 18  Creatinine 0.61 - 1.24 mg/dL 1.21 - 0.89  Sodium 135 - 145 mmol/L 140 - 135  Potassium 3.5 - 5.1 mmol/L 3.8 - 3.9  Chloride 98 - 111 mmol/L 105 - 103  CO2 22 - 32 mmol/L 27 - 25  Calcium 8.9 - 10.3 mg/dL 8.7(L) - 8.5(L)  Total Protein 6.5 - 8.1 g/dL 7.0 7.7 -  Total Bilirubin 0.3 - 1.2 mg/dL 0.6 0.6 -  Alkaline Phos 38 - 126 U/L 40 - -  AST 15 - 41 U/L 15 60(H) -  ALT 0 - 44 U/L 10 61(H) -       DIAGNOSTIC IMAGING:  I have independently reviewed images of the CT scan of the abdomen and pelvis dated 08/13/2018 and discussed with the patient.   ASSESSMENT & PLAN:  1.  Pancytopenia: -CBC from 08/25/2020 shows a white count of 3.1, hemoglobin 10.9, platelet count 87,000. -His B12 levels are low at 176. -Previously received B12 injections and also tried oral B12.  He is not currently on any supplements. -His folate and methylmalonic acid are normal. -His reticulocyte count is slightly elevated at 21.5%.  -LFTs have normalized. -Reports being treated for hep C about a year ago.  2.  Macrocytosis: - MCV 103.8 -This is from combination of B12 deficiency and liver disease. -Plan to start B12 injections weekly x4 and monthly x6.  3.  L4-L5 disc fusion: -Plan is for surgery with Dr. Rolena Infante in the near future. -Awaiting clearance from hematology and his primary care provider.  4.  Elevated reticulocyte count: -We will repeat vitamin B12 and see if counts returned to normal  Disposition: Return to clinic weekly x4 and monthly x6 for B12 injections. Return to clinic  in 1 week for labs only and in 2 months with lab work (CBC, CMP, vitamin B12, folate, reticulocyte count) and assessment.  No problem-specific Assessment & Plan notes found for this  encounter.  Orders placed this encounter:  Orders Placed This Encounter  Procedures  . [place], NP 09/23/2020 10:32 AM  [place] [phone]

## 2020-09-29 ENCOUNTER — Other Ambulatory Visit (HOSPITAL_COMMUNITY): Payer: Self-pay

## 2020-09-29 DIAGNOSIS — D61818 Other pancytopenia: Secondary | ICD-10-CM

## 2020-09-30 ENCOUNTER — Inpatient Hospital Stay (HOSPITAL_COMMUNITY): Payer: 59

## 2020-09-30 ENCOUNTER — Other Ambulatory Visit: Payer: Self-pay

## 2020-09-30 VITALS — BP 159/94 | HR 53 | Temp 97.5°F | Resp 18

## 2020-09-30 DIAGNOSIS — E538 Deficiency of other specified B group vitamins: Secondary | ICD-10-CM | POA: Diagnosis not present

## 2020-09-30 DIAGNOSIS — D61818 Other pancytopenia: Secondary | ICD-10-CM

## 2020-09-30 LAB — CBC WITH DIFFERENTIAL/PLATELET
Abs Immature Granulocytes: 0.01 10*3/uL (ref 0.00–0.07)
Basophils Absolute: 0 10*3/uL (ref 0.0–0.1)
Basophils Relative: 0 %
Eosinophils Absolute: 0.1 10*3/uL (ref 0.0–0.5)
Eosinophils Relative: 2 %
HCT: 35.4 % — ABNORMAL LOW (ref 39.0–52.0)
Hemoglobin: 11.8 g/dL — ABNORMAL LOW (ref 13.0–17.0)
Immature Granulocytes: 0 %
Lymphocytes Relative: 66 %
Lymphs Abs: 2.3 10*3/uL (ref 0.7–4.0)
MCH: 35.2 pg — ABNORMAL HIGH (ref 26.0–34.0)
MCHC: 33.3 g/dL (ref 30.0–36.0)
MCV: 105.7 fL — ABNORMAL HIGH (ref 80.0–100.0)
Monocytes Absolute: 0.1 10*3/uL (ref 0.1–1.0)
Monocytes Relative: 2 %
Neutro Abs: 1 10*3/uL — ABNORMAL LOW (ref 1.7–7.7)
Neutrophils Relative %: 30 %
RBC: 3.35 MIL/uL — ABNORMAL LOW (ref 4.22–5.81)
RDW: 14.8 % (ref 11.5–15.5)
WBC: 3.4 10*3/uL — ABNORMAL LOW (ref 4.0–10.5)
nRBC: 0 % (ref 0.0–0.2)

## 2020-09-30 LAB — COMPREHENSIVE METABOLIC PANEL
ALT: 9 U/L (ref 0–44)
AST: 15 U/L (ref 15–41)
Albumin: 4.2 g/dL (ref 3.5–5.0)
Alkaline Phosphatase: 41 U/L (ref 38–126)
Anion gap: 8 (ref 5–15)
BUN: 18 mg/dL (ref 6–20)
CO2: 28 mmol/L (ref 22–32)
Calcium: 9 mg/dL (ref 8.9–10.3)
Chloride: 101 mmol/L (ref 98–111)
Creatinine, Ser: 1.13 mg/dL (ref 0.61–1.24)
GFR, Estimated: >60 mL/min (ref >60)
Glucose, Bld: 89 mg/dL (ref 70–99)
Potassium: 4.5 mmol/L (ref 3.5–5.1)
Sodium: 137 mmol/L (ref 135–145)
Total Bilirubin: 0.6 mg/dL (ref 0.3–1.2)
Total Protein: 7.5 g/dL (ref 6.5–8.1)

## 2020-09-30 LAB — RETICULOCYTES
Immature Retic Fract: 23.6 % — ABNORMAL HIGH (ref 2.3–15.9)
RBC.: 3.38 MIL/uL — ABNORMAL LOW (ref 4.22–5.81)
Retic Count, Absolute: 57.5 10*3/uL (ref 19.0–186.0)
Retic Ct Pct: 1.7 % (ref 0.4–3.1)

## 2020-09-30 LAB — FOLATE: Folate: 9.8 ng/mL (ref >5.9)

## 2020-09-30 LAB — VITAMIN B12: Vitamin B-12: 240 pg/mL (ref 180–914)

## 2020-09-30 MED ORDER — CYANOCOBALAMIN 1000 MCG/ML IJ SOLN
1000.0000 ug | Freq: Once | INTRAMUSCULAR | Status: AC
  Administered 2020-09-30: 1000 ug via INTRAMUSCULAR
  Filled 2020-09-30: qty 1

## 2020-09-30 NOTE — Progress Notes (Signed)
Patient tolerated Vitamin B 12 injection with no complaints voiced.  Site clean and dry with no bruising or swelling noted.  No complaints of pain.  Discharged with vital signs stable and no signs or symptoms of distress noted.   ?

## 2020-10-10 ENCOUNTER — Inpatient Hospital Stay (HOSPITAL_COMMUNITY): Payer: 59

## 2020-10-14 ENCOUNTER — Inpatient Hospital Stay (HOSPITAL_COMMUNITY): Payer: 59 | Attending: Hematology

## 2020-10-14 ENCOUNTER — Other Ambulatory Visit: Payer: Self-pay

## 2020-10-14 VITALS — BP 152/101 | HR 64 | Temp 97.0°F | Resp 18

## 2020-10-14 DIAGNOSIS — E538 Deficiency of other specified B group vitamins: Secondary | ICD-10-CM | POA: Diagnosis present

## 2020-10-14 DIAGNOSIS — D61818 Other pancytopenia: Secondary | ICD-10-CM

## 2020-10-14 MED ORDER — CYANOCOBALAMIN 1000 MCG/ML IJ SOLN
1000.0000 ug | Freq: Once | INTRAMUSCULAR | Status: AC
  Administered 2020-10-14: 1000 ug via INTRAMUSCULAR
  Filled 2020-10-14: qty 1

## 2020-10-14 NOTE — Progress Notes (Signed)
Patient presented today for Vitamin B-12.  Administered injection to Left Deltoid.  Tolerated well with no complaints.  Patient discharged ambulatory and in stable condition.

## 2020-10-17 ENCOUNTER — Ambulatory Visit (HOSPITAL_COMMUNITY): Payer: 59

## 2020-10-21 ENCOUNTER — Ambulatory Visit (HOSPITAL_COMMUNITY): Payer: 59

## 2020-10-25 ENCOUNTER — Encounter (HOSPITAL_COMMUNITY): Payer: Self-pay

## 2020-10-25 ENCOUNTER — Inpatient Hospital Stay (HOSPITAL_COMMUNITY): Payer: 59

## 2020-10-25 ENCOUNTER — Other Ambulatory Visit: Payer: Self-pay

## 2020-10-25 VITALS — BP 107/65 | HR 70 | Temp 97.4°F | Resp 18

## 2020-10-25 DIAGNOSIS — E538 Deficiency of other specified B group vitamins: Secondary | ICD-10-CM | POA: Diagnosis not present

## 2020-10-25 DIAGNOSIS — D61818 Other pancytopenia: Secondary | ICD-10-CM

## 2020-10-25 MED ORDER — CYANOCOBALAMIN 1000 MCG/ML IJ SOLN
1000.0000 ug | Freq: Once | INTRAMUSCULAR | Status: AC
  Administered 2020-10-25: 1000 ug via INTRAMUSCULAR

## 2020-10-25 NOTE — Patient Instructions (Signed)
[facility] at [facility]  Discharge Instructions:   _______________________________________________________________  Thank you for choosing [facility] at [facility] to provide your oncology and hematology care.  To afford each patient quality time with our providers, please arrive at least 15 minutes before your scheduled appointment.  You need to re-schedule your appointment if you arrive 10 or more minutes late.  We strive to give you quality time with our providers, and arriving late affects you and other patients whose appointments are after yours.  Also, if you no show three or more times for appointments you may be dismissed from the clinic.  Again, thank you for choosing [facility] at [facility]. Our hope is that these requests will allow you access to exceptional care and in a timely manner. _______________________________________________________________  If you have questions after your visit, please contact our office at [phone] between the hours of 8:30 a.m. and 5:00 p.m. Voicemails left after 4:30 p.m. will not be returned until the following business day. _______________________________________________________________  For prescription refill requests, have your pharmacy contact our office. _______________________________________________________________  Recommendations made by the consultant and any test results will be sent to your referring physician. _______________________________________________________________ 

## 2020-10-25 NOTE — Progress Notes (Signed)
Logan Brady presents today for injection per the provider's orders.  B12 administration without incident; injection site WNL; see MAR for injection details.  Patient tolerated procedure well and without incident.  No questions or complaints noted at this time.  Discharged in stable condition ambulatory.

## 2020-11-17 ENCOUNTER — Ambulatory Visit (HOSPITAL_COMMUNITY): Payer: 59

## 2020-11-18 ENCOUNTER — Ambulatory Visit (HOSPITAL_COMMUNITY): Payer: 59

## 2020-11-22 ENCOUNTER — Inpatient Hospital Stay (HOSPITAL_COMMUNITY): Payer: 59 | Attending: Hematology

## 2020-11-22 ENCOUNTER — Other Ambulatory Visit: Payer: Self-pay

## 2020-11-22 VITALS — BP 128/76 | HR 68 | Temp 97.3°F | Resp 16

## 2020-11-22 DIAGNOSIS — E538 Deficiency of other specified B group vitamins: Secondary | ICD-10-CM | POA: Insufficient documentation

## 2020-11-22 DIAGNOSIS — D61818 Other pancytopenia: Secondary | ICD-10-CM

## 2020-11-22 MED ORDER — CYANOCOBALAMIN 1000 MCG/ML IJ SOLN
INTRAMUSCULAR | Status: AC
  Filled 2020-11-22: qty 1

## 2020-11-22 MED ORDER — CYANOCOBALAMIN 1000 MCG/ML IJ SOLN
1000.0000 ug | Freq: Once | INTRAMUSCULAR | Status: AC
  Administered 2020-11-22: 1000 ug via INTRAMUSCULAR

## 2020-11-22 NOTE — Progress Notes (Signed)
Patient tolerated Vitamin B 12 injection with no complaints voiced.  Site clean and dry with no bruising or swelling noted.  No complaints of pain.  Discharged with vital signs stable and no signs or symptoms of distress noted.   ?

## 2020-12-13 ENCOUNTER — Other Ambulatory Visit (HOSPITAL_COMMUNITY): Payer: 59

## 2020-12-19 ENCOUNTER — Other Ambulatory Visit (HOSPITAL_COMMUNITY): Payer: Self-pay | Admitting: *Deleted

## 2020-12-19 ENCOUNTER — Ambulatory Visit (HOSPITAL_COMMUNITY): Payer: 59

## 2020-12-19 ENCOUNTER — Ambulatory Visit (HOSPITAL_COMMUNITY): Payer: 59 | Admitting: Hematology

## 2020-12-19 DIAGNOSIS — D519 Vitamin B12 deficiency anemia, unspecified: Secondary | ICD-10-CM

## 2020-12-19 DIAGNOSIS — D61818 Other pancytopenia: Secondary | ICD-10-CM

## 2020-12-20 ENCOUNTER — Inpatient Hospital Stay (HOSPITAL_COMMUNITY): Payer: 59

## 2020-12-20 ENCOUNTER — Other Ambulatory Visit: Payer: Self-pay

## 2020-12-20 ENCOUNTER — Inpatient Hospital Stay (HOSPITAL_COMMUNITY): Payer: 59 | Attending: Hematology

## 2020-12-20 VITALS — BP 149/94 | HR 73 | Temp 97.3°F | Resp 18

## 2020-12-20 DIAGNOSIS — E538 Deficiency of other specified B group vitamins: Secondary | ICD-10-CM | POA: Diagnosis present

## 2020-12-20 DIAGNOSIS — D61818 Other pancytopenia: Secondary | ICD-10-CM | POA: Diagnosis not present

## 2020-12-20 DIAGNOSIS — Z8249 Family history of ischemic heart disease and other diseases of the circulatory system: Secondary | ICD-10-CM | POA: Insufficient documentation

## 2020-12-20 DIAGNOSIS — Z801 Family history of malignant neoplasm of trachea, bronchus and lung: Secondary | ICD-10-CM | POA: Diagnosis not present

## 2020-12-20 DIAGNOSIS — Z7982 Long term (current) use of aspirin: Secondary | ICD-10-CM | POA: Insufficient documentation

## 2020-12-20 DIAGNOSIS — Z7901 Long term (current) use of anticoagulants: Secondary | ICD-10-CM | POA: Insufficient documentation

## 2020-12-20 DIAGNOSIS — D7589 Other specified diseases of blood and blood-forming organs: Secondary | ICD-10-CM | POA: Diagnosis not present

## 2020-12-20 DIAGNOSIS — F17210 Nicotine dependence, cigarettes, uncomplicated: Secondary | ICD-10-CM | POA: Insufficient documentation

## 2020-12-20 DIAGNOSIS — D519 Vitamin B12 deficiency anemia, unspecified: Secondary | ICD-10-CM

## 2020-12-20 DIAGNOSIS — I10 Essential (primary) hypertension: Secondary | ICD-10-CM | POA: Diagnosis not present

## 2020-12-20 DIAGNOSIS — Z79899 Other long term (current) drug therapy: Secondary | ICD-10-CM | POA: Diagnosis not present

## 2020-12-20 LAB — CBC WITH DIFFERENTIAL/PLATELET
Abs Immature Granulocytes: 0.01 10*3/uL (ref 0.00–0.07)
Basophils Absolute: 0 10*3/uL (ref 0.0–0.1)
Basophils Relative: 0 %
Eosinophils Absolute: 0.1 10*3/uL (ref 0.0–0.5)
Eosinophils Relative: 3 %
HCT: 33.5 % — ABNORMAL LOW (ref 39.0–52.0)
Hemoglobin: 11 g/dL — ABNORMAL LOW (ref 13.0–17.0)
Immature Granulocytes: 0 %
Lymphocytes Relative: 58 %
Lymphs Abs: 2 10*3/uL (ref 0.7–4.0)
MCH: 34.3 pg — ABNORMAL HIGH (ref 26.0–34.0)
MCHC: 32.8 g/dL (ref 30.0–36.0)
MCV: 104.4 fL — ABNORMAL HIGH (ref 80.0–100.0)
Monocytes Absolute: 0 10*3/uL — ABNORMAL LOW (ref 0.1–1.0)
Monocytes Relative: 1 %
Neutro Abs: 1.3 10*3/uL — ABNORMAL LOW (ref 1.7–7.7)
Neutrophils Relative %: 38 %
Platelets: 124 10*3/uL — ABNORMAL LOW (ref 150–400)
RBC: 3.21 MIL/uL — ABNORMAL LOW (ref 4.22–5.81)
RDW: 15.6 % — ABNORMAL HIGH (ref 11.5–15.5)
WBC: 3.4 10*3/uL — ABNORMAL LOW (ref 4.0–10.5)
nRBC: 0 % (ref 0.0–0.2)

## 2020-12-20 LAB — COMPREHENSIVE METABOLIC PANEL
ALT: 9 U/L (ref 0–44)
AST: 15 U/L (ref 15–41)
Albumin: 3.8 g/dL (ref 3.5–5.0)
Alkaline Phosphatase: 41 U/L (ref 38–126)
Anion gap: 6 (ref 5–15)
BUN: 14 mg/dL (ref 6–20)
CO2: 26 mmol/L (ref 22–32)
Calcium: 8.9 mg/dL (ref 8.9–10.3)
Chloride: 105 mmol/L (ref 98–111)
Creatinine, Ser: 0.98 mg/dL (ref 0.61–1.24)
GFR, Estimated: >60 mL/min (ref >60)
Glucose, Bld: 100 mg/dL — ABNORMAL HIGH (ref 70–99)
Potassium: 3.8 mmol/L (ref 3.5–5.1)
Sodium: 137 mmol/L (ref 135–145)
Total Bilirubin: 0.6 mg/dL (ref 0.3–1.2)
Total Protein: 7.7 g/dL (ref 6.5–8.1)

## 2020-12-20 LAB — VITAMIN B12: Vitamin B-12: 327 pg/mL (ref 180–914)

## 2020-12-20 LAB — RETICULOCYTES
Immature Retic Fract: 24.7 % — ABNORMAL HIGH (ref 2.3–15.9)
RBC.: 3.21 MIL/uL — ABNORMAL LOW (ref 4.22–5.81)
Retic Count, Absolute: 67.1 10*3/uL (ref 19.0–186.0)
Retic Ct Pct: 2.1 % (ref 0.4–3.1)

## 2020-12-20 LAB — FOLATE: Folate: 18.9 ng/mL (ref >5.9)

## 2020-12-20 MED ORDER — CYANOCOBALAMIN 1000 MCG/ML IJ SOLN
INTRAMUSCULAR | Status: AC
  Filled 2020-12-20: qty 1

## 2020-12-20 MED ORDER — CYANOCOBALAMIN 1000 MCG/ML IJ SOLN
1000.0000 ug | Freq: Once | INTRAMUSCULAR | Status: AC
  Administered 2020-12-20: 1000 ug via INTRAMUSCULAR

## 2020-12-20 NOTE — Patient Instructions (Signed)
[facility] Discharge Instructions for Patients Receiving Chemotherapy  Today you received the following chemotherapy agents   To help prevent nausea and vomiting after your treatment, we encourage you to take your nausea medication   If you develop nausea and vomiting that is not controlled by your nausea medication, call the clinic.   BELOW ARE SYMPTOMS THAT SHOULD BE REPORTED IMMEDIATELY:  *FEVER GREATER THAN 100.5 F  *CHILLS WITH OR WITHOUT FEVER  NAUSEA AND VOMITING THAT IS NOT CONTROLLED WITH YOUR NAUSEA MEDICATION  *UNUSUAL SHORTNESS OF BREATH  *UNUSUAL BRUISING OR BLEEDING  TENDERNESS IN MOUTH AND THROAT WITH OR WITHOUT PRESENCE OF ULCERS  *URINARY PROBLEMS  *BOWEL PROBLEMS  UNUSUAL RASH Items with * indicate a potential emergency and should be followed up as soon as possible.  Feel free to call the clinic should you have any questions or concerns. The clinic phone number is [phone].  Please show the CHEMO ALERT CARD at check-in to the Emergency Department and triage nurse.   

## 2020-12-20 NOTE — Progress Notes (Signed)
Patient presents today for B12 injection.  Vital signs WNL.  Patient complains of chronic back pain and takes medication at home for it.  B12 injection given in the left deltoid.  Injection site WNL. Tolerated injection without adverse affects.  Vital signs stable.  No complaints at this time.  Discharge from clinic ambulatory in stable condition.  Alert and oriented X 3.  Follow up with [facility] as scheduled.

## 2020-12-22 ENCOUNTER — Other Ambulatory Visit: Payer: Self-pay

## 2020-12-22 ENCOUNTER — Inpatient Hospital Stay (HOSPITAL_BASED_OUTPATIENT_CLINIC_OR_DEPARTMENT_OTHER): Payer: 59 | Admitting: Hematology

## 2020-12-22 VITALS — BP 149/94 | HR 68 | Temp 97.3°F | Resp 18 | Wt 196.3 lb

## 2020-12-22 DIAGNOSIS — D61818 Other pancytopenia: Secondary | ICD-10-CM | POA: Diagnosis not present

## 2020-12-22 DIAGNOSIS — E538 Deficiency of other specified B group vitamins: Secondary | ICD-10-CM | POA: Diagnosis not present

## 2020-12-22 NOTE — Patient Instructions (Signed)
[place] at [facility] Discharge Instructions  You were seen today by Dr. Delton Coombes. He went over your recent results. Continue getting your vitamin B12 injections every month. You may proceed with your orthopedic surgery. Dr. Delton Coombes will see you back in 4 months for labs and follow up.   Thank you for choosing [place] at [facility] to provide your oncology and hematology care.  To afford each patient quality time with our provider, please arrive at least 15 minutes before your scheduled appointment time.   If you have a lab appointment with the [facility] please come in thru the Main Entrance and check in at the main information desk  You need to re-schedule your appointment should you arrive 10 or more minutes late.  We strive to give you quality time with our providers, and arriving late affects you and other patients whose appointments are after yours.  Also, if you no show three or more times for appointments you may be dismissed from the clinic at the providers discretion.     Again, thank you for choosing [facility].  Our hope is that these requests will decrease the amount of time that you wait before being seen by our physicians.       _____________________________________________________________  Should you have questions after your visit to [facility], please contact our office at [phone] between the hours of 8:00 a.m. and 4:30 p.m.  Voicemails left after 4:00 p.m. will not be returned until the following business day.  For prescription refill requests, have your pharmacy contact our office and allow 72 hours.    Cancer Center Support Programs:   > Cancer Support Group  2nd Tuesday of the month 1pm-2pm, Journey Room

## 2020-12-22 NOTE — Progress Notes (Signed)
[place] [place], [place] [address]   CLINIC:  Medical Oncology/Hematology  PCP:  Celene Squibb, MD [address] [place] [address]  [phone]  REASON FOR VISIT:  Follow-up for pancytopenia  PRIOR THERAPY: None  CURRENT THERAPY: Vitamin B12 injections  INTERVAL HISTORY:  [name], a 60 y.o. male, returns for routine follow-up for his pancytopenia. Logan Brady was last seen by Faythe Casa, NP, on 09/23/2020.  Today he reports feeling well. He denies having any changes in his overall health recently. He is going to have orthopedic surgery for anterior fusion of L3-L4 and L4-S1, as well as posterior fusion L3-S1. He got hurt on the job on October 2020. He continues taking B12 monthly injections. He was treated for HCV for 3 months. He denies having any easy bruising, nosebleeds, hematuria or hematochezia.  He was sick with COVID pneumonia in December and just stopped taking Eliquis on 01/27.   REVIEW OF SYSTEMS:  Review of Systems  Constitutional: Positive for fatigue (75%). Negative for appetite change.  HENT:   Negative for nosebleeds.   Gastrointestinal: Negative for blood in stool.  Genitourinary: Negative for hematuria.   Hematological: Does not bruise/bleed easily.  All other systems reviewed and are negative.   PAST MEDICAL/SURGICAL HISTORY:  Past Medical History:  Diagnosis Date  . Degeneration of lumbar intervertebral disc   . Hepatitis C   . Hepatitis C 01/27/2019  . Hypertension   . Lumbar radiculopathy   . Spinal stenosis of lumbar region   . Spondylolysis    CHRONIC BILATERAL.  GRADE L5-S1.  . Tobacco abuse    No past surgical history on file.  SOCIAL HISTORY:  Social History   Socioeconomic History  . Marital status: Divorced    Spouse name: Not on file  . Number of children: 4  . Years of education: 58  . Highest education level: High school graduate  Occupational History  . Occupation: maintenance   Tobacco Use  . Smoking status: Current Every Day Smoker    Packs/day: 1.00    Years: 40.00    Pack years: 40.00  . Smokeless tobacco: Never Used  Vaping Use  . Vaping Use: Never used  Substance and Sexual Activity  . Alcohol use: Not Currently  . Drug use: Not Currently    Comment: quit 30 years ago  . Sexual activity: Yes  Other Topics Concern  . Not on file  Social History Narrative  . Not on file   Social Determinants of Health   Financial Resource Strain: Not on file  Food Insecurity: Not on file  Transportation Needs: Not on file  Physical Activity: Not on file  Stress: Not on file  Social Connections: Not on file  Intimate Partner Violence: Not on file    FAMILY HISTORY:  Family History  Problem Relation Age of Onset  . Heart attack Mother   . Lung cancer Father     CURRENT MEDICATIONS:  Current Outpatient Medications  Medication Sig Dispense Refill  . albuterol (VENTOLIN HFA) 108 (90 Base) MCG/ACT inhaler Inhale 1-2 puffs into the lungs every 4 (four) hours as needed for wheezing or shortness of breath.     Marland Kitchen aspirin-acetaminophen-caffeine (EXCEDRIN MIGRAINE) 250-250-65 MG tablet Take 2 tablets by mouth every 6 (six) hours as needed for headache.     Marland Kitchen buPROPion (WELLBUTRIN SR) 150 MG 12 hr tablet Take 150 mg by mouth 2 (two) times daily.   1  .  diclofenac sodium (VOLTAREN) 1 % GEL Apply 2 g topically daily as needed (for pain).   5  . ELIQUIS 2.5 MG TABS tablet Take 2.5 mg by mouth 2 (two) times daily.    Marland Kitchen gabapentin (NEURONTIN) 300 MG capsule Take 300 mg by mouth 2 (two) times daily.     . Lido-Capsaicin-Men-Methyl Sal (LIDOPRO) 4-.324-09-07.5 % OINT LidoPro 4 %-27.5 %-0.0325 % topical ointment  apply to affected area tid prn pain    . meloxicam (MOBIC) 15 MG tablet Take 15 mg by mouth daily.     . rizatriptan (MAXALT) 5 MG tablet Take 5 mg by mouth as needed for migraine.     . sildenafil (VIAGRA) 100 MG tablet sildenafil 100 mg tablet    .  Sofosbuvir-Velpatasvir 400-100 MG TABS sofosbuvir 400 mg-velpatasvir 100 mg tablet    . tiZANidine (ZANAFLEX) 4 MG tablet Take 4 mg by mouth every 6 (six) hours as needed for muscle spasms.   1  . traMADol (ULTRAM) 50 MG tablet Take 50 mg by mouth every 6 (six) hours as needed for moderate pain.     . traZODone (DESYREL) 50 MG tablet Take 50 mg by mouth at bedtime as needed for sleep.   1   No current facility-administered medications for this visit.   Facility-Administered Medications Ordered in Other Visits  Medication Dose Route Frequency Provider Last Rate Last Admin  . cyanocobalamin ((VITAMIN B-12)) injection 1,000 mcg  1,000 mcg Intramuscular Once Derek Jack, MD        ALLERGIES:  Allergies  Allergen Reactions  . Acetaminophen Hives and Itching  . Aspirin Hives and Itching  . Ibuprofen Hives and Itching  . Penicillins     Turned red and felt "pins and needles" in his feet    PHYSICAL EXAM:  Performance status (ECOG): 1 - Symptomatic but completely ambulatory  Vitals:   12/22/20 1555  BP: (!) 149/94  Pulse: 68  Resp: 18  Temp: (!) 97.3 F (36.3 C)  SpO2: 99%   Wt Readings from Last 3 Encounters:  12/22/20 196 lb 4.8 oz (89 kg)  09/23/20 197 lb 1.6 oz (89.4 kg)  08/29/20 192 lb (87.1 kg)   Physical Exam Vitals reviewed.  Constitutional:      Appearance: Normal appearance.  Cardiovascular:     Rate and Rhythm: Normal rate and regular rhythm.     Pulses: Normal pulses.     Heart sounds: Normal heart sounds.  Pulmonary:     Effort: Pulmonary effort is normal.     Breath sounds: Normal breath sounds.  Abdominal:     Palpations: Abdomen is soft. There is no hepatomegaly, splenomegaly or mass.     Tenderness: There is no abdominal tenderness.     Hernia: No hernia is present.  Musculoskeletal:     Right lower leg: No edema.     Left lower leg: No edema.  Lymphadenopathy:     Lower Body: No right inguinal adenopathy. No left inguinal adenopathy.   Neurological:     General: No focal deficit present.     Mental Status: He is alert and oriented to person, place, and time.  Psychiatric:        Mood and Affect: Mood normal.        Behavior: Behavior normal.     LABORATORY DATA:  I have reviewed the labs as listed.  CBC Latest Ref Rng & Units 12/20/2020 09/30/2020 08/25/2020  WBC 4.0 - 10.5 K/uL 3.4(L) 3.4(L) 3.1(L)  Hemoglobin 13.0 -  17.0 g/dL 11.0(L) 11.8(L) 10.9(L)  Hematocrit 39.0 - 52.0 % 33.5(L) 35.4(L) 32.7(L)  Platelets 150 - 400 K/uL 124(L) Not Measured 87(L)   CMP Latest Ref Rng & Units 12/20/2020 09/30/2020 08/25/2020  Glucose 70 - 99 mg/dL 100(H) 89 97  BUN 6 - 20 mg/dL 14 18 17   Creatinine 0.61 - 1.24 mg/dL 0.98 1.13 1.21  Sodium 135 - 145 mmol/L 137 137 140  Potassium 3.5 - 5.1 mmol/L 3.8 4.5 3.8  Chloride 98 - 111 mmol/L 105 101 105  CO2 22 - 32 mmol/L 26 28 27   Calcium 8.9 - 10.3 mg/dL 8.9 9.0 8.7(L)  Total Protein 6.5 - 8.1 g/dL 7.7 7.5 7.0  Total Bilirubin 0.3 - 1.2 mg/dL 0.6 0.6 0.6  Alkaline Phos 38 - 126 U/L 41 41 40  AST 15 - 41 U/L 15 15 15   ALT 0 - 44 U/L 9 9 10       Component Value Date/Time   RBC 3.21 (L) 12/20/2020 1455   RBC 3.21 (L) 12/20/2020 1455   MCV 104.4 (H) 12/20/2020 1455   MCH 34.3 (H) 12/20/2020 1455   MCHC 32.8 12/20/2020 1455   RDW 15.6 (H) 12/20/2020 1455   LYMPHSABS 2.0 12/20/2020 1455   MONOABS 0.0 (L) 12/20/2020 1455   EOSABS 0.1 12/20/2020 1455   BASOSABS 0.0 12/20/2020 1455    DIAGNOSTIC IMAGING:  I have independently reviewed the scans and discussed with the patient. No results found.   ASSESSMENT:  1.  Pancytopenia: -CBC on 04/23/2018 shows white count of 2.4, platelet count of 88 with an MCV of 107. -This was repeated on 05/01/2018 when the white count was 2.9, platelet count of 107 with a normal differential. -CTAP from 08/13/2018 showed fatty infiltration of the liver with mild splenomegaly.  2.  Macrocytosis: -This is from combination of B12 deficiency and  liver disease.   PLAN:  1.  Pancytopenia: -At last visit he was started on vitamin B12 injections. -His platelet count improved to 124. -He did not have any major surgeries in the past.  However he had wisdom tooth extraction few years ago without any problems.  No easy bruising or bleeding. -He is planning to have anterior and posterior fusion of the lumbar vertebra. -His ANC is 1.3 with white count of 3.4. -He is at acceptable risk for his spinal surgery by Dr. Rolena Infante. -RTC 4 months for follow-up.  2.  Macrocytosis: -MCV is 104.4 and hemoglobin 11.  Likely from liver disease.  Q73, folic acid were within normal limits.   Orders placed this encounter:  No orders of the defined types were placed in this encounter.    Derek Jack, MD [facility] [phone]   I, Milinda Antis, am acting as a scribe for Dr. Sanda Linger.  I, Derek Jack MD, have reviewed the above documentation for accuracy and completeness, and I agree with the above.

## 2021-01-17 ENCOUNTER — Inpatient Hospital Stay (HOSPITAL_COMMUNITY): Payer: 59 | Attending: Hematology

## 2021-01-17 ENCOUNTER — Encounter (HOSPITAL_COMMUNITY): Payer: Self-pay

## 2021-01-17 ENCOUNTER — Other Ambulatory Visit: Payer: Self-pay

## 2021-01-17 VITALS — BP 125/83 | HR 63 | Temp 97.3°F | Resp 18

## 2021-01-17 DIAGNOSIS — D61818 Other pancytopenia: Secondary | ICD-10-CM

## 2021-01-17 DIAGNOSIS — E538 Deficiency of other specified B group vitamins: Secondary | ICD-10-CM | POA: Diagnosis present

## 2021-01-17 MED ORDER — CYANOCOBALAMIN 1000 MCG/ML IJ SOLN
1000.0000 ug | Freq: Once | INTRAMUSCULAR | Status: AC
  Administered 2021-01-17: 1000 ug via INTRAMUSCULAR

## 2021-01-17 MED ORDER — CYANOCOBALAMIN 1000 MCG/ML IJ SOLN
INTRAMUSCULAR | Status: AC
  Filled 2021-01-17: qty 1

## 2021-01-17 NOTE — Progress Notes (Signed)
Patient tolerated injection with no complaints voiced.  Site clean and dry with no bruising or swelling noted at site.  See MAR for details.  Band aid applied.  Patient stable during and after injection.  Vss with discharge and left in satisfactory condition with no s/s of distress noted.  

## 2021-02-17 ENCOUNTER — Inpatient Hospital Stay (HOSPITAL_COMMUNITY): Payer: 59 | Attending: Hematology

## 2021-02-17 ENCOUNTER — Other Ambulatory Visit: Payer: Self-pay

## 2021-02-17 ENCOUNTER — Encounter (HOSPITAL_COMMUNITY): Payer: Self-pay

## 2021-02-17 VITALS — BP 151/92 | HR 61 | Temp 97.1°F | Resp 18

## 2021-02-17 DIAGNOSIS — E538 Deficiency of other specified B group vitamins: Secondary | ICD-10-CM | POA: Diagnosis not present

## 2021-02-17 DIAGNOSIS — D61818 Other pancytopenia: Secondary | ICD-10-CM

## 2021-02-17 MED ORDER — CYANOCOBALAMIN 1000 MCG/ML IJ SOLN
1000.0000 ug | Freq: Once | INTRAMUSCULAR | Status: AC
  Administered 2021-02-17: 1000 ug via INTRAMUSCULAR
  Filled 2021-02-17: qty 1

## 2021-02-17 NOTE — Progress Notes (Signed)
Patient tolerated B12 injection with no complaints voiced. Site clean and dry with no bruising or swelling noted at site. See MAR for details. Band aid applied.  Patient stable during and after injection. VSS with discharge and left in satisfactory condition with no s/s of distress noted. 

## 2021-02-17 NOTE — Patient Instructions (Signed)
[facility]  Discharge Instructions:  You received your B12 injection today, return as scheduled. _______________________________________________________________  Thank you for choosing [facility] at [facility] to provide your oncology and hematology care.  To afford each patient quality time with our providers, please arrive at least 15 minutes before your scheduled appointment.  You need to re-schedule your appointment if you arrive 10 or more minutes late.  We strive to give you quality time with our providers, and arriving late affects you and other patients whose appointments are after yours.  Also, if you no show three or more times for appointments you may be dismissed from the clinic.  Again, thank you for choosing [facility] at [facility] hope is that these requests will allow you access to exceptional care and in a timely manner. _______________________________________________________________  If you have questions after your visit, please contact our office at [phone] between the hours of 8:30 a.m. and 5:00 p.m. Voicemails left after 4:30 p.m. will not be returned until the following business day. _______________________________________________________________  For prescription refill requests, have your pharmacy contact our office. _______________________________________________________________  Recommendations made by the consultant and any test results will be sent to your referring physician. _______________________________________________________________

## 2021-03-17 ENCOUNTER — Ambulatory Visit (HOSPITAL_COMMUNITY): Payer: 59

## 2021-03-17 MED ORDER — CYANOCOBALAMIN 1000 MCG/ML IJ SOLN: 1000.0000 ug | Freq: Once | INTRAMUSCULAR | Status: AC

## 2021-03-22 ENCOUNTER — Inpatient Hospital Stay (HOSPITAL_COMMUNITY): Payer: 59 | Attending: Hematology

## 2021-03-22 ENCOUNTER — Encounter (HOSPITAL_COMMUNITY): Payer: Self-pay

## 2021-03-22 VITALS — BP 131/91 | HR 65 | Temp 97.1°F | Resp 17

## 2021-03-22 DIAGNOSIS — E538 Deficiency of other specified B group vitamins: Secondary | ICD-10-CM | POA: Diagnosis present

## 2021-03-22 DIAGNOSIS — D61818 Other pancytopenia: Secondary | ICD-10-CM

## 2021-03-22 MED ORDER — CYANOCOBALAMIN 1000 MCG/ML IJ SOLN
INTRAMUSCULAR | Status: AC
  Filled 2021-03-22: qty 1

## 2021-03-22 MED ORDER — CYANOCOBALAMIN 1000 MCG/ML IJ SOLN
1000.0000 ug | Freq: Once | INTRAMUSCULAR | Status: AC
  Administered 2021-03-22: 1000 ug via INTRAMUSCULAR

## 2021-03-22 NOTE — Patient Instructions (Signed)
[facility]  Discharge Instructions: Thank you for choosing [facility] to provide your oncology and hematology care.  If you have a lab appointment with the [facility], please come in thru the Main Entrance and check in at the main information desk.  Wear comfortable clothing and clothing appropriate for easy access to any Portacath or PICC line.   We strive to give you quality time with your provider. You may need to reschedule your appointment if you arrive late (15 or more minutes).  Arriving late affects you and other patients whose appointments are after yours.  Also, if you miss three or more appointments without notifying the office, you may be dismissed from the clinic at the provider's discretion.      For prescription refill requests, have your pharmacy contact our office and allow 72 hours for refills to be completed.    B12 injection today.   To help prevent nausea and vomiting after your treatment, we encourage you to take your nausea medication as directed.  BELOW ARE SYMPTOMS THAT SHOULD BE REPORTED IMMEDIATELY: . *FEVER GREATER THAN 100.4 F (38 C) OR HIGHER . *CHILLS OR SWEATING . *NAUSEA AND VOMITING THAT IS NOT CONTROLLED WITH YOUR NAUSEA MEDICATION . *UNUSUAL SHORTNESS OF BREATH . *UNUSUAL BRUISING OR BLEEDING . *URINARY PROBLEMS (pain or burning when urinating, or frequent urination) . *BOWEL PROBLEMS (unusual diarrhea, constipation, pain near the anus) . TENDERNESS IN MOUTH AND THROAT WITH OR WITHOUT PRESENCE OF ULCERS (sore throat, sores in mouth, or a toothache) . UNUSUAL RASH, SWELLING OR PAIN  . UNUSUAL VAGINAL DISCHARGE OR ITCHING   Items with * indicate a potential emergency and should be followed up as soon as possible or go to the Emergency Department if any problems should occur.  Please show the CHEMOTHERAPY ALERT CARD or IMMUNOTHERAPY ALERT CARD at check-in to the Emergency Department and triage nurse.  Should you have  questions after your visit or need to cancel or reschedule your appointment, please contact [facility] [phone]  and follow the prompts.  Office hours are 8:00 a.m. to 4:30 p.m. Monday - Friday. Please note that voicemails left after 4:00 p.m. may not be returned until the following business day.  We are closed weekends and major holidays. You have access to a nurse at all times for urgent questions. Please call the main number to the clinic [phone] and follow the prompts.  For any non-urgent questions, you may also contact your provider using MyChart. We now offer e-Visits for anyone 67 and older to request care online for non-urgent symptoms. For details visit [email or web address].   Also download the MyChart app! Go to the app store, search "MyChart", open the app, select [facility], and log in with your MyChart username and password.  Due to Covid, a mask is required upon entering the hospital/clinic. If you do not have a mask, one will be given to you upon arrival. For doctor visits, patients may have 1 support person aged 68 or older with them. For treatment visits, patients cannot have anyone with them due to current Covid guidelines and our immunocompromised population.

## 2021-03-22 NOTE — Progress Notes (Signed)
Pt here for B12 injection monthly.  Last injection 02/17/21. Vital signs stable for injection.  Logan Brady presents today for injection per the provider's orders.  B12 in left arm administration without incident; injection site WNL; see MAR for injection details.  Patient tolerated procedure well and without incident.  No questions or complaints noted at this time.  Stable during and after injection.  AVS reviewed.  Discharged in stable condition ambulatory.

## 2021-04-04 ENCOUNTER — Other Ambulatory Visit (HOSPITAL_COMMUNITY): Payer: Self-pay

## 2021-04-04 DIAGNOSIS — D61818 Other pancytopenia: Secondary | ICD-10-CM

## 2021-04-04 DIAGNOSIS — D519 Vitamin B12 deficiency anemia, unspecified: Secondary | ICD-10-CM

## 2021-04-17 ENCOUNTER — Other Ambulatory Visit: Payer: Self-pay

## 2021-04-17 ENCOUNTER — Inpatient Hospital Stay (HOSPITAL_COMMUNITY): Payer: 59 | Attending: Hematology

## 2021-04-17 ENCOUNTER — Inpatient Hospital Stay (HOSPITAL_COMMUNITY): Payer: 59

## 2021-04-17 VITALS — BP 147/96 | HR 57 | Temp 97.0°F | Resp 18

## 2021-04-17 DIAGNOSIS — Z7901 Long term (current) use of anticoagulants: Secondary | ICD-10-CM | POA: Diagnosis not present

## 2021-04-17 DIAGNOSIS — D519 Vitamin B12 deficiency anemia, unspecified: Secondary | ICD-10-CM

## 2021-04-17 DIAGNOSIS — Z79899 Other long term (current) drug therapy: Secondary | ICD-10-CM | POA: Diagnosis not present

## 2021-04-17 DIAGNOSIS — D61818 Other pancytopenia: Secondary | ICD-10-CM

## 2021-04-17 DIAGNOSIS — E538 Deficiency of other specified B group vitamins: Secondary | ICD-10-CM | POA: Diagnosis not present

## 2021-04-17 DIAGNOSIS — I10 Essential (primary) hypertension: Secondary | ICD-10-CM | POA: Diagnosis not present

## 2021-04-17 LAB — CBC WITH DIFFERENTIAL/PLATELET
Abs Immature Granulocytes: 0.01 10*3/uL (ref 0.00–0.07)
Basophils Absolute: 0 10*3/uL (ref 0.0–0.1)
Basophils Relative: 1 %
Eosinophils Absolute: 0.1 10*3/uL (ref 0.0–0.5)
Eosinophils Relative: 3 %
HCT: 33.6 % — ABNORMAL LOW (ref 39.0–52.0)
Hemoglobin: 11 g/dL — ABNORMAL LOW (ref 13.0–17.0)
Immature Granulocytes: 0 %
Lymphocytes Relative: 59 %
Lymphs Abs: 1.8 10*3/uL (ref 0.7–4.0)
MCH: 34.2 pg — ABNORMAL HIGH (ref 26.0–34.0)
MCHC: 32.7 g/dL (ref 30.0–36.0)
MCV: 104.3 fL — ABNORMAL HIGH (ref 80.0–100.0)
Monocytes Absolute: 0 10*3/uL — ABNORMAL LOW (ref 0.1–1.0)
Monocytes Relative: 1 %
Neutro Abs: 1.1 10*3/uL — ABNORMAL LOW (ref 1.7–7.7)
Neutrophils Relative %: 36 %
Platelets: 93 10*3/uL — ABNORMAL LOW (ref 150–400)
RBC: 3.22 MIL/uL — ABNORMAL LOW (ref 4.22–5.81)
RDW: 14.9 % (ref 11.5–15.5)
WBC: 3 10*3/uL — ABNORMAL LOW (ref 4.0–10.5)
nRBC: 0 % (ref 0.0–0.2)

## 2021-04-17 LAB — VITAMIN B12: Vitamin B-12: 260 pg/mL (ref 180–914)

## 2021-04-17 MED ORDER — CYANOCOBALAMIN 1000 MCG/ML IJ SOLN
1000.0000 ug | Freq: Once | INTRAMUSCULAR | Status: AC
  Administered 2021-04-17: 1000 ug via INTRAMUSCULAR

## 2021-04-17 MED ORDER — CYANOCOBALAMIN 1000 MCG/ML IJ SOLN
INTRAMUSCULAR | Status: AC
  Filled 2021-04-17: qty 1

## 2021-04-17 NOTE — Patient Instructions (Signed)
[facility]  Discharge Instructions: Thank you for choosing [facility] to provide your oncology and hematology care.  If you have a lab appointment with the Cancer Center, please come in thru the Main Entrance and check in at the main information desk.  Wear comfortable clothing and clothing appropriate for easy access to any Portacath or PICC line.   We strive to give you quality time with your provider. You may need to reschedule your appointment if you arrive late (15 or more minutes).  Arriving late affects you and other patients whose appointments are after yours.  Also, if you miss three or more appointments without notifying the office, you may be dismissed from the clinic at the provider's discretion.      For prescription refill requests, have your pharmacy contact our office and allow 72 hours for refills to be completed.        To help prevent nausea and vomiting after your treatment, we encourage you to take your nausea medication as directed.  BELOW ARE SYMPTOMS THAT SHOULD BE REPORTED IMMEDIATELY: *FEVER GREATER THAN 100.4 F (38 C) OR HIGHER *CHILLS OR SWEATING *NAUSEA AND VOMITING THAT IS NOT CONTROLLED WITH YOUR NAUSEA MEDICATION *UNUSUAL SHORTNESS OF BREATH *UNUSUAL BRUISING OR BLEEDING *URINARY PROBLEMS (pain or burning when urinating, or frequent urination) *BOWEL PROBLEMS (unusual diarrhea, constipation, pain near the anus) TENDERNESS IN MOUTH AND THROAT WITH OR WITHOUT PRESENCE OF ULCERS (sore throat, sores in mouth, or a toothache) UNUSUAL RASH, SWELLING OR PAIN  UNUSUAL VAGINAL DISCHARGE OR ITCHING   Items with * indicate a potential emergency and should be followed up as soon as possible or go to the Emergency Department if any problems should occur.  Please show the CHEMOTHERAPY ALERT CARD or IMMUNOTHERAPY ALERT CARD at check-in to the Emergency Department and triage nurse.  Should you have questions after your visit or need to cancel  or reschedule your appointment, please contact [facility] [phone]  and follow the prompts.  Office hours are 8:00 a.m. to 4:30 p.m. Monday - Friday. Please note that voicemails left after 4:00 p.m. may not be returned until the following business day.  We are closed weekends and major holidays. You have access to a nurse at all times for urgent questions. Please call the main number to the clinic [phone] and follow the prompts.  For any non-urgent questions, you may also contact your provider using MyChart. We now offer e-Visits for anyone 18 and older to request care online for non-urgent symptoms. For details visit [email or web address].   Also download the MyChart app! Go to the app store, search "MyChart", open the app, select [facility], and log in with your MyChart username and password.  Due to Covid, a mask is required upon entering the hospital/clinic. If you do not have a mask, one will be given to you upon arrival. For doctor visits, patients may have 1 support person aged 18 or older with them. For treatment visits, patients cannot have anyone with them due to current Covid guidelines and our immunocompromised population.  

## 2021-04-17 NOTE — Progress Notes (Signed)
Logan Brady presents today for B12 injection per the provider's orders. Stable during administration without incident; injection site WNL; see MAR for injection details.  Patient tolerated procedure well and without incident.  No questions or complaints noted at this time.

## 2021-04-18 LAB — PROTEIN ELECTROPHORESIS, SERUM
A/G Ratio: 1.3 (ref 0.7–1.7)
Albumin ELP: 4 g/dL (ref 2.9–4.4)
Alpha-1-Globulin: 0.2 g/dL (ref 0.0–0.4)
Alpha-2-Globulin: 0.8 g/dL (ref 0.4–1.0)
Beta Globulin: 0.8 g/dL (ref 0.7–1.3)
Gamma Globulin: 1.3 g/dL (ref 0.4–1.8)
Globulin, Total: 3.1 g/dL (ref 2.2–3.9)
Total Protein ELP: 7.1 g/dL (ref 6.0–8.5)

## 2021-04-18 LAB — KAPPA/LAMBDA LIGHT CHAINS
Kappa free light chain: 46.4 mg/L — ABNORMAL HIGH (ref 3.3–19.4)
Kappa, lambda light chain ratio: 2.18 — ABNORMAL HIGH (ref 0.26–1.65)
Lambda free light chains: 21.3 mg/L (ref 5.7–26.3)

## 2021-04-20 NOTE — Progress Notes (Signed)
[address], [place] [address]   CLINIC:  Medical Oncology/Hematology  PCP:  Celene Squibb, MD [address] [place] [address]  [phone]  REASON FOR VISIT:  Follow-up for pancytopenia  PRIOR THERAPY: none  CURRENT THERAPY: Vitamin B-12 injection  INTERVAL HISTORY:  [name], a 60 y.o. male, returns for routine follow-up for his pancytopenia. Logan Brady was last seen on 12/22/2020.  Today he reports feeling well. He had an MRI Lumbar Spine ordered by Dr. Rolena Infante on 02/17/2021. He has no family history of blood cancer or multiple myeloma. He reports his father was a smoker and had lung cancer. He has redness and white-heads on his nose, but otherwise denies any infection within the past 4 months.   REVIEW OF SYSTEMS:  Review of Systems  Constitutional:  Positive for appetite change (50%) and fatigue (75%).  Musculoskeletal:  Positive for back pain (7/10).  All other systems reviewed and are negative.  PAST MEDICAL/SURGICAL HISTORY:  Past Medical History:  Diagnosis Date   Degeneration of lumbar intervertebral disc    Hepatitis C    Hepatitis C 01/27/2019   Hypertension    Lumbar radiculopathy    Spinal stenosis of lumbar region    Spondylolysis    CHRONIC BILATERAL.  GRADE L5-S1.   Tobacco abuse    No past surgical history on file.  SOCIAL HISTORY:  Social History   Socioeconomic History   Marital status: Divorced    Spouse name: Not on file   Number of children: 4   Years of education: 12   Highest education level: High school graduate  Occupational History   Occupation: maintenance  Tobacco Use   Smoking status: Every Day    Packs/day: 1.00    Years: 40.00    Pack years: 40.00    Types: Cigarettes   Smokeless tobacco: Never  Vaping Use   Vaping Use: Never used  Substance and Sexual Activity   Alcohol use: Not Currently   Drug use: Not Currently    Comment: quit 30 years ago   Sexual activity: Yes   Other Topics Concern   Not on file  Social History Narrative   Not on file   Social Determinants of Health   Financial Resource Strain: Not on file  Food Insecurity: Not on file  Transportation Needs: Not on file  Physical Activity: Not on file  Stress: Not on file  Social Connections: Not on file  Intimate Partner Violence: Not on file    FAMILY HISTORY:  Family History  Problem Relation Age of Onset   Heart attack Mother    Lung cancer Father     CURRENT MEDICATIONS:  Current Outpatient Medications  Medication Sig Dispense Refill   albuterol (VENTOLIN HFA) 108 (90 Base) MCG/ACT inhaler Inhale 1-2 puffs into the lungs every 4 (four) hours as needed for wheezing or shortness of breath.      aspirin-acetaminophen-caffeine (EXCEDRIN MIGRAINE) 250-250-65 MG tablet Take 2 tablets by mouth every 6 (six) hours as needed for headache.      buPROPion (WELLBUTRIN SR) 150 MG 12 hr tablet Take 150 mg by mouth 2 (two) times daily.   1   diclofenac sodium (VOLTAREN) 1 % GEL Apply 2 g topically daily as needed (for pain).  5   ELIQUIS 2.5 MG TABS tablet Take 2.5 mg by mouth 2 (two) times daily.     gabapentin (NEURONTIN) 300 MG capsule Take 300 mg by  mouth 2 (two) times daily.      Lido-Capsaicin-Men-Methyl Sal (LIDOPRO) 4-.324-09-07.5 % OINT      meloxicam (MOBIC) 15 MG tablet Take 15 mg by mouth daily.     rizatriptan (MAXALT) 5 MG tablet Take 5 mg by mouth as needed for migraine.      sildenafil (VIAGRA) 100 MG tablet      Sofosbuvir-Velpatasvir 400-100 MG TABS sofosbuvir 400 mg-velpatasvir 100 mg tablet     tiZANidine (ZANAFLEX) 4 MG tablet Take 4 mg by mouth every 6 (six) hours as needed for muscle spasms.   1   traMADol (ULTRAM) 50 MG tablet Take 50 mg by mouth every 6 (six) hours as needed for moderate pain.     traZODone (DESYREL) 50 MG tablet Take 50 mg by mouth at bedtime as needed for sleep.   1   No current facility-administered medications for this visit.    Facility-Administered Medications Ordered in Other Visits  Medication Dose Route Frequency Provider Last Rate Last Admin   cyanocobalamin ((VITAMIN B-12)) injection 1,000 mcg  1,000 mcg Intramuscular Once Derek Jack, MD       cyanocobalamin ((VITAMIN B-12)) injection 1,000 mcg  1,000 mcg Intramuscular Once Derek Jack, MD        ALLERGIES:  Allergies  Allergen Reactions   Acetaminophen Hives and Itching   Aspirin Hives and Itching   Ibuprofen Hives and Itching   Penicillins     Turned red and felt "pins and needles" in his feet    PHYSICAL EXAM:  Performance status (ECOG): 0 - Asymptomatic  There were no vitals filed for this visit. Wt Readings from Last 3 Encounters:  12/22/20 196 lb 4.8 oz (89 kg)  09/23/20 197 lb 1.6 oz (89.4 kg)  08/29/20 192 lb (87.1 kg)   Physical Exam Vitals reviewed.  Constitutional:      Appearance: Normal appearance.  Cardiovascular:     Rate and Rhythm: Normal rate and regular rhythm.     Pulses: Normal pulses.     Heart sounds: Normal heart sounds.  Pulmonary:     Effort: Pulmonary effort is normal.     Breath sounds: Normal breath sounds.  Neurological:     General: No focal deficit present.     Mental Status: He is alert and oriented to person, place, and time.  Psychiatric:        Mood and Affect: Mood normal.        Behavior: Behavior normal.    LABORATORY DATA:  I have reviewed the labs as listed.  CBC Latest Ref Rng & Units 04/17/2021 12/20/2020 09/30/2020  WBC 4.0 - 10.5 K/uL 3.0(L) 3.4(L) 3.4(L)  Hemoglobin 13.0 - 17.0 g/dL 11.0(L) 11.0(L) 11.8(L)  Hematocrit 39.0 - 52.0 % 33.6(L) 33.5(L) 35.4(L)  Platelets 150 - 400 K/uL 93(L) 124(L) Not Measured   CMP Latest Ref Rng & Units 12/20/2020 09/30/2020 08/25/2020  Glucose 70 - 99 mg/dL 100(H) 89 97  BUN 6 - 20 mg/dL _0 Creatinine 0.61 - 1.24 mg/dL 0.98 1.13 1.21  Sodium 135 - 145 mmol/L 137 137 140  Potassium 3.5 - 5.1 mmol/L 3.8 4.5 3.8  Chloride 98 -  111 mmol/L 105 101 105  CO2 22 - 32 mmol/L _1 Calcium 8.9 - 10.3 mg/dL 8.9 9.0 8.7(L)  Total Protein 6.5 - 8.1 g/dL 7.7 7.5 7.0  Total Bilirubin 0.3 - 1.2 mg/dL 0.6 0.6 0.6  Alkaline Phos 38 - 126 U/L 41 41 40  AST 15 -  41 U/L _0 ALT 0 - 44 U/L _1 Component Value Date/Time   RBC 3.22 (L) 04/17/2021 1114   MCV 104.3 (H) 04/17/2021 1114   MCH 34.2 (H) 04/17/2021 1114   MCHC 32.7 04/17/2021 1114   RDW 14.9 04/17/2021 1114   LYMPHSABS 1.8 04/17/2021 1114   MONOABS 0.0 (L) 04/17/2021 1114   EOSABS 0.1 04/17/2021 1114   BASOSABS 0.0 04/17/2021 1114    DIAGNOSTIC IMAGING:  I have independently reviewed the scans and discussed with the patient. No results found.   ASSESSMENT:  1.  Pancytopenia: -CBC on 04/23/2018 shows white count of 2.4, platelet count of 88 with an MCV of 107. -This was repeated on 05/01/2018 when the white count was 2.9, platelet count of 107 with a normal differential. -CTAP from 08/13/2018 showed fatty infiltration of the liver with mild splenomegaly.   2.  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   PLAN:  1.  Pancytopenia: - Pancytopenia from combination of B12 deficiency and mild splenomegaly. - Reviewed labs from 04/17/2021.  White count is stable around 3.0 and ANC of 1.1.  He does not report any recurrent infections.  Platelet count is around his baseline of 93. - He underwent MRI of the lumbar spine by Dr. Rolena Infante around 02/17/2021.  Apparently it showed some bone marrow abnormality. - I have requested Dr. Charmayne Sheer office to fax [place] the MRI report. - We have reviewed SPEP from 04/17/2021 which was negative.  Serum immunofixation was normal.  Kappa light chains were elevated at 46.4 and lambda light chains 21.3 and ratio of 2.18, slightly above the normal of 1.65. - I will review the MRI report and see if there is any need for bone marrow biopsy. - He is still planning to have spinal surgery done soon.    2.  Macrocytosis: - MCV is 104.3 with hemoglobin of 11.0.  B12 is 260.  Orders placed this encounter:  No orders of the defined types were placed in this encounter.    Derek Jack, MD [place] [phone]   I, Thana Ates, am acting as a scribe for Dr. Derek Jack.  I, Derek Jack MD, have reviewed the above documentation for accuracy and completeness, and I agree with the above.

## 2021-04-21 LAB — IMMUNOFIXATION ELECTROPHORESIS
IgA: 147 mg/dL (ref 90–386)
IgG (Immunoglobin G), Serum: 1271 mg/dL (ref 603–1613)
IgM (Immunoglobulin M), Srm: 96 mg/dL (ref 20–172)
Total Protein ELP: 6.9 g/dL (ref 6.0–8.5)

## 2021-04-24 ENCOUNTER — Inpatient Hospital Stay (HOSPITAL_BASED_OUTPATIENT_CLINIC_OR_DEPARTMENT_OTHER): Payer: 59 | Admitting: Hematology

## 2021-04-24 ENCOUNTER — Other Ambulatory Visit: Payer: Self-pay

## 2021-04-24 VITALS — BP 135/96 | HR 92 | Temp 97.7°F | Resp 18 | Wt 190.0 lb

## 2021-04-24 DIAGNOSIS — E538 Deficiency of other specified B group vitamins: Secondary | ICD-10-CM | POA: Diagnosis not present

## 2021-04-24 DIAGNOSIS — D61818 Other pancytopenia: Secondary | ICD-10-CM | POA: Diagnosis not present

## 2021-04-24 NOTE — Patient Instructions (Signed)
[facility] °Discharge Instructions ° °You were seen today by Dr. Katragadda. He went over your recent results. Dr. Katragadda will see you back in 4 months for labs and follow up. ° ° °Thank you for choosing [facility] at [facility] to provide your oncology and hematology care.  To afford each patient quality time with our provider, please arrive at least 15 minutes before your scheduled appointment time.  ° °If you have a lab appointment with the Cancer Center please come in thru the Main Entrance and check in at the main information desk ° °You need to re-schedule your appointment should you arrive 10 or more minutes late.  We strive to give you quality time with our providers, and arriving late affects you and other patients whose appointments are after yours.  Also, if you no show three or more times for appointments you may be dismissed from the clinic at the providers discretion.     °Again, thank you for choosing [facility].  Our hope is that these requests will decrease the amount of time that you wait before being seen by our physicians.       °_____________________________________________________________ ° °Should you have questions after your visit to [facility], please contact our office at [phone] between the hours of 8:00 a.m. and 4:30 p.m.  Voicemails left after 4:00 p.m. will not be returned until the following business day.  For prescription refill requests, have your pharmacy contact our office and allow 72 hours.   ° °Cancer Center Support Programs:  ° °> Cancer Support Group  °2nd Tuesday of the month 1pm-2pm, Journey Room  ° ° °

## 2021-05-03 ENCOUNTER — Other Ambulatory Visit: Payer: Self-pay | Admitting: Neurological Surgery

## 2021-05-03 DIAGNOSIS — M4310 Spondylolisthesis, site unspecified: Secondary | ICD-10-CM

## 2021-05-19 ENCOUNTER — Inpatient Hospital Stay (HOSPITAL_COMMUNITY): Payer: 59 | Attending: Hematology

## 2021-05-19 ENCOUNTER — Other Ambulatory Visit: Payer: Self-pay

## 2021-05-19 ENCOUNTER — Encounter (HOSPITAL_COMMUNITY): Payer: Self-pay

## 2021-05-19 VITALS — BP 150/96 | HR 66 | Temp 97.0°F | Resp 18

## 2021-05-19 DIAGNOSIS — E538 Deficiency of other specified B group vitamins: Secondary | ICD-10-CM | POA: Diagnosis present

## 2021-05-19 DIAGNOSIS — D61818 Other pancytopenia: Secondary | ICD-10-CM

## 2021-05-19 MED ORDER — CYANOCOBALAMIN 1000 MCG/ML IJ SOLN
INTRAMUSCULAR | Status: AC
  Filled 2021-05-19: qty 1

## 2021-05-19 MED ORDER — CYANOCOBALAMIN 1000 MCG/ML IJ SOLN
1000.0000 ug | Freq: Once | INTRAMUSCULAR | Status: AC
  Administered 2021-05-19: 1000 ug via INTRAMUSCULAR

## 2021-05-19 NOTE — Patient Instructions (Signed)
[facility]  Discharge Instructions: °Thank you for choosing [facility] to provide your oncology and hematology care.  °If you have a lab appointment with the Cancer Center, please come in thru the Main Entrance and check in at the main information desk. ° °Wear comfortable clothing and clothing appropriate for easy access to any Portacath or PICC line.  ° °We strive to give you quality time with your provider. You may need to reschedule your appointment if you arrive late (15 or more minutes).  Arriving late affects you and other patients whose appointments are after yours.  Also, if you miss three or more appointments without notifying the office, you may be dismissed from the clinic at the provider’s discretion.    °  °For prescription refill requests, have your pharmacy contact our office and allow 72 hours for refills to be completed.   ° °Today you received the following B12 injection.     °  °To help prevent nausea and vomiting after your treatment, we encourage you to take your nausea medication as directed. ° °BELOW ARE SYMPTOMS THAT SHOULD BE REPORTED IMMEDIATELY: °*FEVER GREATER THAN 100.4 F (38 °C) OR HIGHER °*CHILLS OR SWEATING °*NAUSEA AND VOMITING THAT IS NOT CONTROLLED WITH YOUR NAUSEA MEDICATION °*UNUSUAL SHORTNESS OF BREATH °*UNUSUAL BRUISING OR BLEEDING °*URINARY PROBLEMS (pain or burning when urinating, or frequent urination) °*BOWEL PROBLEMS (unusual diarrhea, constipation, pain near the anus) °TENDERNESS IN MOUTH AND THROAT WITH OR WITHOUT PRESENCE OF ULCERS (sore throat, sores in mouth, or a toothache) °UNUSUAL RASH, SWELLING OR PAIN  °UNUSUAL VAGINAL DISCHARGE OR ITCHING  ° °Items with * indicate a potential emergency and should be followed up as soon as possible or go to the Emergency Department if any problems should occur. ° °Please show the CHEMOTHERAPY ALERT CARD or IMMUNOTHERAPY ALERT CARD at check-in to the Emergency Department and triage nurse. ° °Should  you have questions after your visit or need to cancel or reschedule your appointment, please contact [facility] [phone]  and follow the prompts.  Office hours are 8:00 a.m. to 4:30 p.m. Monday - Friday. Please note that voicemails left after 4:00 p.m. may not be returned until the following business day.  We are closed weekends and major holidays. You have access to a nurse at all times for urgent questions. Please call the main number to the clinic [phone] and follow the prompts. ° °For any non-urgent questions, you may also contact your provider using MyChart. We now offer e-Visits for anyone 18 and older to request care online for non-urgent symptoms. For details visit [email or web address]. °  °Also download the MyChart app! Go to the app store, search "MyChart", open the app, select [facility], and log in with your MyChart username and password. ° °Due to Covid, a mask is required upon entering the hospital/clinic. If you do not have a mask, one will be given to you upon arrival. For doctor visits, patients may have 1 support person aged 18 or older with them. For treatment visits, patients cannot have anyone with them due to current Covid guidelines and our immunocompromised population.  °

## 2021-05-19 NOTE — Progress Notes (Signed)
Logan Brady presents today for injection per the provider's orders.  B12 administration without incident; injection site WNL; see MAR for injection details.  Patient tolerated procedure well and without incident.  No questions or complaints noted at this time. Discharged ambulatory in stable condition.

## 2021-06-19 ENCOUNTER — Encounter (HOSPITAL_COMMUNITY): Payer: Self-pay

## 2021-06-19 ENCOUNTER — Inpatient Hospital Stay (HOSPITAL_COMMUNITY): Payer: 59 | Attending: Hematology

## 2021-06-19 ENCOUNTER — Other Ambulatory Visit: Payer: Self-pay

## 2021-06-19 VITALS — BP 145/99 | HR 75 | Temp 97.1°F | Resp 18

## 2021-06-19 DIAGNOSIS — D61818 Other pancytopenia: Secondary | ICD-10-CM

## 2021-06-19 DIAGNOSIS — Z79899 Other long term (current) drug therapy: Secondary | ICD-10-CM | POA: Diagnosis not present

## 2021-06-19 DIAGNOSIS — E538 Deficiency of other specified B group vitamins: Secondary | ICD-10-CM | POA: Insufficient documentation

## 2021-06-19 MED ORDER — CYANOCOBALAMIN 1000 MCG/ML IJ SOLN
1000.0000 ug | Freq: Once | INTRAMUSCULAR | Status: AC
  Administered 2021-06-19: 1000 ug via INTRAMUSCULAR
  Filled 2021-06-19: qty 1

## 2021-06-19 NOTE — Progress Notes (Signed)
Patient tolerated injection with no complaints voiced.  Site clean and dry with no bruising or swelling noted at site.  See MAR for details.  Band aid applied.  Patient stable during and after injection.  Vss with discharge and left in satisfactory condition with no s/s of distress noted.  

## 2021-06-19 NOTE — Patient Instructions (Signed)
[facility]  Discharge Instructions: Thank you for choosing [facility] to provide your oncology and hematology care.  If you have a lab appointment with the [facility], please come in thru the Main Entrance and check in at the main information desk.  Wear comfortable clothing and clothing appropriate for easy access to any Portacath or PICC line.   We strive to give you quality time with your provider. You may need to reschedule your appointment if you arrive late (15 or more minutes).  Arriving late affects you and other patients whose appointments are after yours.  Also, if you miss three or more appointments without notifying the office, you may be dismissed from the clinic at the provider's discretion.      For prescription refill requests, have your pharmacy contact our office and allow 72 hours for refills to be completed.    Today you received the following chemotherapy and/or immunotherapy agents vitamin b12 injection today.       To help prevent nausea and vomiting after your treatment, we encourage you to take your nausea medication as directed.  BELOW ARE SYMPTOMS THAT SHOULD BE REPORTED IMMEDIATELY: *FEVER GREATER THAN 100.4 F (38 C) OR HIGHER *CHILLS OR SWEATING *NAUSEA AND VOMITING THAT IS NOT CONTROLLED WITH YOUR NAUSEA MEDICATION *UNUSUAL SHORTNESS OF BREATH *UNUSUAL BRUISING OR BLEEDING *URINARY PROBLEMS (pain or burning when urinating, or frequent urination) *BOWEL PROBLEMS (unusual diarrhea, constipation, pain near the anus) TENDERNESS IN MOUTH AND THROAT WITH OR WITHOUT PRESENCE OF ULCERS (sore throat, sores in mouth, or a toothache) UNUSUAL RASH, SWELLING OR PAIN  UNUSUAL VAGINAL DISCHARGE OR ITCHING   Items with * indicate a potential emergency and should be followed up as soon as possible or go to the Emergency Department if any problems should occur.  Please show the CHEMOTHERAPY ALERT CARD or IMMUNOTHERAPY ALERT CARD at check-in to  the Emergency Department and triage nurse.  Should you have questions after your visit or need to cancel or reschedule your appointment, please contact [facility] [phone]  and follow the prompts.  Office hours are 8:00 a.m. to 4:30 p.m. Monday - Friday. Please note that voicemails left after 4:00 p.m. may not be returned until the following business day.  We are closed weekends and major holidays. You have access to a nurse at all times for urgent questions. Please call the main number to the clinic [phone] and follow the prompts.  For any non-urgent questions, you may also contact your provider using MyChart. We now offer e-Visits for anyone 56 and older to request care online for non-urgent symptoms. For details visit [email or web address].   Also download the MyChart app! Go to the app store, search "MyChart", open the app, select [facility], and log in with your MyChart username and password.  Due to Covid, a mask is required upon entering the hospital/clinic. If you do not have a mask, one will be given to you upon arrival. For doctor visits, patients may have 1 support person aged 71 or older with them. For treatment visits, patients cannot have anyone with them due to current Covid guidelines and our immunocompromised population.

## 2021-07-20 ENCOUNTER — Encounter (HOSPITAL_COMMUNITY): Payer: Self-pay

## 2021-07-20 ENCOUNTER — Inpatient Hospital Stay (HOSPITAL_COMMUNITY): Payer: 59 | Attending: Hematology

## 2021-07-20 ENCOUNTER — Other Ambulatory Visit: Payer: Self-pay

## 2021-07-20 VITALS — BP 137/82 | HR 75 | Temp 97.1°F | Resp 19

## 2021-07-20 DIAGNOSIS — D61818 Other pancytopenia: Secondary | ICD-10-CM

## 2021-07-20 DIAGNOSIS — E538 Deficiency of other specified B group vitamins: Secondary | ICD-10-CM | POA: Diagnosis present

## 2021-07-20 MED ORDER — CYANOCOBALAMIN 1000 MCG/ML IJ SOLN
1000.0000 ug | Freq: Once | INTRAMUSCULAR | Status: AC
Start: 2021-07-20 — End: 2021-07-20
  Administered 2021-07-20: 1000 ug via INTRAMUSCULAR
  Filled 2021-07-20: qty 1

## 2021-07-20 NOTE — Patient Instructions (Signed)
[facility]  Discharge Instructions: Thank you for choosing [facility] to provide your oncology and hematology care.  If you have a lab appointment with the [facility], please come in thru the Main Entrance and check in at the main information desk.  Wear comfortable clothing and clothing appropriate for easy access to any Portacath or PICC line.   We strive to give you quality time with your provider. You may need to reschedule your appointment if you arrive late (15 or more minutes).  Arriving late affects you and other patients whose appointments are after yours.  Also, if you miss three or more appointments without notifying the office, you may be dismissed from the clinic at the provider's discretion.      For prescription refill requests, have your pharmacy contact our office and allow 72 hours for refills to be completed.    Today you received the following chemotherapy and/or immunotherapy agents vitamin b12 rodsayl.       To help prevent nausea and vomiting after your treatment, we encourage you to take your nausea medication as directed.  BELOW ARE SYMPTOMS THAT SHOULD BE REPORTED IMMEDIATELY: *FEVER GREATER THAN 100.4 F (38 C) OR HIGHER *CHILLS OR SWEATING *NAUSEA AND VOMITING THAT IS NOT CONTROLLED WITH YOUR NAUSEA MEDICATION *UNUSUAL SHORTNESS OF BREATH *UNUSUAL BRUISING OR BLEEDING *URINARY PROBLEMS (pain or burning when urinating, or frequent urination) *BOWEL PROBLEMS (unusual diarrhea, constipation, pain near the anus) TENDERNESS IN MOUTH AND THROAT WITH OR WITHOUT PRESENCE OF ULCERS (sore throat, sores in mouth, or a toothache) UNUSUAL RASH, SWELLING OR PAIN  UNUSUAL VAGINAL DISCHARGE OR ITCHING   Items with * indicate a potential emergency and should be followed up as soon as possible or go to the Emergency Department if any problems should occur.  Please show the CHEMOTHERAPY ALERT CARD or IMMUNOTHERAPY ALERT CARD at check-in to the  Emergency Department and triage nurse.  Should you have questions after your visit or need to cancel or reschedule your appointment, please contact [facility] [phone]  and follow the prompts.  Office hours are 8:00 a.m. to 4:30 p.m. Monday - Friday. Please note that voicemails left after 4:00 p.m. may not be returned until the following business day.  We are closed weekends and major holidays. You have access to a nurse at all times for urgent questions. Please call the main number to the clinic [phone] and follow the prompts.  For any non-urgent questions, you may also contact your provider using MyChart. We now offer e-Visits for anyone 33 and older to request care online for non-urgent symptoms. For details visit [email or web address].   Also download the MyChart app! Go to the app store, search "MyChart", open the app, select [facility], and log in with your MyChart username and password.  Due to Covid, a mask is required upon entering the hospital/clinic. If you do not have a mask, one will be given to you upon arrival. For doctor visits, patients may have 1 support person aged 20 or older with them. For treatment visits, patients cannot have anyone with them due to current Covid guidelines and our immunocompromised population.

## 2021-07-20 NOTE — Progress Notes (Signed)
Patient tolerated injection with no complaints voiced.  Site clean and dry with no bruising or swelling noted at site.  See MAR for details.  Band aid applied.  Patient stable during and after injection.  Vss with discharge and left in satisfactory condition with no s/s of distress noted.  

## 2021-07-27 ENCOUNTER — Encounter (HOSPITAL_COMMUNITY): Payer: Self-pay | Admitting: Hematology

## 2021-07-28 NOTE — Pre-Procedure Instructions (Signed)
Surgical Instructions   Your procedure is scheduled on Wednesday, September 21st. Report to [facility] Main Entrance "A" at 06:30 A.M., then check in with the Admitting office. Call this number if you have problems the morning of surgery: [phone]   If you have any questions prior to your surgery date call [phone]: Open Monday-Friday 8am-4pm   Remember: Do not eat or drink after midnight the night before your surgery     Take these medicines the morning of surgery with A SIP OF WATER  buPROPion (WELLBUTRIN SR)  famotidine (PEPCID) gabapentin (NEURONTIN)  If needed: albuterol (VENTOLIN HFA)- bring inhaler with you on day of surgery rizatriptan (MAXALT) tiZANidine (ZANAFLEX)  traMADol (ULTRAM)   As of today, STOP taking any Aspirin (unless otherwise instructed by your surgeon) Aleve, diclofenac sodium (VOLTAREN) gel, meloxicam (MOBIC), Naproxen, Ibuprofen, Motrin, Advil, Goody's, BC's, all herbal medications, fish oil, and all vitamins.  Follow surgeon's instructions on when to stop ELIQUIS. If you have not received instructions, contact your surgeon's office.              Do NOT Smoke (Tobacco/Vaping)  24 hours prior to your procedure If you use a CPAP at night, you may bring your mask for your overnight stay.   Contacts, glasses, dentures or bridgework may not be worn into surgery, please bring cases for these belongings   For patients admitted to the hospital, discharge time will be determined by your treatment team.   Patients discharged the day of surgery will not be allowed to drive home, and someone needs to stay with them for 24 hours.  NO VISITORS WILL BE ALLOWED IN PRE-OP WHERE PATIENTS GET READY FOR SURGERY.  ONLY 1 SUPPORT PERSON MAY BE PRESENT WHILE YOU ARE IN SURGERY.  IF YOU ARE TO BE ADMITTED, ONCE YOU ARE IN YOUR ROOM YOU WILL BE ALLOWED TWO (2) VISITORS.  Minor children may have two parents present. Special consideration for safety and communication  needs will be reviewed on a case by case basis.  Special instructions:    Oral Hygiene is also important to reduce your risk of infection.  Remember - BRUSH YOUR TEETH THE MORNING OF SURGERY WITH YOUR REGULAR TOOTHPASTE   [place]- Preparing For Surgery  Before surgery, you can play an important role. Because skin is not sterile, your skin needs to be as free of germs as possible. You can reduce the number of germs on your skin by washing with CHG (chlorahexidine gluconate) Soap before surgery.  CHG is an antiseptic cleaner which kills germs and bonds with the skin to continue killing germs even after washing.     Please do not use if you have an allergy to CHG or antibacterial soaps. If your skin becomes reddened/irritated stop using the CHG.  Do not shave (including legs and underarms) for at least 48 hours prior to first CHG shower. It is OK to shave your face.  Please follow these instructions carefully.     Shower the NIGHT BEFORE SURGERY and the MORNING OF SURGERY with CHG Soap.   If you chose to wash your hair, wash your hair first as usual with your normal shampoo. After you shampoo, rinse your hair and body thoroughly to remove the shampoo.  Then [employer] and genitals (private parts) with your normal soap and rinse thoroughly to remove soap.  After that Use CHG Soap as you would any other liquid soap. You can apply CHG directly to the skin and wash gently with  a scrungie or a clean washcloth.   Apply the CHG Soap to your body ONLY FROM THE NECK DOWN.  Do not use on open wounds or open sores. Avoid contact with your eyes, ears, mouth and genitals (private parts). Wash Face and genitals (private parts)  with your normal soap.   Wash thoroughly, paying special attention to the area where your surgery will be performed.  Thoroughly rinse your body with warm water from the neck down.  DO NOT shower/wash with your normal soap after using and rinsing off the CHG Soap.  Pat  yourself dry with a CLEAN TOWEL.  Wear CLEAN PAJAMAS to bed the night before surgery  Place CLEAN SHEETS on your bed the night before your surgery  DO NOT SLEEP WITH PETS.   Day of Surgery:  Do not wear jewelry  Do not wear lotions, powders, colognes, or deodorant. Men may shave face and neck. Do not bring valuables to the hospital. [facility] is not responsible for any belongings or valuables. Take a shower with CHG soap. Wear Clean/Comfortable clothing the morning of surgery Remember to brush your teeth WITH YOUR REGULAR TOOTHPASTE.   Please read over the following fact sheets that you were given.

## 2021-07-31 ENCOUNTER — Encounter (HOSPITAL_COMMUNITY): Payer: Self-pay | Admitting: Hematology

## 2021-07-31 ENCOUNTER — Inpatient Hospital Stay (HOSPITAL_COMMUNITY)
Admission: RE | Admit: 2021-07-31 | Discharge: 2021-07-31 | Disposition: A | Discharge status: Home / Self Care | Payer: 59 | Source: Ambulatory Visit

## 2021-07-31 NOTE — Pre-Procedure Instructions (Signed)
Surgical Instructions   Your procedure is scheduled on Wednesday, September 21st. Report to [facility] Main Entrance "A" at 06:30 A.M., then check in with the Admitting office. Call this number if you have problems the morning of surgery: [phone]   If you have any questions prior to your surgery date call [phone]: Open Monday-Friday 8am-4pm   Remember: Do not eat or drink after midnight the night before your surgery     Take these medicines the morning of surgery with A SIP OF WATER  buPROPion (WELLBUTRIN SR)  famotidine (PEPCID) gabapentin (NEURONTIN) Sofosbuvir-Velpatasvir  If needed: albuterol (VENTOLIN HFA)- bring inhaler with you on day of surgery rizatriptan (MAXALT) tiZANidine (ZANAFLEX)  traMADol (ULTRAM)    >>Follow surgeon's instructions on when to stop ELIQUIS. If you have not received instructions, contact your surgeon's office.   As of today, STOP taking any Aspirin (unless otherwise instructed by your surgeon) Aleve, aspirin-acetaminophen-caffeine (EXCEDRIN MIGRAINE), diclofenac sodium (VOLTAREN) gel, meloxicam (MOBIC), Naproxen, Ibuprofen, Motrin, Advil, Goody's, BC's, all herbal medications, fish oil, and all vitamins.              Do NOT Smoke (Tobacco/Vaping) or drink alcohol 24 hours prior to your procedure. If you use a CPAP at night, you may bring your mask for your overnight stay.   Contacts, glasses, dentures or bridgework may not be worn into surgery, please bring cases for these belongings   For patients admitted to the hospital, discharge time will be determined by your treatment team.   Patients discharged the day of surgery will not be allowed to drive home, and someone needs to stay with them for 24 hours.  NO VISITORS WILL BE ALLOWED IN PRE-OP WHERE PATIENTS GET READY FOR SURGERY.  ONLY 1 SUPPORT PERSON MAY BE PRESENT WHILE YOU ARE IN SURGERY.  IF YOU ARE TO BE ADMITTED, ONCE YOU ARE IN YOUR ROOM YOU WILL BE ALLOWED TWO (2) VISITORS.   Minor children may have two parents present. Special consideration for safety and communication needs will be reviewed on a case by case basis.  Special instructions:    Oral Hygiene is also important to reduce your risk of infection.  Remember - BRUSH YOUR TEETH THE MORNING OF SURGERY WITH YOUR REGULAR TOOTHPASTE   [place]- Preparing For Surgery  Before surgery, you can play an important role. Because skin is not sterile, your skin needs to be as free of germs as possible. You can reduce the number of germs on your skin by washing with CHG (chlorahexidine gluconate) Soap before surgery.  CHG is an antiseptic cleaner which kills germs and bonds with the skin to continue killing germs even after washing.     Please do not use if you have an allergy to CHG or antibacterial soaps. If your skin becomes reddened/irritated stop using the CHG.  Do not shave (including legs and underarms) for at least 48 hours prior to first CHG shower. It is OK to shave your face.  Please follow these instructions carefully.     Shower the NIGHT BEFORE SURGERY and the MORNING OF SURGERY with CHG Soap.   If you chose to wash your hair, wash your hair first as usual with your normal shampoo. After you shampoo, rinse your hair and body thoroughly to remove the shampoo.  Then [employer] and genitals (private parts) with your normal soap and rinse thoroughly to remove soap.  After that Use CHG Soap as you would any other liquid soap. You can apply CHG  directly to the skin and wash gently with a scrungie or a clean washcloth.   Apply the CHG Soap to your body ONLY FROM THE NECK DOWN.  Do not use on open wounds or open sores. Avoid contact with your eyes, ears, mouth and genitals (private parts). Wash Face and genitals (private parts)  with your normal soap.   Wash thoroughly, paying special attention to the area where your surgery will be performed.  Thoroughly rinse your body with warm water from the neck  down.  DO NOT shower/wash with your normal soap after using and rinsing off the CHG Soap.  Pat yourself dry with a CLEAN TOWEL.  Wear CLEAN PAJAMAS to bed the night before surgery  Place CLEAN SHEETS on your bed the night before your surgery  DO NOT SLEEP WITH PETS.   Day of Surgery:  Do not wear jewelry  Do not wear lotions, powders, colognes, or deodorant. Men may shave face and neck. Do not bring valuables to the hospital. [facility] is not responsible for any belongings or valuables. Take a shower with CHG soap. Wear Clean/Comfortable clothing the morning of surgery Remember to brush your teeth WITH YOUR REGULAR TOOTHPASTE.   Please read over the following fact sheets that you were given.

## 2021-08-01 ENCOUNTER — Inpatient Hospital Stay (HOSPITAL_COMMUNITY): Payer: 59

## 2021-08-01 ENCOUNTER — Encounter (HOSPITAL_COMMUNITY): Payer: Self-pay | Admitting: Physician Assistant

## 2021-08-01 ENCOUNTER — Other Ambulatory Visit: Payer: Self-pay

## 2021-08-01 ENCOUNTER — Encounter (HOSPITAL_COMMUNITY): Payer: Self-pay | Admitting: Certified Registered Nurse Anesthetist

## 2021-08-01 ENCOUNTER — Encounter (HOSPITAL_COMMUNITY)
Admission: RE | Admit: 2021-08-01 | Discharge: 2021-08-01 | Disposition: A | Discharge status: Home / Self Care | Payer: 59 | Source: Ambulatory Visit | Attending: Neurological Surgery | Admitting: Neurological Surgery

## 2021-08-01 ENCOUNTER — Encounter (HOSPITAL_COMMUNITY): Payer: Self-pay

## 2021-08-01 DIAGNOSIS — M4310 Spondylolisthesis, site unspecified: Secondary | ICD-10-CM | POA: Diagnosis not present

## 2021-08-01 DIAGNOSIS — D61818 Other pancytopenia: Secondary | ICD-10-CM | POA: Diagnosis not present

## 2021-08-01 DIAGNOSIS — Z8619 Personal history of other infectious and parasitic diseases: Secondary | ICD-10-CM | POA: Insufficient documentation

## 2021-08-01 DIAGNOSIS — Z20822 Contact with and (suspected) exposure to covid-19: Secondary | ICD-10-CM | POA: Insufficient documentation

## 2021-08-01 DIAGNOSIS — Z01818 Encounter for other preprocedural examination: Secondary | ICD-10-CM | POA: Insufficient documentation

## 2021-08-01 HISTORY — DX: Cardiac murmur, unspecified: R01.1

## 2021-08-01 HISTORY — DX: Depression, unspecified: F32.A

## 2021-08-01 LAB — CBC WITH DIFFERENTIAL/PLATELET
Abs Immature Granulocytes: 0.01 10*3/uL (ref 0.00–0.07)
Abs Immature Granulocytes: 0.01 10*3/uL (ref 0.00–0.07)
Basophils Absolute: 0 10*3/uL (ref 0.0–0.1)
Basophils Absolute: 0 10*3/uL (ref 0.0–0.1)
Basophils Relative: 1 %
Basophils Relative: 0 %
Eosinophils Absolute: 0.1 10*3/uL (ref 0.0–0.5)
Eosinophils Absolute: 0.1 10*3/uL (ref 0.0–0.5)
Eosinophils Relative: 3 %
Eosinophils Relative: 2 %
HCT: 32.6 % — ABNORMAL LOW (ref 39.0–52.0)
HCT: 30.4 % — ABNORMAL LOW (ref 39.0–52.0)
Hemoglobin: 10.8 g/dL — ABNORMAL LOW (ref 13.0–17.0)
Hemoglobin: 10.2 g/dL — ABNORMAL LOW (ref 13.0–17.0)
Immature Granulocytes: 0 %
Immature Granulocytes: 0 %
Lymphocytes Relative: 61 %
Lymphocytes Relative: 63 %
Lymphs Abs: 1.7 10*3/uL (ref 0.7–4.0)
Lymphs Abs: 1.6 10*3/uL (ref 0.7–4.0)
MCH: 34.8 pg — ABNORMAL HIGH (ref 26.0–34.0)
MCH: 35.3 pg — ABNORMAL HIGH (ref 26.0–34.0)
MCHC: 33.1 g/dL (ref 30.0–36.0)
MCHC: 33.6 g/dL (ref 30.0–36.0)
MCV: 105.2 fL — ABNORMAL HIGH (ref 80.0–100.0)
MCV: 105.2 fL — ABNORMAL HIGH (ref 80.0–100.0)
Monocytes Absolute: 0 10*3/uL — ABNORMAL LOW (ref 0.1–1.0)
Monocytes Absolute: 0 10*3/uL — ABNORMAL LOW (ref 0.1–1.0)
Monocytes Relative: 1 %
Monocytes Relative: 1 %
Neutro Abs: 1 10*3/uL — ABNORMAL LOW (ref 1.7–7.7)
Neutro Abs: 0.9 10*3/uL — ABNORMAL LOW (ref 1.7–7.7)
Neutrophils Relative %: 34 %
Neutrophils Relative %: 34 %
Platelets: 105 10*3/uL — ABNORMAL LOW (ref 150–400)
Platelets: 105 10*3/uL — ABNORMAL LOW (ref 150–400)
RBC: 3.1 MIL/uL — ABNORMAL LOW (ref 4.22–5.81)
RBC: 2.89 MIL/uL — ABNORMAL LOW (ref 4.22–5.81)
RDW: 15.8 % — ABNORMAL HIGH (ref 11.5–15.5)
RDW: 15.9 % — ABNORMAL HIGH (ref 11.5–15.5)
WBC: 2.8 10*3/uL — ABNORMAL LOW (ref 4.0–10.5)
WBC: 2.6 10*3/uL — ABNORMAL LOW (ref 4.0–10.5)
nRBC: 0 % (ref 0.0–0.2)
nRBC: 0 % (ref 0.0–0.2)

## 2021-08-01 LAB — COMPREHENSIVE METABOLIC PANEL
ALT: 12 U/L (ref 0–44)
ALT: 12 U/L (ref 0–44)
AST: 19 U/L (ref 15–41)
AST: 17 U/L (ref 15–41)
Albumin: 4.2 g/dL (ref 3.5–5.0)
Albumin: 4.4 g/dL (ref 3.5–5.0)
Alkaline Phosphatase: 37 U/L — ABNORMAL LOW (ref 38–126)
Alkaline Phosphatase: 38 U/L (ref 38–126)
Anion gap: 9 (ref 5–15)
Anion gap: 9 (ref 5–15)
BUN: 28 mg/dL — ABNORMAL HIGH (ref 6–20)
BUN: 30 mg/dL — ABNORMAL HIGH (ref 6–20)
CO2: 24 mmol/L (ref 22–32)
CO2: 23 mmol/L (ref 22–32)
Calcium: 9.6 mg/dL (ref 8.9–10.3)
Calcium: 9 mg/dL (ref 8.9–10.3)
Chloride: 104 mmol/L (ref 98–111)
Chloride: 105 mmol/L (ref 98–111)
Creatinine, Ser: 1.35 mg/dL — ABNORMAL HIGH (ref 0.61–1.24)
Creatinine, Ser: 1.29 mg/dL — ABNORMAL HIGH (ref 0.61–1.24)
GFR, Estimated: >60 mL/min (ref >60)
GFR, Estimated: >60 mL/min (ref >60)
Glucose, Bld: 104 mg/dL — ABNORMAL HIGH (ref 70–99)
Glucose, Bld: 119 mg/dL — ABNORMAL HIGH (ref 70–99)
Potassium: 4 mmol/L (ref 3.5–5.1)
Potassium: 4.1 mmol/L (ref 3.5–5.1)
Sodium: 137 mmol/L (ref 135–145)
Sodium: 137 mmol/L (ref 135–145)
Total Bilirubin: 0.6 mg/dL (ref 0.3–1.2)
Total Bilirubin: 0.6 mg/dL (ref 0.3–1.2)
Total Protein: 7.4 g/dL (ref 6.5–8.1)
Total Protein: 7.5 g/dL (ref 6.5–8.1)

## 2021-08-01 LAB — TYPE AND SCREEN
ABO/RH(D): A POS
Antibody Screen: NEGATIVE

## 2021-08-01 LAB — HEMOGLOBIN A1C
Hgb A1c MFr Bld: 5.4 % (ref 4.8–5.6)
Mean Plasma Glucose: 108.28 mg/dL

## 2021-08-01 LAB — VITAMIN B12: Vitamin B-12: 478 pg/mL (ref 180–914)

## 2021-08-01 LAB — SARS CORONAVIRUS 2 (TAT 6-24 HRS): SARS Coronavirus 2: NEGATIVE

## 2021-08-01 LAB — PROTIME-INR
INR: 1.2 (ref 0.8–1.2)
Prothrombin Time: 15.2 seconds (ref 11.4–15.2)

## 2021-08-01 LAB — GLUCOSE, CAPILLARY: Glucose-Capillary: 122 mg/dL — ABNORMAL HIGH (ref 70–99)

## 2021-08-01 LAB — SURGICAL PCR SCREEN
MRSA, PCR: NEGATIVE
Staphylococcus aureus: NEGATIVE

## 2021-08-01 NOTE — Progress Notes (Signed)
Surgical Instructions     Your procedure is scheduled on Wednesday, September 21st. Report to [facility] Main Entrance "A" at 06:30 A.M., then check in with the Admitting office. Call this number if you have problems the morning of surgery: [phone]    If you have any questions prior to your surgery date call [phone]: Open Monday-Friday 8am-4pm     Remember: Do not eat or drink after midnight the night before your surgery                  Take these medicines the morning of surgery with A SIP OF WATER    If needed: albuterol (VENTOLIN HFA)- bring inhaler with you on day of surgery rizatriptan (MAXALT) tiZANidine (ZANAFLEX)  traMADol (ULTRAM)  neomycin-bacitracin-polymyxin (NEOSPORIN) ophthalmic ointment   >>Follow surgeon's instructions on when to stop ELIQUIS. If you have not received instructions, contact your surgeon's office.     As of today, STOP taking any Aspirin (unless otherwise instructed by your surgeon) Aleve, aspirin-acetaminophen-caffeine (EXCEDRIN MIGRAINE), diclofenac sodium (VOLTAREN) gel, meloxicam (MOBIC), Naproxen, Ibuprofen, Motrin, Advil, Goody's, BC's, all herbal medications, fish oil, and all vitamins.                Do NOT Smoke (Tobacco/Vaping) or drink alcohol 24 hours prior to your procedure. If you use a CPAP at night, you may bring your mask for your overnight stay.   Contacts, glasses, dentures or bridgework may not be worn into surgery, please bring cases for these belongings   For patients admitted to the hospital, discharge time will be determined by your treatment team.   Patients discharged the day of surgery will not be allowed to drive home, and someone needs to stay with them for 24 hours.   NO VISITORS WILL BE ALLOWED IN PRE-OP WHERE PATIENTS GET READY FOR SURGERY.  ONLY 1 SUPPORT PERSON MAY BE PRESENT WHILE YOU ARE IN SURGERY.  IF YOU ARE TO BE ADMITTED, ONCE YOU ARE IN YOUR ROOM YOU WILL BE ALLOWED TWO (2) VISITORS.  Minor  children may have two parents present. Special consideration for safety and communication needs will be reviewed on a case by case basis.   Special instructions:     Oral Hygiene is also important to reduce your risk of infection.  Remember - BRUSH YOUR TEETH THE MORNING OF SURGERY WITH YOUR REGULAR TOOTHPASTE     [place]- Preparing For Surgery   Before surgery, you can play an important role. Because skin is not sterile, your skin needs to be as free of germs as possible. You can reduce the number of germs on your skin by washing with CHG (chlorahexidine gluconate) Soap before surgery.  CHG is an antiseptic cleaner which kills germs and bonds with the skin to continue killing germs even after washing.       Please do not use if you have an allergy to CHG or antibacterial soaps. If your skin becomes reddened/irritated stop using the CHG.  Do not shave (including legs and underarms) for at least 48 hours prior to first CHG shower. It is OK to shave your face.   Please follow these instructions carefully.  Shower the NIGHT BEFORE SURGERY and the MORNING OF SURGERY with CHG Soap.  If you chose to wash your hair, wash your hair first as usual with your normal shampoo. After you shampoo, rinse your hair and body thoroughly to remove the shampoo.  Then [employer] and genitals (private parts) with your normal soap and rinse thoroughly to remove soap.   After that Use CHG Soap as you would any other liquid soap. You can apply CHG directly to the skin and wash gently with a scrungie or a clean washcloth.    Apply the CHG Soap to your body ONLY FROM THE NECK DOWN.  Do not use on open wounds or open sores. Avoid contact with your eyes, ears, mouth and genitals (private parts). Wash Face and genitals (private parts)  with your normal soap.    Wash thoroughly, paying special  attention to the area where your surgery will be performed.   Thoroughly rinse your body with warm water from the neck down.   DO NOT shower/wash with your normal soap after using and rinsing off the CHG Soap.   Pat yourself dry with a CLEAN TOWEL.   Wear CLEAN PAJAMAS to bed the night before surgery   Place CLEAN SHEETS on your bed the night before your surgery   DO NOT SLEEP WITH PETS.     Day of Surgery:   Do not wear jewelry  Do not wear lotions, powders, colognes, or deodorant. Men may shave face and neck. Do not bring valuables to the hospital. [facility] is not responsible for any belongings or valuables. Take a shower with CHG soap. Wear Clean/Comfortable clothing the morning of surgery Remember to brush your teeth WITH YOUR REGULAR TOOTHPASTE.   Please read over the following fact sheets that you were given.

## 2021-08-01 NOTE — Progress Notes (Signed)
Anesthesia Chart Review:  Follows with hematologist Dr. Delton Coombes for history of pancytopenia felt secondary to combination of B12 deficiency and mild splenomegaly.  Last seen 04/24/2021, stable at that time, discussed that he was planning to have spinal surgery in the near future.  In a previous note on 12/22/2020, Dr. Delton Coombes commented, "he is at acceptable risk for his spinal surgery by Dr. Rolena Infante."  History of Hepatitis C.  Treated with Epclusa in 2020.  Preop labs reviewed, stable mild pancytopenia.  EKG 08/01/2021: Normal sinus rhythm.  Rate 65. Left axis deviation. Incomplete right bundle branch block   Wynonia Musty [facility]/Anesthesiology Phone [phone] 08/01/2021 11:12 AM

## 2021-08-01 NOTE — Anesthesia Preprocedure Evaluation (Deleted)
Anesthesia Evaluation    Airway        Dental   Pulmonary Current Smoker,           Cardiovascular hypertension,      Neuro/Psych    GI/Hepatic   Endo/Other    Renal/GU      Musculoskeletal   Abdominal   Peds  Hematology   Anesthesia Other Findings   Reproductive/Obstetrics                             Anesthesia Physical Anesthesia Plan  ASA:   Anesthesia Plan:    Post-op Pain Management:    Induction:   PONV Risk Score and Plan:   Airway Management Planned:   Additional Equipment:   Intra-op Plan:   Post-operative Plan:   Informed Consent:   Plan Discussed with:   Anesthesia Plan Comments: (PAT note by Karoline Caldwell, PA-C: Follows with hematologist Dr. Delton Coombes for history of pancytopenia felt secondary to combination of B12 deficiency and mild splenomegaly.  Last seen 04/24/2021, stable at that time, discussed that he was planning to have spinal surgery in the near future.  In a previous note on 12/22/2020, Dr. Delton Coombes commented, "he is at acceptable risk for his spinal surgery by Dr. Rolena Infante."  History of Hepatitis C.  Treated with Epclusa in 2020.  Preop labs reviewed, stable mild pancytopenia.  EKG 08/01/2021: Normal sinus rhythm.  Rate 65. Left axis deviation. Incomplete right bundle branch block )        Anesthesia Quick Evaluation

## 2021-08-01 NOTE — Progress Notes (Signed)
PCP - Dr. Allyn Kenner Cardiologist - Denies  Chest x-ray - 08/01/21 EKG - 08/01/21 Stress Test - Denies ECHO - Denies Cardiac Cath - Denies  Sleep Study - Denies  Borderline DM  COVID TEST- 08/01/21   Anesthesia review: Yes PCP asking patient to see hematologist before moving forward with his surgery  Patient denies shortness of breath, fever, cough and chest pain at PAT appointment   All instructions explained to the patient, with a verbal understanding of the material. Patient agrees to go over the instructions while at home for a better understanding. Patient also instructed to wear a mask while in public after being tested for COVID-19. The opportunity to ask questions was provided.

## 2021-08-02 ENCOUNTER — Inpatient Hospital Stay (HOSPITAL_COMMUNITY)
Admission: RE | Admit: 2021-08-02 | Payer: No Typology Code available for payment source | Source: Home / Self Care | Admitting: Neurological Surgery

## 2021-08-02 ENCOUNTER — Encounter (HOSPITAL_COMMUNITY): Admission: RE | Payer: Self-pay | Source: Home / Self Care

## 2021-08-02 LAB — KAPPA/LAMBDA LIGHT CHAINS
Kappa free light chain: 47.7 mg/L — ABNORMAL HIGH (ref 3.3–19.4)
Kappa, lambda light chain ratio: 2.62 — ABNORMAL HIGH (ref 0.26–1.65)
Lambda free light chains: 18.2 mg/L (ref 5.7–26.3)

## 2021-08-02 SURGERY — POSTERIOR LUMBAR FUSION 3 LEVEL
Anesthesia: General | Site: Back

## 2021-08-03 LAB — PROTEIN ELECTROPHORESIS, SERUM
A/G Ratio: 1.4 (ref 0.7–1.7)
Albumin ELP: 4.1 g/dL (ref 2.9–4.4)
Alpha-1-Globulin: 0.2 g/dL (ref 0.0–0.4)
Alpha-2-Globulin: 0.8 g/dL (ref 0.4–1.0)
Beta Globulin: 0.8 g/dL (ref 0.7–1.3)
Gamma Globulin: 1.2 g/dL (ref 0.4–1.8)
Globulin, Total: 3 g/dL (ref 2.2–3.9)
Total Protein ELP: 7.1 g/dL (ref 6.0–8.5)

## 2021-08-05 NOTE — Progress Notes (Signed)
 [facility] [address] [place], [place] [address]   CLINIC:  Medical Oncology/Hematology  PCP:  Logan Brady, Logan Z, MD [address] [place] [address] [phone]   REASON FOR VISIT:  Follow-up for pancytopenia  PRIOR THERAPY: None  CURRENT THERAPY: B12 injection  INTERVAL HISTORY:  Logan Brady 59 y.o. male returns for routine follow-up of pancytopenia.  At today's visit, he reports feeling fair.  No recent hospitalizations, surgeries, or changes in baseline health status.  Patient has not yet had his planned lumbar spinal fusion surgery.  He reports that there were concerns regarding low bone marrow on recent spinal MRI, as well as other medical concerns being worked up by other specialists.  He reports that he continues to have erythema and pustules on his nose, which is somewhat improved with using peroxide and acne washes.  He denies any other infections.  No B symptoms such as unexplained fever/chills, night sweats, unintentional weight loss.  He admits to mild fatigue, but denies chest pain, dyspnea on exertion, dizziness, and syncope.  He admits to easy bruising but denies petechial rash.  He reports that he occasionally sees some streaks of blood when he blows his nose but he denies gross epistaxis.  No other source of blood loss such as hematemesis, hematochezia, or melena.  He has 75% energy and 80% appetite. He endorses that he is maintaining a stable weight.   REVIEW OF SYSTEMS:  Review of Systems  Constitutional:  Positive for fatigue (mild). Negative for appetite change, chills, diaphoresis, fever and unexpected weight change.  HENT:   Negative for lump/mass and nosebleeds.   Eyes:  Negative for eye problems.  Respiratory:  Negative for cough, hemoptysis and shortness of breath.   Cardiovascular:  Negative for chest pain, leg swelling and palpitations.  Gastrointestinal:  Negative for abdominal pain, blood in stool, constipation, diarrhea,  nausea and vomiting.  Genitourinary:  Negative for hematuria.        Reports dark, foamy urine x 2-3 weeks  Musculoskeletal:  Positive for back pain.  Skin: Negative.   Neurological:  Negative for dizziness, headaches and light-headedness.  Hematological:  Does not bruise/bleed easily.  Psychiatric/Behavioral:  Positive for depression and sleep disturbance. The patient is nervous/anxious.      PAST MEDICAL/SURGICAL HISTORY:  Past Medical History:  Diagnosis Date   Degeneration of lumbar intervertebral disc    Depression    Heart murmur    Hepatitis C    Hepatitis C 01/27/2019   Hypertension    Lumbar radiculopathy    Spinal stenosis of lumbar region    Spondylolysis    CHRONIC BILATERAL.  GRADE L5-S1.   Tobacco abuse    No past surgical history on file.   SOCIAL HISTORY:  Social History   Socioeconomic History   Marital status: Divorced    Spouse name: Not on file   Number of children: 4   Years of education: 12   Highest education level: High school graduate  Occupational History   Occupation: maintenance  Tobacco Use   Smoking status: Every Day    Packs/day: 1.00    Years: 40.00    Pack years: 40.00    Types: Cigarettes   Smokeless tobacco: Never  Vaping Use   Vaping Use: Never used  Substance and Sexual Activity   Alcohol use: Not Currently   Drug use: Not Currently    Comment: quit 30 years ago   Sexual activity: Yes  Other Topics Concern     Not on file  Social History Narrative   Not on file   Social Determinants of Health   Financial Resource Strain: Not on file  Food Insecurity: Not on file  Transportation Needs: Not on file  Physical Activity: Not on file  Stress: Not on file  Social Connections: Not on file  Intimate Partner Violence: Not on file    FAMILY HISTORY:  Family History  Problem Relation Age of Onset   Heart attack Mother    Lung cancer Father     CURRENT MEDICATIONS:  Outpatient Encounter Medications as of 08/07/2021   Medication Sig   albuterol (VENTOLIN HFA) 108 (90 Base) MCG/ACT inhaler Inhale 1-2 puffs into the lungs every 4 (four) hours as needed for wheezing or shortness of breath.   Cyanocobalamin (B-12 COMPLIANCE INJECTION) 1000 MCG/ML KIT Inject as directed every 30 (thirty) days.   hydrocortisone cream 1 % Apply 1 application topically 4 (four) times daily as needed for itching.   neomycin-bacitracin-polymyxin (NEOSPORIN) ophthalmic ointment Place 1 application into both eyes 4 (four) times daily as needed (styes).   rizatriptan (MAXALT) 5 MG tablet Take 5 mg by mouth daily as needed for migraine.   sildenafil (VIAGRA) 100 MG tablet Take 100 mg by mouth daily as needed for erectile dysfunction.   tiZANidine (ZANAFLEX) 4 MG tablet Take 4 mg by mouth every 6 (six) hours as needed for muscle spasms.    traMADol (ULTRAM) 50 MG tablet Take 50 mg by mouth every 6 (six) hours as needed for moderate pain.   Facility-Administered Encounter Medications as of 08/07/2021  Medication   cyanocobalamin ((VITAMIN B-12)) injection 1,000 mcg   cyanocobalamin ((VITAMIN B-12)) injection 1,000 mcg    ALLERGIES:  Allergies  Allergen Reactions   Ibuprofen Hives and Itching   Acetaminophen Hives and Itching   Aspirin Hives and Itching   Penicillins     Turned red and felt "pins and needles" in his feet     PHYSICAL EXAM:  ECOG PERFORMANCE STATUS: 1 - Symptomatic but completely ambulatory  There were no vitals filed for this visit. There were no vitals filed for this visit. Physical Exam Constitutional:      Appearance: Normal appearance.  HENT:     Head: Normocephalic and atraumatic.     Mouth/Throat:     Mouth: Mucous membranes are moist.  Eyes:     Extraocular Movements: Extraocular movements intact.     Pupils: Pupils are equal, round, and reactive to light.  Cardiovascular:     Rate and Rhythm: Normal rate and regular rhythm.     Pulses: Normal pulses.     Heart sounds: Normal heart sounds.   Pulmonary:     Effort: Pulmonary effort is normal.     Breath sounds: Normal breath sounds.  Abdominal:     General: Bowel sounds are normal.     Palpations: Abdomen is soft.     Tenderness: There is no abdominal tenderness.  Musculoskeletal:        General: No swelling.     Right lower leg: No edema.     Left lower leg: No edema.  Lymphadenopathy:     Cervical: No cervical adenopathy.  Skin:    General: Skin is warm and dry.  Neurological:     General: No focal deficit present.     Mental Status: He is alert and oriented to person, place, and time.  Psychiatric:        Mood and Affect: Mood normal.          Behavior: Behavior normal.     LABORATORY DATA:  I have reviewed the labs as listed.  CBC    Component Value Date/Time   WBC 2.6 (L) 08/01/2021 1252   RBC 2.89 (L) 08/01/2021 1252   HGB 10.2 (L) 08/01/2021 1252   HCT 30.4 (L) 08/01/2021 1252   PLT 105 (L) 08/01/2021 1252   MCV 105.2 (H) 08/01/2021 1252   MCH 35.3 (H) 08/01/2021 1252   MCHC 33.6 08/01/2021 1252   RDW 15.9 (H) 08/01/2021 1252   LYMPHSABS 1.6 08/01/2021 1252   MONOABS 0.0 (L) 08/01/2021 1252   EOSABS 0.1 08/01/2021 1252   BASOSABS 0.0 08/01/2021 1252   CMP Latest Ref Rng & Units 08/01/2021 08/01/2021 12/20/2020  Glucose 70 - 99 mg/dL 119(H) 104(H) 100(H)  BUN 6 - 20 mg/dL 30(H) 28(H) 14  Creatinine 0.61 - 1.24 mg/dL 1.29(H) 1.35(H) 0.98  Sodium 135 - 145 mmol/L 137 137 137  Potassium 3.5 - 5.1 mmol/L 4.1 4.0 3.8  Chloride 98 - 111 mmol/L 105 104 105  CO2 22 - 32 mmol/L 23 24 26  Calcium 8.9 - 10.3 mg/dL 9.0 9.6 8.9  Total Protein 6.5 - 8.1 g/dL 7.5 7.4 7.7  Total Bilirubin 0.3 - 1.2 mg/dL 0.6 0.6 0.6  Alkaline Phos 38 - 126 U/L 38 37(L) 41  AST 15 - 41 U/L 17 19 15  ALT 0 - 44 U/L 12 12 9    DIAGNOSTIC IMAGING:  I have independently reviewed the relevant imaging and discussed with the patient.  ASSESSMENT & PLAN: 1.  Pancytopenia with macrocytosis - Seen for initial consultation of  pancytopenia on 05/07/2018 - Pancytopenia with macrocytosis suspected to be secondary to liver disease and history of B12 deficiency - Patient found to be vitamin B12 deficient during initial work-up, has been receiving monthly B12 injections since that time - Work-up of pancytopenia found patient to be positive for hepatitis C, s/p treatment in 2020.  HIV was negative. - CT abdomen/pelvis (08/13/2018): Fatty infiltration of the liver with mild splenomegaly - Abdominal ultrasound with elastography (01/29/2019): Mild splenomegaly with liver abnormalities suggesting possible cirrhosis - Reports mild fatigue and easy bruising.  Reports ongoing acneiform skin infections of his face, but denies any other frequent infections. - Denies any significant blood loss such as gross epistaxis, hematemesis, hematochezia, or melena. - Most recent labs (08/01/2021): WBC 2.6 with ANC 0.9, Hgb 10.2 with MCV 105.2, platelets 105; CMP with slightly elevated creatinine 1.29; vitamin B12 normal at 478; SPEP normal; kappa light chain slightly elevated at 47.7, lambda light chains normal at 18.2, kappa lambda light chain ratio slightly elevated at 2.62  - Differential diagnosis favors pancytopenia and macrocytosis secondary to B12 deficiency and liver disease.  If patient has any significant deviations in his baseline counts, he may benefit from bone marrow biopsy to rule out MDS or other bone marrow disorders. - PLAN:  We will obtain a CT-guided biopsy of bone marrow due to unexplained pancytopenia as well as abnormal MRI findings (discussed below) - Check stool cards x3 to rule out occult GI blood loss as cause of anemia. - Check abdominal ultrasound to assess degree of splenomegaly - We will complete laboratory work-up of pancytopenia by checking methylmalonic acid, folate, homocystine, iron, and ferritin - RTC in 1 month to discuss results  2.  Possible cirrhosis with history of hepatitis C - Hepatitis C diagnosed during  work-up for pancytopenia - He was referred to hepatology and completed treatment x3 months - Abdominal ultrasound with   elastography (01/29/2019): Heterogeneous and slightly coarse echogenicity with irregular liver contour suggesting cirrhosis; mild splenomegaly - Patient reports that he has never been told he has cirrhosis, and that he does not currently follow with GI or hepatology - PLAN: Have sent referral to gastroenterology for work-up and management of possible cirrhosis.  3.  Abnormal MRI of lumbar spine - Planning for orthopedic surgery for anterior fusion of L3-L4 and L4-S1 as well as posterior fusion of L3/S1 due to disc extrusion from work injury - patient reports that he will need surgical clearance from hematology before proceeding, and states that he "wants to make sure everything else is okay" before going for surgery - Abnormal bone marrow noted on MRI from [facility] (03/20/2021): "Numerous T1 hypointense lesions throughout the lumbosacral spine are present with most pronounced STIR hyperintensity seen in sacral and iliac lesions.  These are suspicious for marrow replacing lesions with considerations including multiple myeloma, lymphoma, and metastatic disease." - SPEP normal; kappa light chain slightly elevated at 47.7, lambda light chains normal at 18.2, kappa lambda light chain ratio slightly elevated at 2.62  - PLAN: Discussed with Dr. Delton Coombes, and we will pursue CT-guided bone marrow biopsy of iliac lesions.  4.  Abnormal free light chain ratio - SPEP normal; kappa light chain slightly elevated at 47.7, lambda light chains normal at 18.2, kappa lambda light chain ratio slightly elevated at 2.62 - Elevated light chains may be attributable to mild CKD, but we will complete work-up as below - PLAN: We will check serum immunofixation, 24-hour urine with UPEP/IFE   5.  B12 deficiency -Most recent vitamin B12 (08/01/2021) within normal limits - PLAN: Continue monthly B12  injections.  6.  Tobacco use - Patient has about a 40-pack-year history of tobacco use - PLAN: We will discuss referral for LDCT scan at next follow-up appointment.    PLAN SUMMARY & DISPOSITION: - Labs today (methylmalonic acid, IFE, ferritin, iron, folate, homocystine) - Check stool cards x3 - Check 24-hour urine - Continue monthly B12 injections - Check abdominal ultrasound - Referral to gastroenterology for possible cirrhosis - CT-guided bone marrow biopsy - RTC in about 1 month to discuss results  All questions were answered. The patient knows to call the clinic with any problems, questions or concerns.  Medical decision making: Moderate  Time spent on visit: I spent 20 minutes counseling the patient face to face. The total time spent in the appointment was 30 minutes and more than 50% was on counseling.   Logan Rush, PA-C  08/07/2021 7:53 PM

## 2021-08-07 ENCOUNTER — Inpatient Hospital Stay (HOSPITAL_COMMUNITY): Payer: 59

## 2021-08-07 ENCOUNTER — Other Ambulatory Visit: Payer: Self-pay

## 2021-08-07 ENCOUNTER — Inpatient Hospital Stay (HOSPITAL_BASED_OUTPATIENT_CLINIC_OR_DEPARTMENT_OTHER): Payer: 59 | Admitting: Physician Assistant

## 2021-08-07 VITALS — BP 147/95 | HR 61 | Temp 97.7°F | Resp 18 | Wt 194.6 lb

## 2021-08-07 DIAGNOSIS — B1920 Unspecified viral hepatitis C without hepatic coma: Secondary | ICD-10-CM | POA: Diagnosis not present

## 2021-08-07 DIAGNOSIS — R768 Other specified abnormal immunological findings in serum: Secondary | ICD-10-CM | POA: Diagnosis not present

## 2021-08-07 DIAGNOSIS — D61818 Other pancytopenia: Secondary | ICD-10-CM

## 2021-08-07 DIAGNOSIS — E538 Deficiency of other specified B group vitamins: Secondary | ICD-10-CM | POA: Diagnosis not present

## 2021-08-07 LAB — IRON AND TIBC
Iron: 94 ug/dL (ref 45–182)
Saturation Ratios: 30 % (ref 17.9–39.5)
TIBC: 311 ug/dL (ref 250–450)
UIBC: 217 ug/dL

## 2021-08-07 LAB — FOLATE: Folate: 11.8 ng/mL (ref >5.9)

## 2021-08-07 LAB — FERRITIN: Ferritin: 56 ng/mL (ref 24–336)

## 2021-08-07 NOTE — Patient Instructions (Signed)
[place] at [facility] Discharge Instructions  You were seen today by Tarri Abernethy PA-C for your low blood counts.  We will check labs today, as well as stool cards x3 and 24-hour urine study.   We will also check an abdominal ultrasound and refer you to gastroenterology due to possible cirrhosis.  LABS: Get labs done today before leaving the hospital building  OTHER TESTS: Abdominal ultrasound, stool cards x3, 24-hour urine study  MEDICATIONS: No changes to home medication.  We will continue monthly B12 injections.  FOLLOW-UP APPOINTMENT: Office visit as previously scheduled in 1 month (08/28/2021)   Thank you for choosing [place] at [facility] to provide your oncology and hematology care.  To afford each patient quality time with our provider, please arrive at least 15 minutes before your scheduled appointment time.   If you have a lab appointment with the [facility] please come in thru the Main Entrance and check in at the main information desk.  You need to re-schedule your appointment should you arrive 10 or more minutes late.  We strive to give you quality time with our providers, and arriving late affects you and other patients whose appointments are after yours.  Also, if you no show three or more times for appointments you may be dismissed from the clinic at the providers discretion.     Again, thank you for choosing [facility].  Our hope is that these requests will decrease the amount of time that you wait before being seen by our physicians.       _____________________________________________________________  Should you have questions after your visit to [facility], please contact our office at [phone] and follow the prompts.  Our office hours are 8:00 a.m. and 4:30 p.m. Monday - Friday.  Please note that voicemails left after 4:00 p.m. may not be returned until the following business  day.  We are closed weekends and major holidays.  You do have access to a nurse 24-7, just call the main number to the clinic [phone] and do not press any options, hold on the line and a nurse will answer the phone.    For prescription refill requests, have your pharmacy contact our office and allow 72 hours.    Due to Covid, you will need to wear a mask upon entering the hospital. If you do not have a mask, a mask will be given to you at the Main Entrance upon arrival. For doctor visits, patients may have 1 support person age 85 or older with them. For treatment visits, patients can not have anyone with them due to social distancing guidelines and our immunocompromised population.

## 2021-08-08 ENCOUNTER — Encounter (INDEPENDENT_AMBULATORY_CARE_PROVIDER_SITE_OTHER): Payer: Self-pay | Admitting: *Deleted

## 2021-08-08 LAB — HOMOCYSTEINE: Homocysteine: 10.7 umol/L (ref 0.0–14.5)

## 2021-08-11 LAB — IMMUNOFIXATION ELECTROPHORESIS
IgA: 156 mg/dL (ref 90–386)
IgG (Immunoglobin G), Serum: 1263 mg/dL (ref 603–1613)
IgM (Immunoglobulin M), Srm: 101 mg/dL (ref 20–172)
Total Protein ELP: 7 g/dL (ref 6.0–8.5)

## 2021-08-12 LAB — METHYLMALONIC ACID, SERUM: Methylmalonic Acid, Quantitative: 138 nmol/L (ref 0–378)

## 2021-08-15 ENCOUNTER — Ambulatory Visit (HOSPITAL_COMMUNITY): Payer: 59 | Attending: Physician Assistant

## 2021-08-21 ENCOUNTER — Ambulatory Visit (HOSPITAL_COMMUNITY): Payer: 59

## 2021-08-21 ENCOUNTER — Inpatient Hospital Stay (HOSPITAL_COMMUNITY): Payer: 59 | Attending: Hematology

## 2021-08-21 ENCOUNTER — Other Ambulatory Visit (HOSPITAL_COMMUNITY): Payer: 59

## 2021-08-21 ENCOUNTER — Encounter (HOSPITAL_COMMUNITY): Payer: Self-pay

## 2021-08-21 VITALS — BP 138/99 | HR 65 | Temp 97.7°F | Resp 18

## 2021-08-21 DIAGNOSIS — D61818 Other pancytopenia: Secondary | ICD-10-CM

## 2021-08-21 DIAGNOSIS — E538 Deficiency of other specified B group vitamins: Secondary | ICD-10-CM | POA: Diagnosis present

## 2021-08-21 MED ORDER — CYANOCOBALAMIN 1000 MCG/ML IJ SOLN
1000.0000 ug | Freq: Once | INTRAMUSCULAR | Status: AC
  Administered 2021-08-21: 1000 ug via INTRAMUSCULAR
  Filled 2021-08-21: qty 1

## 2021-08-21 NOTE — Progress Notes (Signed)
Patient tolerated injection with no complaints voiced.  Site clean and dry with no bruising or swelling noted at site.  See MAR for details.  Band aid applied.  Patient stable during and after injection.  Vss with discharge and left in satisfactory condition with no s/s of distress noted.  

## 2021-08-21 NOTE — Patient Instructions (Signed)
[facility]  Discharge Instructions: Thank you for choosing [facility] to provide your oncology and hematology care.  If you have a lab appointment with the Cancer Center, please come in thru the Main Entrance and check in at the main information desk.  Wear comfortable clothing and clothing appropriate for easy access to any Portacath or PICC line.   We strive to give you quality time with your provider. You may need to reschedule your appointment if you arrive late (15 or more minutes).  Arriving late affects you and other patients whose appointments are after yours.  Also, if you miss three or more appointments without notifying the office, you may be dismissed from the clinic at the provider's discretion.      For prescription refill requests, have your pharmacy contact our office and allow 72 hours for refills to be completed.    Today you received the following chemotherapy and/or immunotherapy agents vitamin b12.       To help prevent nausea and vomiting after your treatment, we encourage you to take your nausea medication as directed.  BELOW ARE SYMPTOMS THAT SHOULD BE REPORTED IMMEDIATELY: *FEVER GREATER THAN 100.4 F (38 C) OR HIGHER *CHILLS OR SWEATING *NAUSEA AND VOMITING THAT IS NOT CONTROLLED WITH YOUR NAUSEA MEDICATION *UNUSUAL SHORTNESS OF BREATH *UNUSUAL BRUISING OR BLEEDING *URINARY PROBLEMS (pain or burning when urinating, or frequent urination) *BOWEL PROBLEMS (unusual diarrhea, constipation, pain near the anus) TENDERNESS IN MOUTH AND THROAT WITH OR WITHOUT PRESENCE OF ULCERS (sore throat, sores in mouth, or a toothache) UNUSUAL RASH, SWELLING OR PAIN  UNUSUAL VAGINAL DISCHARGE OR ITCHING   Items with * indicate a potential emergency and should be followed up as soon as possible or go to the Emergency Department if any problems should occur.  Please show the CHEMOTHERAPY ALERT CARD or IMMUNOTHERAPY ALERT CARD at check-in to the Emergency  Department and triage nurse.  Should you have questions after your visit or need to cancel or reschedule your appointment, please contact [facility] [phone]  and follow the prompts.  Office hours are 8:00 a.m. to 4:30 p.m. Monday - Friday. Please note that voicemails left after 4:00 p.m. may not be returned until the following business day.  We are closed weekends and major holidays. You have access to a nurse at all times for urgent questions. Please call the main number to the clinic [phone] and follow the prompts.  For any non-urgent questions, you may also contact your provider using MyChart. We now offer e-Visits for anyone 18 and older to request care online for non-urgent symptoms. For details visit [email or web address].   Also download the MyChart app! Go to the app store, search "MyChart", open the app, select [facility], and log in with your MyChart username and password.  Due to Covid, a mask is required upon entering the hospital/clinic. If you do not have a mask, one will be given to you upon arrival. For doctor visits, patients may have 1 support person aged 18 or older with them. For treatment visits, patients cannot have anyone with them due to current Covid guidelines and our immunocompromised population.  

## 2021-08-28 ENCOUNTER — Ambulatory Visit (HOSPITAL_COMMUNITY): Payer: 59 | Admitting: Physician Assistant

## 2021-09-04 ENCOUNTER — Other Ambulatory Visit: Payer: Self-pay | Admitting: Radiology

## 2021-09-05 ENCOUNTER — Encounter (HOSPITAL_COMMUNITY): Payer: Self-pay

## 2021-09-05 ENCOUNTER — Ambulatory Visit (HOSPITAL_COMMUNITY)
Admission: RE | Admit: 2021-09-05 | Discharge: 2021-09-05 | Disposition: A | Discharge status: Home / Self Care | Payer: 59 | Source: Ambulatory Visit | Attending: Physician Assistant | Admitting: Physician Assistant

## 2021-09-05 DIAGNOSIS — Z886 Allergy status to analgesic agent status: Secondary | ICD-10-CM | POA: Insufficient documentation

## 2021-09-05 DIAGNOSIS — F17210 Nicotine dependence, cigarettes, uncomplicated: Secondary | ICD-10-CM | POA: Insufficient documentation

## 2021-09-05 DIAGNOSIS — Z8249 Family history of ischemic heart disease and other diseases of the circulatory system: Secondary | ICD-10-CM | POA: Diagnosis not present

## 2021-09-05 DIAGNOSIS — R896 Abnormal cytological findings in specimens from other organs, systems and tissues: Secondary | ICD-10-CM | POA: Diagnosis not present

## 2021-09-05 DIAGNOSIS — B1920 Unspecified viral hepatitis C without hepatic coma: Secondary | ICD-10-CM | POA: Diagnosis not present

## 2021-09-05 DIAGNOSIS — Z880 Allergy status to penicillin: Secondary | ICD-10-CM | POA: Diagnosis not present

## 2021-09-05 DIAGNOSIS — I10 Essential (primary) hypertension: Secondary | ICD-10-CM | POA: Diagnosis not present

## 2021-09-05 DIAGNOSIS — D61818 Other pancytopenia: Secondary | ICD-10-CM | POA: Insufficient documentation

## 2021-09-05 LAB — CBC WITH DIFFERENTIAL/PLATELET
Abs Immature Granulocytes: 0 10*3/uL (ref 0.00–0.07)
Basophils Absolute: 0 10*3/uL (ref 0.0–0.1)
Basophils Relative: 0 %
Eosinophils Absolute: 0.1 10*3/uL (ref 0.0–0.5)
Eosinophils Relative: 3 %
HCT: 28.9 % — ABNORMAL LOW (ref 39.0–52.0)
Hemoglobin: 9.6 g/dL — ABNORMAL LOW (ref 13.0–17.0)
Immature Granulocytes: 0 %
Lymphocytes Relative: 66 %
Lymphs Abs: 2 10*3/uL (ref 0.7–4.0)
MCH: 34.9 pg — ABNORMAL HIGH (ref 26.0–34.0)
MCHC: 33.2 g/dL (ref 30.0–36.0)
MCV: 105.1 fL — ABNORMAL HIGH (ref 80.0–100.0)
Monocytes Absolute: 0 10*3/uL — ABNORMAL LOW (ref 0.1–1.0)
Monocytes Relative: 1 %
Neutro Abs: 0.9 10*3/uL — ABNORMAL LOW (ref 1.7–7.7)
Neutrophils Relative %: 30 %
Platelets: 100 10*3/uL — ABNORMAL LOW (ref 150–400)
RBC: 2.75 MIL/uL — ABNORMAL LOW (ref 4.22–5.81)
RDW: 15.9 % — ABNORMAL HIGH (ref 11.5–15.5)
WBC: 3.1 10*3/uL — ABNORMAL LOW (ref 4.0–10.5)
nRBC: 0 % (ref 0.0–0.2)

## 2021-09-05 MED ORDER — MIDAZOLAM HCL 2 MG/2ML IJ SOLN
INTRAMUSCULAR | Status: AC
  Filled 2021-09-05: qty 4

## 2021-09-05 MED ORDER — FENTANYL CITRATE (PF) 100 MCG/2ML IJ SOLN
INTRAMUSCULAR | Status: AC
  Filled 2021-09-05: qty 2

## 2021-09-05 MED ORDER — SODIUM CHLORIDE 0.9 % IV SOLN: INTRAVENOUS | Status: DC

## 2021-09-05 MED ORDER — MIDAZOLAM HCL 2 MG/2ML IJ SOLN
INTRAMUSCULAR | Status: AC | PRN
  Administered 2021-09-05 (×4): 1 mg via INTRAVENOUS

## 2021-09-05 MED ORDER — FENTANYL CITRATE (PF) 100 MCG/2ML IJ SOLN
INTRAMUSCULAR | Status: AC | PRN
  Administered 2021-09-05 (×2): 50 ug via INTRAVENOUS

## 2021-09-05 MED ORDER — LIDOCAINE HCL (PF) 1 % IJ SOLN
INTRAMUSCULAR | Status: AC | PRN
  Administered 2021-09-05: 15 mL

## 2021-09-05 NOTE — Consult Note (Signed)
Chief Complaint: Patient was seen in consultation today for CT-guided bone marrow biopsy   Referring Physician(s): [place] M  Supervising Physician: Sandi Mariscal  Patient Status: [place]  History of Present Illness: Logan Brady is a 60 y.o. male smoker with past medical history depression, hepatitis C, hypertension, and spinal stenosis who presents now with pancytopenia, bony lesions on recent imaging and abnormal free light chains.  He is scheduled today for CT-guided bone marrow biopsy for further evaluation.  Past Medical History:  Diagnosis Date   Degeneration of lumbar intervertebral disc    Depression    Heart murmur    Hepatitis C    Hepatitis C 01/27/2019   Hypertension    Lumbar radiculopathy    Spinal stenosis of lumbar region    Spondylolysis    CHRONIC BILATERAL.  GRADE L5-S1.   Tobacco abuse     History reviewed. No pertinent surgical history.  Allergies: Ibuprofen, Acetaminophen, Aspirin, and Penicillins  Medications: Prior to Admission medications   Medication Sig Start Date End Date Taking? Authorizing Provider  albuterol (VENTOLIN HFA) 108 (90 Base) MCG/ACT inhaler Inhale 1-2 puffs into the lungs every 4 (four) hours as needed for wheezing or shortness of breath.   Yes [provider]  neomycin-bacitracin-polymyxin (NEOSPORIN) ophthalmic ointment Place 1 application into both eyes 4 (four) times daily as needed (styes).   Yes [provider]  Cyanocobalamin (B-12 COMPLIANCE INJECTION) 1000 MCG/ML KIT Inject as directed every 30 (thirty) days.    [provider]  hydrocortisone cream 1 % Apply 1 application topically 4 (four) times daily as needed for itching.    [provider]  rizatriptan (MAXALT) 5 MG tablet Take 5 mg by mouth daily as needed for migraine.    [provider]  sildenafil (VIAGRA) 100 MG tablet Take 100 mg by mouth daily as needed for erectile dysfunction.    [provider]  tiZANidine (ZANAFLEX) 4 MG tablet Take 4 mg by mouth every 6 (six) hours as needed for muscle spasms. 05/07/18   [provider]  traMADol (ULTRAM) 50 MG tablet Take 50 mg by mouth every 6 (six) hours as needed for moderate pain.    [provider]     Family History  Problem Relation Age of Onset   Heart attack Mother    Lung cancer Father     Social History   Socioeconomic History   Marital status: Divorced    Spouse name: Not on file   Number of children: 4   Years of education: 12   Highest education level: High school graduate  Occupational History   Occupation: maintenance  Tobacco Use   Smoking status: Every Day    Packs/day: 1.00    Years: 40.00    Pack years: 40.00    Types: Cigarettes   Smokeless tobacco: Never  Vaping Use   Vaping Use: Never used  Substance and Sexual Activity   Alcohol use: Not Currently   Drug use: Not Currently    Comment: quit 30 years ago   Sexual activity: Yes  Other Topics Concern   Not on file  Social History Narrative   Not on file   Social Determinants of Health   Financial Resource Strain: Not on file  Food Insecurity: Not on file  Transportation Needs: Not on file  Physical Activity: Not on file  Stress: Not on file  Social Connections: Not on file      Review of Systems denies fever,  headache, chest pain, dyspnea, cough, abdominal pain, nausea, vomiting or bleeding.  He does have some chronic back pain.  Vital Signs: BP (!) 165/109   Pulse 62   Temp 98.7 F (37.1 C) (Oral)   Resp 12   Ht 6' 2"  (1.88 m)   Wt 198 lb (89.8 kg)   SpO2 100%   BMI 25.42 kg/m   Physical Exam awake, alert.  Chest clear to auscultation bilaterally.  Heart with regular rate and rhythm.  Abdomen soft, positive bowel sounds, nontender.  No lower extremity edema.  Imaging: No results found.  Labs:  CBC: Recent Labs    12/20/20 1455 04/17/21 1114 08/01/21 0937 08/01/21 1252  WBC 3.4* 3.0* 2.8*  2.6*  HGB 11.0* 11.0* 10.8* 10.2*  HCT 33.5* 33.6* 32.6* 30.4*  PLT 124* 93* 105* 105*    COAGS: Recent Labs    08/01/21 0937  INR 1.2    BMP: Recent Labs    09/30/20 0955 12/20/20 1455 08/01/21 0937 08/01/21 1252  NA 137 137 137 137  K 4.5 3.8 4.0 4.1  CL 101 105 104 105  CO2 28 26 24 23   GLUCOSE 89 100* 104* 119*  BUN 18 14 28* 30*  CALCIUM 9.0 8.9 9.6 9.0  CREATININE 1.13 0.98 1.35* 1.29*  GFRNONAA >60 >60 >60 >60    LIVER FUNCTION TESTS: Recent Labs    09/30/20 0955 12/20/20 1455 08/01/21 0937 08/01/21 1252  BILITOT 0.6 0.6 0.6 0.6  AST 15 15 19 17   ALT 9 9 12 12   ALKPHOS 41 41 37* 38  PROT 7.5 7.7 7.4 7.5  ALBUMIN 4.2 3.8 4.2 4.4    TUMOR MARKERS: No results for input(s): AFPTM, CEA, CA199, CHROMGRNA in the last 8760 hours.  Assessment and Plan: 60 y.o. male smoker with past medical history depression, hepatitis C, hypertension, and spinal stenosis who presents now with pancytopenia, bony lesions on recent imaging and abnormal free light chains.  He is scheduled today for CT-guided bone marrow biopsy for further evaluation.Risks and benefits of procedure was discussed with the patient  including, but not limited to bleeding, infection, damage to adjacent structures or low yield requiring additional tests.  All of the questions were answered and there is agreement to proceed.  Consent signed and in chart.    Thank you for this interesting consult.  I greatly enjoyed meeting Logan Brady and look forward to participating in their care.  A copy of this report was sent to the requesting provider on this date.  Electronically Signed: D. Rowe Robert, PA-C 09/05/2021, 8:36 AM   I spent a total of  20 minutes   in face to face in clinical consultation, greater than 50% of which was counseling/coordinating care for CT-guided bone marrow biopsy

## 2021-09-05 NOTE — Procedures (Signed)
Pre-procedure Diagnosis: Abnormal free light chains Post-procedure Diagnosis: Same  Technically successful CT guided bone marrow aspiration and biopsy of left iliac crest.   Complications: None Immediate  EBL: None  Signed: Sandi Mariscal Pager: [phone] 09/05/2021, 9:37 AM

## 2021-09-11 ENCOUNTER — Other Ambulatory Visit (HOSPITAL_COMMUNITY): Payer: Self-pay | Admitting: *Deleted

## 2021-09-11 DIAGNOSIS — D519 Vitamin B12 deficiency anemia, unspecified: Secondary | ICD-10-CM

## 2021-09-12 ENCOUNTER — Ambulatory Visit (HOSPITAL_COMMUNITY): Payer: 59 | Admitting: Physician Assistant

## 2021-09-12 ENCOUNTER — Encounter (HOSPITAL_COMMUNITY): Payer: Self-pay | Admitting: Hematology

## 2021-09-12 ENCOUNTER — Inpatient Hospital Stay (HOSPITAL_COMMUNITY): Payer: 59

## 2021-09-15 NOTE — Progress Notes (Deleted)
[facility] [place], [place] [address]   CLINIC:  Medical Oncology/Hematology  PCP:  Celene Squibb, MD [address] 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 male returns for routine follow-up of pancytopenia and to discuss results of recent bone marrow biopsy, which suggests diagnosis of some type of lymphoma.  He was last seen in office by Tarri Abernethy PA-C on 08/07/2021.  At today's visit, he reports feeling ***.  No recent hospitalizations, surgeries, or changes in baseline health status.  *** Painless swelling of lymph nodes *** Persistent fatigue *** Fever *** Night sweats *** Shortness of breath *** Unexplained weight loss *** Itchy skin  *** Infections *** Chest pain, DOE, dizziness, syncope *** Bruising, petechial rash *** Bleeding  He has ***% energy and ***% appetite. He endorses that he is maintaining a stable weight.    REVIEW OF SYSTEMS:  Review of Systems - Oncology    PAST MEDICAL/SURGICAL HISTORY:  Past Medical History:  Diagnosis Date   Degeneration of lumbar intervertebral disc    Depression    Heart murmur    Hepatitis C    Hepatitis C 01/27/2019   Hypertension    Lumbar radiculopathy    Spinal stenosis of lumbar region    Spondylolysis    CHRONIC BILATERAL.  GRADE L5-S1.   Tobacco abuse    No past surgical history on file.   SOCIAL HISTORY:  Social History   Socioeconomic History   Marital status: Divorced    Spouse name: Not on file   Number of children: 4   Years of education: 12   Highest education level: High school graduate  Occupational History   Occupation: maintenance  Tobacco Use   Smoking status: Every Day    Packs/day: 1.00    Years: 40.00    Pack years: 40.00    Types: Cigarettes    Smokeless tobacco: Never  Vaping Use   Vaping Use: Never used  Substance and Sexual Activity   Alcohol use: Not Currently   Drug use: Not Currently    Comment: quit 30 years ago   Sexual activity: Yes  Other Topics Concern   Not on file  Social History Narrative   Not on file   Social Determinants of Health   Financial Resource Strain: Not on file  Food Insecurity: Not on file  Transportation Needs: Not on file  Physical Activity: Not on file  Stress: Not on file  Social Connections: Not on file  Intimate Partner Violence: Not on file    FAMILY HISTORY:  Family History  Problem Relation Age of Onset   Heart attack Mother    Lung cancer Father     CURRENT MEDICATIONS:  Outpatient Encounter Medications as of 09/18/2021  Medication Sig   albuterol (VENTOLIN HFA) 108 (90 Base) MCG/ACT inhaler Inhale 1-2 puffs into the lungs every 4 (four) hours as needed for wheezing or shortness of breath.   Cyanocobalamin (B-12 COMPLIANCE INJECTION) 1000 MCG/ML KIT Inject as directed every 30 (thirty) days.   hydrocortisone cream 1 % Apply 1 application topically 4 (four) times daily as needed for itching.   neomycin-bacitracin-polymyxin (NEOSPORIN) ophthalmic ointment Place 1 application into both eyes 4 (four) times daily as needed (styes).   rizatriptan (MAXALT) 5 MG tablet Take 5  mg by mouth daily as needed for migraine.   sildenafil (VIAGRA) 100 MG tablet Take 100 mg by mouth daily as needed for erectile dysfunction.   tiZANidine (ZANAFLEX) 4 MG tablet Take 4 mg by mouth every 6 (six) hours as needed for muscle spasms.   traMADol (ULTRAM) 50 MG tablet Take 50 mg by mouth every 6 (six) hours as needed for moderate pain.   Facility-Administered Encounter Medications as of 09/18/2021  Medication   cyanocobalamin ((VITAMIN B-12)) injection 1,000 mcg   cyanocobalamin ((VITAMIN B-12)) injection 1,000 mcg    ALLERGIES:  Allergies  Allergen Reactions   Ibuprofen Hives and Itching    Acetaminophen Hives and Itching   Aspirin Hives and Itching   Penicillins     Turned red and felt "pins and needles" in his feet     PHYSICAL EXAM:  ECOG PERFORMANCE STATUS: {CHL ONC ECOG PS:[id number]}  There were no vitals filed for this visit. There were no vitals filed for this visit. Physical Exam   LABORATORY DATA:  I have reviewed the labs as listed.  CBC    Component Value Date/Time   WBC 3.1 (L) 09/05/2021 0749   RBC 2.75 (L) 09/05/2021 0749   HGB 9.6 (L) 09/05/2021 0749   HCT 28.9 (L) 09/05/2021 0749   PLT 100 (L) 09/05/2021 0749   MCV 105.1 (H) 09/05/2021 0749   MCH 34.9 (H) 09/05/2021 0749   MCHC 33.2 09/05/2021 0749   RDW 15.9 (H) 09/05/2021 0749   LYMPHSABS 2.0 09/05/2021 0749   MONOABS 0.0 (L) 09/05/2021 0749   EOSABS 0.1 09/05/2021 0749   BASOSABS 0.0 09/05/2021 0749   CMP Latest Ref Rng & Units 08/01/2021 08/01/2021 12/20/2020  Glucose 70 - 99 mg/dL 119(H) 104(H) 100(H)  BUN 6 - 20 mg/dL 30(H) 28(H) 14  Creatinine 0.61 - 1.24 mg/dL 1.29(H) 1.35(H) 0.98  Sodium 135 - 145 mmol/L 137 137 137  Potassium 3.5 - 5.1 mmol/L 4.1 4.0 3.8  Chloride 98 - 111 mmol/L 105 104 105  CO2 22 - 32 mmol/L _0 Calcium 8.9 - 10.3 mg/dL 9.0 9.6 8.9  Total Protein 6.5 - 8.1 g/dL 7.5 7.4 7.7  Total Bilirubin 0.3 - 1.2 mg/dL 0.6 0.6 0.6  Alkaline Phos 38 - 126 U/L 38 37(L) 41  AST 15 - 41 U/L _1 ALT 0 - 44 U/L _2 DIAGNOSTIC IMAGING:  I have independently reviewed the relevant imaging and discussed with the patient.  ASSESSMENT & PLAN: 1.  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  There does appear to be some associated fibrosis which raises the possibility of hairy cell leukemia.  In addition, some of the neoplastic lymphocytes express cyclin D1, so mantle cell lymphoma should be excluded. - [facility] (09/05/2021): Normal male karyotype (26, XY),  but limited by the fact that only 2 metaphase cells were available for analysis - FISH panel is pending. - Bone marrow biopsy obtained on 09/05/2021 due to unexplained pancytopenia as well as abnormal findings from MRI spine - Abnormal bone marrow noted on MRI from [facility] (03/20/2021): "Numerous T1 hypointense lesions throughout the lumbosacral spine are present with most pronounced STIR hyperintensity seen in sacral and iliac lesions.  These are suspicious for marrow replacing lesions with considerations including multiple myeloma, lymphoma, and metastatic disease." - Patient has history of hepatitis C - CT abdomen/pelvis (08/13/2018): Fatty infiltration of the liver with  mild splenomegaly - Most recent labs (08/01/2021): WBC 2.6 with ANC 0.9, Hgb 10.2 with MCV 105.2, platelets 105; CMP with slightly elevated creatinine 1.29; vitamin B12 normal at 478; SPEP normal; kappa light chain slightly elevated at 47.7, lambda light chains normal at 18.2, kappa lambda light chain ratio slightly elevated at 2.62.  Immunofixation was negative for monoclonal protein. - Symptoms *** - Physical exam *** - PLAN:   We will check PET scan from skull base to thigh.  MD visit with Dr. Delton Coombes after PET scan. - *** Any lymphadenopathy???  GET BIOPSY!  ***  2.  Pancytopenia with macrocytosis - Seen for initial consultation of pancytopenia on 05/07/2018 - Pancytopenia with macrocytosis suspected to be secondary to liver disease and history of B12 deficiency - Patient found to be vitamin B12 deficient during initial work-up, has been receiving monthly B12 injections since that time - Work-up of pancytopenia found patient to be positive for hepatitis C, s/p treatment in 2020.  HIV was negative. - CT abdomen/pelvis (08/13/2018): Fatty infiltration of the liver with mild splenomegaly - Abdominal ultrasound with elastography (01/29/2019): Mild splenomegaly with liver abnormalities suggesting possible cirrhosis - Reports  mild fatigue and easy bruising.  Reports ongoing acneiform skin infections of his face, but denies any other frequent infections. - Denies any significant blood loss such as gross epistaxis, hematemesis, hematochezia, or melena. - Most recent labs (08/01/2021): WBC 2.6 with ANC 0.9, Hgb 10.2 with MCV 105.2, platelets 105; CMP with slightly elevated creatinine 1.29; vitamin B12 normal at 478; SPEP normal; kappa light chain slightly elevated at 47.7, lambda light chains normal at 18.2, kappa lambda light chain ratio slightly elevated at 2.62 .  Immunofixation was negative for monoclonal protein.  Normal folate, homocystine, methylmalonic acid, ferritin, iron/TIBC. - Differential diagnosis favors pancytopenia and macrocytosis secondary to lymphoma, B12 deficiency and liver disease.   - PLAN: Continue monthly B12 injections.  Continue treatment of liver disease with GI team.  Work-up of lymphoma is pending.    3.  Possible cirrhosis with history of hepatitis C - Hepatitis C diagnosed during work-up for pancytopenia - He was referred to hepatology and completed hepatitis C treatment x3 months - Abdominal ultrasound with elastography (01/29/2019): Heterogeneous and slightly coarse echogenicity with irregular liver contour suggesting cirrhosis; mild splenomegaly - Patient reports that he has never been told he has cirrhosis, and that he does not currently follow with GI or hepatology - PLAN: He has been referred to gastroenterology for work-up and management of possible cirrhosis.  4.  Abnormal free light chain ratio - SPEP normal; kappa light chain slightly elevated at 47.7, lambda light chains normal at 18.2, light chain ratio slightly elevated at 2.62.  Immunofixation was negative for monoclonal protein. - Elevated light chains likely attributable to lymphoma and mild CKD   5.  B12 deficiency - Most recent vitamin B12 (08/01/2021) within normal limits - PLAN: Continue monthly B12 injections.   6.   Tobacco use - Patient has about a 40-pack-year history of tobacco use - PLAN: We will defer LDCT scans for the time being while we are working patient up for lymphoma.    PLAN SUMMARY & DISPOSITION: ***  All questions were answered. The patient knows to call the clinic with any problems, questions or concerns.  Medical decision making: ***  Time spent on visit: I spent {CHL ONC TIME VISIT - [id number]} counseling the patient face to face. The total time spent in the appointment was {CHL ONC TIME VISIT - OZDGU:[id number]}  and more than 50% was on counseling.   Harriett Rush, PA-C  ***

## 2021-09-18 ENCOUNTER — Ambulatory Visit (HOSPITAL_COMMUNITY): Payer: 59

## 2021-09-18 ENCOUNTER — Other Ambulatory Visit (HOSPITAL_COMMUNITY): Payer: 59

## 2021-09-18 ENCOUNTER — Inpatient Hospital Stay (HOSPITAL_COMMUNITY): Payer: 59 | Admitting: Physician Assistant

## 2021-09-18 ENCOUNTER — Inpatient Hospital Stay (HOSPITAL_COMMUNITY): Payer: 59 | Attending: Hematology

## 2021-09-18 DIAGNOSIS — E538 Deficiency of other specified B group vitamins: Secondary | ICD-10-CM | POA: Insufficient documentation

## 2021-09-18 DIAGNOSIS — B1920 Unspecified viral hepatitis C without hepatic coma: Secondary | ICD-10-CM | POA: Insufficient documentation

## 2021-09-18 DIAGNOSIS — Z801 Family history of malignant neoplasm of trachea, bronchus and lung: Secondary | ICD-10-CM | POA: Insufficient documentation

## 2021-09-18 DIAGNOSIS — D61818 Other pancytopenia: Secondary | ICD-10-CM | POA: Insufficient documentation

## 2021-09-18 DIAGNOSIS — R161 Splenomegaly, not elsewhere classified: Secondary | ICD-10-CM | POA: Insufficient documentation

## 2021-09-18 DIAGNOSIS — I10 Essential (primary) hypertension: Secondary | ICD-10-CM | POA: Insufficient documentation

## 2021-09-18 DIAGNOSIS — R590 Localized enlarged lymph nodes: Secondary | ICD-10-CM | POA: Insufficient documentation

## 2021-09-18 DIAGNOSIS — C8510 Unspecified B-cell lymphoma, unspecified site: Secondary | ICD-10-CM | POA: Insufficient documentation

## 2021-09-18 DIAGNOSIS — F17210 Nicotine dependence, cigarettes, uncomplicated: Secondary | ICD-10-CM | POA: Insufficient documentation

## 2021-09-18 NOTE — Progress Notes (Signed)
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 male returns for routine follow-up of pancytopenia and to discuss results of recent bone marrow biopsy, which suggests diagnosis of some type of lymphoma.  He was last seen in office by Tarri Abernethy PA-C on 08/07/2021.  At today's visit, he reports feeling fairly well.  No recent hospitalizations, surgeries, or changes in baseline health status.  Patient reports that he has an intermittent "knot" in his left groin, which waxes and wanes and is at times mildly tender.  He believes it to be associated with occasional boils and pustules on his upper thigh.  He denies any other lymphadenopathy.  He has persistent mild fatigue, with energy about 75%.  He reports that he usually feels better after he gets B12 injection. He denies any fever, chills, night sweats. No shortness of breath.  He has not noted any unexplained weight loss. No abnormal pruritus of the skin.  He denies any frequent infections, but does state that he has pustules that come and go on his nose.  No bleeding, bruising, or petechial rash.  He has 75% energy and 75% appetite. He endorses that he is maintaining a stable weight.   REVIEW OF SYSTEMS:  Review of Systems  Constitutional:  Positive for fatigue (75%). Negative for appetite change, chills, diaphoresis, fever and unexpected weight change.  HENT:   Negative for lump/mass and nosebleeds.   Eyes:  Negative for eye problems.  Respiratory:  Negative for cough, hemoptysis and shortness of breath.   Cardiovascular:  Negative for chest pain, leg swelling and palpitations.   Gastrointestinal:  Negative for abdominal pain, blood in stool, constipation, diarrhea, nausea and vomiting.  Genitourinary:  Negative for hematuria.   Musculoskeletal:  Positive for back pain.  Skin: Negative.   Neurological:  Negative for dizziness, headaches and light-headedness.  Hematological:  Does not bruise/bleed easily.     PAST MEDICAL/SURGICAL HISTORY:  Past Medical History:  Diagnosis Date   Degeneration of lumbar intervertebral disc    Depression    Heart murmur    Hepatitis C    Hepatitis C 01/27/2019   Hypertension    Lumbar radiculopathy    Spinal stenosis of lumbar region    Spondylolysis    CHRONIC BILATERAL.  GRADE L5-S1.   Tobacco abuse    No past surgical history on file.   SOCIAL HISTORY:  Social History   Socioeconomic History   Marital status: Divorced    Spouse name: Not on file   Number of children: 4   Years of education: 12   Highest education level: High school graduate  Occupational History   Occupation: maintenance  Tobacco Use   Smoking status: Every Day    Packs/day: 1.00    Years: 40.00    Pack years: 40.00    Types: Cigarettes   Smokeless tobacco: Never  Vaping Use   Vaping Use: Never used  Substance and Sexual Activity   Alcohol use: Not Currently   Drug use: Not Currently    Comment: quit 30 years ago   Sexual activity: Yes  Other Topics Concern   Not on file  Social History Narrative  Not on file   Social Determinants of Health   Financial Resource Strain: Not on file  Food Insecurity: Not on file  Transportation Needs: Not on file  Physical Activity: Not on file  Stress: Not on file  Social Connections: Not on file  Intimate Partner Violence: Not on file    FAMILY HISTORY:  Family History  Problem Relation Age of Onset   Heart attack Mother    Lung cancer Father     CURRENT MEDICATIONS:  Outpatient Encounter Medications as of 09/19/2021  Medication Sig   albuterol (VENTOLIN HFA) 108 (90 Base) MCG/ACT  inhaler Inhale 1-2 puffs into the lungs every 4 (four) hours as needed for wheezing or shortness of breath.   Cyanocobalamin (B-12 COMPLIANCE INJECTION) 1000 MCG/ML KIT Inject as directed every 30 (thirty) days.   hydrocortisone cream 1 % Apply 1 application topically 4 (four) times daily as needed for itching.   neomycin-bacitracin-polymyxin (NEOSPORIN) ophthalmic ointment Place 1 application into both eyes 4 (four) times daily as needed (styes).   rizatriptan (MAXALT) 5 MG tablet Take 5 mg by mouth daily as needed for migraine.   sildenafil (VIAGRA) 100 MG tablet Take 100 mg by mouth daily as needed for erectile dysfunction.   tiZANidine (ZANAFLEX) 4 MG tablet Take 4 mg by mouth every 6 (six) hours as needed for muscle spasms.   traMADol (ULTRAM) 50 MG tablet Take 50 mg by mouth every 6 (six) hours as needed for moderate pain.   Facility-Administered Encounter Medications as of 09/19/2021  Medication   cyanocobalamin ((VITAMIN B-12)) injection 1,000 mcg   cyanocobalamin ((VITAMIN B-12)) injection 1,000 mcg    ALLERGIES:  Allergies  Allergen Reactions   Ibuprofen Hives and Itching   Acetaminophen Hives and Itching   Aspirin Hives and Itching   Penicillins     Turned red and felt "pins and needles" in his feet     PHYSICAL EXAM:  ECOG PERFORMANCE STATUS: 0 - Asymptomatic  There were no vitals filed for this visit. There were no vitals filed for this visit. Physical Exam Constitutional:      Appearance: Normal appearance.  HENT:     Head: Normocephalic and atraumatic.     Mouth/Throat:     Mouth: Mucous membranes are moist.  Eyes:     Extraocular Movements: Extraocular movements intact.     Pupils: Pupils are equal, round, and reactive to light.  Cardiovascular:     Rate and Rhythm: Normal rate and regular rhythm.     Pulses: Normal pulses.     Heart sounds: Normal heart sounds.  Pulmonary:     Effort: Pulmonary effort is normal.     Breath sounds: Normal breath sounds.   Abdominal:     General: Bowel sounds are normal.     Palpations: Abdomen is soft. There is no hepatomegaly or splenomegaly.     Tenderness: There is no abdominal tenderness.  Musculoskeletal:        General: No swelling.     Right lower leg: No edema.     Left lower leg: No edema.  Lymphadenopathy:     Head:     Right side of head: No submental, submandibular, tonsillar, preauricular, posterior auricular or occipital adenopathy.     Left side of head: No submental, submandibular, tonsillar, preauricular, posterior auricular or occipital adenopathy.     Cervical: No cervical adenopathy.     Right cervical: No superficial, deep or posterior cervical adenopathy.    Left cervical: No superficial, deep  or posterior cervical adenopathy.     Upper Body:     Right upper body: No supraclavicular, axillary, pectoral or epitrochlear adenopathy.     Left upper body: No supraclavicular, axillary, pectoral or epitrochlear adenopathy.     Lower Body: No right inguinal adenopathy. Left inguinal adenopathy present.     Comments: Left inguinal adenopathy, approximately 1 cm x 3 cm by palpation.  Nontender.  Skin:    General: Skin is warm and dry.     Comments: Erythema and acneiform skin overlying nose  Neurological:     General: No focal deficit present.     Mental Status: He is alert and oriented to person, place, and time.  Psychiatric:        Mood and Affect: Mood normal.        Behavior: Behavior normal.     LABORATORY DATA:  I have reviewed the labs as listed.  CBC    Component Value Date/Time   WBC 3.1 (L) 09/05/2021 0749   RBC 2.75 (L) 09/05/2021 0749   HGB 9.6 (L) 09/05/2021 0749   HCT 28.9 (L) 09/05/2021 0749   PLT 100 (L) 09/05/2021 0749   MCV 105.1 (H) 09/05/2021 0749   MCH 34.9 (H) 09/05/2021 0749   MCHC 33.2 09/05/2021 0749   RDW 15.9 (H) 09/05/2021 0749   LYMPHSABS 2.0 09/05/2021 0749   MONOABS 0.0 (L) 09/05/2021 0749   EOSABS 0.1 09/05/2021 0749   BASOSABS 0.0  09/05/2021 0749   CMP Latest Ref Rng & Units 08/01/2021 08/01/2021 12/20/2020  Glucose 70 - 99 mg/dL 119(H) 104(H) 100(H)  BUN 6 - 20 mg/dL 30(H) 28(H) 14  Creatinine 0.61 - 1.24 mg/dL 1.29(H) 1.35(H) 0.98  Sodium 135 - 145 mmol/L 137 137 137  Potassium 3.5 - 5.1 mmol/L 4.1 4.0 3.8  Chloride 98 - 111 mmol/L 105 104 105  CO2 22 - 32 mmol/L 23 24 26   Calcium 8.9 - 10.3 mg/dL 9.0 9.6 8.9  Total Protein 6.5 - 8.1 g/dL 7.5 7.4 7.7  Total Bilirubin 0.3 - 1.2 mg/dL 0.6 0.6 0.6  Alkaline Phos 38 - 126 U/L 38 37(L) 41  AST 15 - 41 U/L 17 19 15   ALT 0 - 44 U/L 12 12 9     DIAGNOSTIC IMAGING:  I have independently reviewed the relevant imaging and discussed with the patient.  ASSESSMENT & PLAN: 1.  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  There does appear to be some associated fibrosis which raises the possibility of hairy cell leukemia.  In addition, some of the neoplastic lymphocytes express cyclin D1, so mantle cell lymphoma should be excluded. - [facility] (09/05/2021): Normal male karyotype (1, XY), but limited by the fact that only 2 metaphase cells were available for analysis - FISH panel is pending. - Bone marrow biopsy obtained on 09/05/2021 due to unexplained pancytopenia as well as abnormal findings from MRI spine - Abnormal bone marrow noted on MRI from [facility] (03/20/2021): "Numerous T1 hypointense lesions throughout the lumbosacral spine are present with most pronounced STIR hyperintensity seen in sacral and iliac lesions.  These are suspicious for marrow replacing lesions with considerations including multiple myeloma, lymphoma, and metastatic disease." - Patient has history of hepatitis C - CT abdomen/pelvis (08/13/2018): Fatty infiltration of the liver with mild splenomegaly - Most recent labs (08/01/2021): WBC 2.6 with ANC 0.9, Hgb 10.2 with MCV 105.2, platelets 105; CMP with  slightly elevated creatinine 1.29; vitamin  B12 normal at 478; SPEP normal; kappa light chain slightly elevated at 47.7, lambda light chains normal at 18.2, kappa lambda light chain ratio slightly elevated at 2.62.  Immunofixation was negative for monoclonal protein. - No B symptoms such as fever, chills, night sweats, unintentional weight loss - Patient reports left groin lymphadenopathy that waxes and wanes, and which he associates with intermittent boils on his thigh. - Physical exam reveals left inguinal adenopathy, approximately 1 cm x 3 cm by palpation, which is nontender. - PLAN:   We will check PET scan from skull base to thigh.  MD visit with Dr. Delton Coombes after PET scan. - We will hold off on biopsy of left inguinal adenopathy until after PET scan, as it may be reactive related to intermittent skin pustules.  2.  Pancytopenia with macrocytosis - Seen for initial consultation of pancytopenia on 05/07/2018 - Patient found to be vitamin B12 deficient during initial work-up, has been receiving monthly B12 injections since that time - Work-up of pancytopenia found patient to be positive for hepatitis C, s/p treatment in 2020.  HIV was negative. - CT abdomen/pelvis (08/13/2018): Fatty infiltration of the liver with mild splenomegaly - Abdominal ultrasound with elastography (01/29/2019): Mild splenomegaly with liver abnormalities suggesting possible cirrhosis - Reports mild fatigue and easy bruising.  Reports ongoing acneiform skin infections of his face, but denies any other frequent infections. - Denies any significant blood loss such as gross epistaxis, hematemesis, hematochezia, or melena. - Most recent labs (08/01/2021): WBC 2.6 with ANC 0.9, Hgb 10.2 with MCV 105.2, platelets 105; CMP with slightly elevated creatinine 1.29; vitamin B12 normal at 478; SPEP normal; kappa light chain slightly elevated at 47.7, lambda light chains normal at 18.2, kappa lambda light chain ratio slightly elevated at  2.62 .  Immunofixation was negative for monoclonal protein.  Normal folate, homocystine, methylmalonic acid, ferritin, iron/TIBC. - Differential diagnosis favors pancytopenia and macrocytosis secondary to lymphoma, B12 deficiency and liver disease.   - PLAN: Continue monthly B12 injections.  Continue treatment of liver disease with GI team.  Work-up of lymphoma is pending.    3.  Possible cirrhosis with history of hepatitis C - Hepatitis C diagnosed during work-up for pancytopenia - He was referred to hepatology and completed hepatitis C treatment x3 months - Abdominal ultrasound with elastography (01/29/2019): Heterogeneous and slightly coarse echogenicity with irregular liver contour suggesting cirrhosis; mild splenomegaly - Patient reports that he has never been told he has cirrhosis, and that he does not currently follow with GI or hepatology - PLAN: He has been referred to gastroenterology for work-up and management of possible cirrhosis.  4.  Abnormal free light chain ratio - SPEP normal; kappa light chain slightly elevated at 47.7, lambda light chains normal at 18.2, light chain ratio slightly elevated at 2.62.  Immunofixation was negative for monoclonal protein. - Elevated light chains likely attributable to lymphoma and mild CKD   5.  B12 deficiency - Most recent vitamin B12 (08/01/2021) within normal limits - PLAN: Continue monthly B12 injections.   6.  Tobacco use - Patient has about a 40-pack-year history of tobacco use - PLAN: We will defer LDCT scans for the time being while we are working patient up for lymphoma.   PLAN SUMMARY & DISPOSITION: PET scan and follow-up with Dr. Raliegh Ip after results obtained  All questions were answered. The patient knows to call the clinic with any problems, questions or concerns.  Medical decision making: Moderate  Time spent on visit: I spent 20  minutes counseling the patient face to face. The total time spent in the appointment was 30 minutes  and more than 50% was on counseling.   Harriett Rush, PA-C  09/19/2021 1:11 PM

## 2021-09-19 ENCOUNTER — Inpatient Hospital Stay (HOSPITAL_COMMUNITY): Payer: 59

## 2021-09-19 ENCOUNTER — Other Ambulatory Visit: Payer: Self-pay

## 2021-09-19 ENCOUNTER — Inpatient Hospital Stay (HOSPITAL_BASED_OUTPATIENT_CLINIC_OR_DEPARTMENT_OTHER): Payer: 59 | Admitting: Physician Assistant

## 2021-09-19 ENCOUNTER — Encounter (HOSPITAL_COMMUNITY): Payer: Self-pay | Admitting: Hematology

## 2021-09-19 VITALS — BP 133/90 | HR 56 | Temp 97.3°F | Resp 18 | Wt 199.5 lb

## 2021-09-19 DIAGNOSIS — F17210 Nicotine dependence, cigarettes, uncomplicated: Secondary | ICD-10-CM | POA: Diagnosis not present

## 2021-09-19 DIAGNOSIS — C8590 Non-Hodgkin lymphoma, unspecified, unspecified site: Secondary | ICD-10-CM | POA: Diagnosis not present

## 2021-09-19 DIAGNOSIS — B1920 Unspecified viral hepatitis C without hepatic coma: Secondary | ICD-10-CM

## 2021-09-19 DIAGNOSIS — Z801 Family history of malignant neoplasm of trachea, bronchus and lung: Secondary | ICD-10-CM | POA: Diagnosis not present

## 2021-09-19 DIAGNOSIS — D61818 Other pancytopenia: Secondary | ICD-10-CM

## 2021-09-19 DIAGNOSIS — R590 Localized enlarged lymph nodes: Secondary | ICD-10-CM | POA: Diagnosis not present

## 2021-09-19 DIAGNOSIS — D519 Vitamin B12 deficiency anemia, unspecified: Secondary | ICD-10-CM | POA: Diagnosis not present

## 2021-09-19 DIAGNOSIS — I10 Essential (primary) hypertension: Secondary | ICD-10-CM | POA: Diagnosis not present

## 2021-09-19 DIAGNOSIS — E538 Deficiency of other specified B group vitamins: Secondary | ICD-10-CM | POA: Diagnosis present

## 2021-09-19 DIAGNOSIS — C8510 Unspecified B-cell lymphoma, unspecified site: Secondary | ICD-10-CM | POA: Diagnosis present

## 2021-09-19 DIAGNOSIS — R161 Splenomegaly, not elsewhere classified: Secondary | ICD-10-CM | POA: Diagnosis not present

## 2021-09-19 MED ORDER — CYANOCOBALAMIN 1000 MCG/ML IJ SOLN
1000.0000 ug | Freq: Once | INTRAMUSCULAR | Status: AC
Start: 2021-09-19 — End: 2021-09-19
  Administered 2021-09-19: 1000 ug via INTRAMUSCULAR
  Filled 2021-09-19: qty 1

## 2021-09-19 NOTE — Patient Instructions (Signed)
[place] at [facility] Discharge Instructions  You were seen today by Tarri Abernethy PA-C to discuss results of your bone marrow biopsy.  Your bone marrow biopsy showed signs of a type of cancer called lymphoma.  We will need to run additional tests to determine the specific type of your lymphoma and come up with a treatment plan.  We will check a PET scan and we will have you follow-up with Dr. Delton Coombes to discuss results.  LABS: Return in about 3 to 4 weeks for labs  OTHER TESTS: PET scan  FOLLOW-UP APPOINTMENT: Office visit with Dr. Delton Coombes after PET scan (about 3 to 4 weeks)   Thank you for choosing [place] at [facility] to provide your oncology and hematology care.  To afford each patient quality time with our provider, please arrive at least 15 minutes before your scheduled appointment time.   If you have a lab appointment with the [facility] please come in thru the Main Entrance and check in at the main information desk.  You need to re-schedule your appointment should you arrive 10 or more minutes late.  We strive to give you quality time with our providers, and arriving late affects you and other patients whose appointments are after yours.  Also, if you no show three or more times for appointments you may be dismissed from the clinic at the providers discretion.     Again, thank you for choosing [facility].  Our hope is that these requests will decrease the amount of time that you wait before being seen by our physicians.       _____________________________________________________________  Should you have questions after your visit to [facility], please contact our office at [phone] and follow the prompts.  Our office hours are 8:00 a.m. and 4:30 p.m. Monday - Friday.  Please note that voicemails left after 4:00 p.m. may not be returned until the following business day.  We are closed  weekends and major holidays.  You do have access to a nurse 24-7, just call the main number to the clinic [phone] and do not press any options, hold on the line and a nurse will answer the phone.    For prescription refill requests, have your pharmacy contact our office and allow 72 hours.    Due to Covid, you will need to wear a mask upon entering the hospital. If you do not have a mask, a mask will be given to you at the Main Entrance upon arrival. For doctor visits, patients may have 1 support person age 22 or older with them. For treatment visits, patients can not have anyone with them due to social distancing guidelines and our immunocompromised population.

## 2021-09-19 NOTE — Progress Notes (Signed)
Logan Brady presents today for injection per the provider Tarri Abernethy, PA orders.  Vitamin B12 administration without incident; injection site WNL; see MAR for injection details.  Patient tolerated procedure well and without incident. Patient remained stable throughout visit.  No questions or complaints noted at this time. Patient discharged ambulatory and in stable condition.

## 2021-09-22 ENCOUNTER — Encounter (HOSPITAL_COMMUNITY): Payer: Self-pay | Admitting: Hematology

## 2021-09-28 ENCOUNTER — Ambulatory Visit (HOSPITAL_COMMUNITY): Payer: 59 | Admitting: Physician Assistant

## 2021-10-07 LAB — SURGICAL PATHOLOGY

## 2021-10-12 ENCOUNTER — Encounter (HOSPITAL_COMMUNITY)
Admission: RE | Admit: 2021-10-12 | Discharge: 2021-10-12 | Disposition: A | Discharge status: Home / Self Care | Payer: 59 | Source: Ambulatory Visit | Attending: Physician Assistant | Admitting: Physician Assistant

## 2021-10-12 ENCOUNTER — Ambulatory Visit (INDEPENDENT_AMBULATORY_CARE_PROVIDER_SITE_OTHER): Payer: 59 | Admitting: Gastroenterology

## 2021-10-12 ENCOUNTER — Encounter (INDEPENDENT_AMBULATORY_CARE_PROVIDER_SITE_OTHER): Payer: Self-pay | Admitting: Gastroenterology

## 2021-10-12 ENCOUNTER — Other Ambulatory Visit: Payer: Self-pay

## 2021-10-12 DIAGNOSIS — R932 Abnormal findings on diagnostic imaging of liver and biliary tract: Secondary | ICD-10-CM | POA: Diagnosis not present

## 2021-10-12 DIAGNOSIS — K7400 Hepatic fibrosis, unspecified: Secondary | ICD-10-CM | POA: Diagnosis not present

## 2021-10-12 DIAGNOSIS — C8590 Non-Hodgkin lymphoma, unspecified, unspecified site: Secondary | ICD-10-CM | POA: Insufficient documentation

## 2021-10-12 DIAGNOSIS — Z8619 Personal history of other infectious and parasitic diseases: Secondary | ICD-10-CM

## 2021-10-12 MED ORDER — FLUDEOXYGLUCOSE F - 18 (FDG) INJECTION
10.3230 | Freq: Once | INTRAVENOUS | Status: AC | PRN
  Administered 2021-10-12: 10.323 via INTRAVENOUS

## 2021-10-12 NOTE — Progress Notes (Signed)
Maylon Peppers, M.D. Gastroenterology & Hepatology [facility] [address] [place], [place] [address]  Primary Care Physician: Celene Squibb, MD [place] [address]  I will communicate my assessment and recommendations to the referring MD via EMR.  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  The patient was last seen on 04/23/2019.  He was being evaluated for treatment for hepatitis C.As part of the evaluation prior to his hepatitis C treatment he underwent a liver elastography on 01/29/2019 which showed Metavir fibrosis score is F2 + some F3.  However, liver had irregular contour suggestive of cirrhosis. Patient possibly underwent 12 week treatment with Epclusa but documentation in the chart is very poor. Per notes, patient had orders sent to check for HCV clearance but no labs were performed/I am not able to see these results.  Patient reports that he recently had some alteration in his blood workup and this led to investigation, with a final diagnosis of lymphoma recently.  He underwent a PET scan today but results are not available.  Most recent blood work-up from 08/01/2021 showed ALT of 12, AST of 17, alkaline phosphatase of 38, creatinine of 1.29, normal electrolytes, total bilirubin 0.6, normal iron stores.  His most recent CBC showed a hemoglobin of 9.6, platelets of 100,000, although cell count of 3.1.  The patient denies having any nausea, vomiting, fever, chills, hematochezia, melena, hematemesis, abdominal distention, abdominal pain, diarrhea, jaundice,  pruritus. Had weight loss before getting treatment for hepatitis C, but now has gained weight.  He does not do any drugs. He drinks a couple of drinks per week.  He believes he had hepatitis C after he got a tattoo in the past.  Last EGD: never Last Colonoscopy: never  Past Medical History: Past Medical History:  Diagnosis Date   Degeneration of lumbar intervertebral disc    Depression    Heart murmur    Hepatitis C    Hepatitis C 01/27/2019   Hypertension    Lumbar radiculopathy    Spinal stenosis of lumbar region    Spondylolysis    CHRONIC BILATERAL.  GRADE L5-S1.   Tobacco abuse     Past Surgical History:History reviewed. No pertinent surgical history.  Family History: Family History  Problem Relation Age of Onset   Heart attack Mother    Lung cancer Father     Social History: Social History   Tobacco Use  Smoking Status Every Day   Packs/day: 1.00   Years: 40.00   Pack years: 40.00   Types: Cigarettes  Smokeless Tobacco Never   Social History   Substance and Sexual Activity  Alcohol Use Yes   Comment: a couple Q few weeks   Social History   Substance and Sexual Activity  Drug Use Not Currently   Comment: quit 30 years ago    Allergies: Allergies  Allergen Reactions   Ibuprofen Hives and Itching   Acetaminophen Hives and Itching   Aspirin Hives and Itching   Penicillins     Turned red and felt "pins and needles" in his feet    Medications: Current Outpatient Medications  Medication Sig Dispense Refill  albuterol (VENTOLIN HFA) 108 (90 Base) MCG/ACT inhaler Inhale 1-2 puffs into the lungs every 4 (four) hours as needed for wheezing or shortness of breath.     Cyanocobalamin (B-12 COMPLIANCE INJECTION) 1000 MCG/ML KIT Inject as directed every 30 (thirty) days.     hydrocortisone cream 1 % Apply 1 application topically 4 (four) times daily as needed for itching.     neomycin-bacitracin-polymyxin (NEOSPORIN) ophthalmic ointment Place 1  application into both eyes 4 (four) times daily as needed (styes).     rizatriptan (MAXALT) 5 MG tablet Take 5 mg by mouth daily as needed for migraine.     sildenafil (VIAGRA) 100 MG tablet Take 100 mg by mouth daily as needed for erectile dysfunction.     tiZANidine (ZANAFLEX) 4 MG tablet Take 4 mg by mouth every 6 (six) hours as needed for muscle spasms.  1   traMADol (ULTRAM) 50 MG tablet Take 50 mg by mouth every 6 (six) hours as needed for moderate pain.     No current facility-administered medications for this visit.   Facility-Administered Medications Ordered in Other Visits  Medication Dose Route Frequency Provider Last Rate Last Admin   cyanocobalamin ((VITAMIN B-12)) injection 1,000 mcg  1,000 mcg Intramuscular Once Derek Jack, MD       cyanocobalamin ((VITAMIN B-12)) injection 1,000 mcg  1,000 mcg Intramuscular Once Derek Jack, MD        Review of Systems: GENERAL: negative for malaise, night sweats HEENT: No changes in hearing or vision, no nose bleeds or other nasal problems. NECK: Negative for lumps, goiter, pain and significant neck swelling RESPIRATORY: Negative for cough, wheezing CARDIOVASCULAR: Negative for chest pain, leg swelling, palpitations, orthopnea GI: SEE HPI MUSCULOSKELETAL: Negative for joint pain or swelling, back pain, and muscle pain. SKIN: Negative for lesions, rash PSYCH: Negative for sleep disturbance, mood disorder and recent psychosocial stressors. HEMATOLOGY Negative for prolonged bleeding, bruising easily, and swollen nodes. ENDOCRINE: Negative for cold or heat intolerance, polyuria, polydipsia and goiter. NEURO: negative for tremor, gait imbalance, syncope and seizures. The remainder of the review of systems is noncontributory.   Physical Exam: BP 140/78 (BP Location: Left Arm, Patient Position: Sitting, Cuff Size: Large)   Pulse 64   Temp (!) 97.3 F (36.3 C) (Oral)   Ht 6' 2" (1.88 m)   Wt 203 lb 4.8 oz (92.2 kg)    BMI 26.10 kg/m  GENERAL: The patient is AO x3, in no acute distress. HEENT: Head is normocephalic and atraumatic. EOMI are intact. Mouth is well hydrated and without lesions. NECK: Supple. No masses LUNGS: Clear to auscultation. No presence of rhonchi/wheezing/rales. Adequate chest expansion HEART: RRR, normal s1 and s2. ABDOMEN: Soft, nontender, no guarding, no peritoneal signs, and nondistended. BS +. No masses. EXTREMITIES: Without any cyanosis, clubbing, rash, lesions or edema. NEUROLOGIC: AOx3, no focal motor deficit. SKIN: no jaundice, no rashes  Imaging/Labs: as above  I personally reviewed and interpreted the available labs, imaging and endoscopic files.  Impression and Plan: Baruc Tugwell is a 60 y.o. 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  The patient has a history of chronic hepatitis C which was treated with Epclusa.  He reported he finished the course of the medication completely.  However, there was no documentation or achieving SVR.  I advised him that we will need to first obtain just to determine if  he is cured from hepatitis C.  He has not presented any signs of portal hypertension or decompensated liver disease but he had some evidence of F3 fibrosis and nodularity in his liver prior to receiving treatment for hepatitis C.  His most recent blood chemistries are not consistent with liver cirrhosis: However, given the presence of F3 fibrosis, he needs to undergo surveillance for [facility] every 6 months with ultrasound and AFP.  We will obtain a liver elastography at the moment to evaluate if his fibrosis has progressed to cirrhosis.  If so, we may need to perform an EGD due to thrombocytopenia (although this could be related to his lymphoma).  Finally, I advised the patient to proceed with a colonoscopy for  screening purposes as he has never had 1.  However, the patient will give [place] a call in the next few months after he has taking care of the acute problems he has regarding his new diagnosis of lymphoma.  - Check HCV RNA, CMP and AFP - Schedule liver elastography - Patient to call us back to schedule screening colonoscopy when possible  All questions were answered.      Harvel Quale, MD Gastroenterology and Hepatology [place] [facility]

## 2021-10-12 NOTE — Patient Instructions (Signed)
Perform blood workup Schedule liver elastography Please call us back to schedule your screening colonoscopy when possible

## 2021-10-17 NOTE — Progress Notes (Signed)
Logan Brady, [place] [id number]   CLINIC:  Medical Oncology/Hematology  PCP:  Celene Squibb, MD [address] [place] [address] [phone]   REASON FOR VISIT:  Follow-up for pancytopenia  PRIOR THERAPY: none  CURRENT THERAPY: Vitamin B-12 injection  INTERVAL HISTORY:  Logan Brady, a 60 y.o. male, returns for routine follow-up of his pancytopenia. Logan Brady was last seen on 04/24/2021.   Today he reports feeling good. He reports drinking sufficient daily fluids. He denies fevers, night sweats, and weight loss.   REVIEW OF SYSTEMS:  Review of Systems  Constitutional:  Negative for appetite change, fatigue (70%), fever and unexpected weight change.  Musculoskeletal:  Positive for back pain (6/10).  Psychiatric/Behavioral:  Positive for sleep disturbance.   All other systems reviewed and are negative.  PAST MEDICAL/SURGICAL HISTORY:  Past Medical History:  Diagnosis Date   Degeneration of lumbar intervertebral disc    Depression    Heart murmur    Hepatitis C    Hepatitis C 01/27/2019   Hypertension    Lumbar radiculopathy    Spinal stenosis of lumbar region    Spondylolysis    CHRONIC BILATERAL.  GRADE L5-S1.   Tobacco abuse    No past surgical history on file.  SOCIAL HISTORY:  Social History   Socioeconomic History   Marital status: Divorced    Spouse name: Not on file   Number of children: 4   Years of education: 12   Highest education level: High school graduate  Occupational History   Occupation: maintenance  Tobacco Use   Smoking status: Every Day    Packs/day: 1.00    Years: 40.00    Pack years: 40.00    Types: Cigarettes   Smokeless tobacco: Never  Vaping Use   Vaping Use: Never used  Substance and Sexual Activity   Alcohol use: Yes    Comment: a couple Q few weeks   Drug use: Not Currently    Comment: quit 30 years ago   Sexual activity: Yes  Other Topics Concern   Not on file  Social  History Narrative   Not on file   Social Determinants of Health   Financial Resource Strain: Not on file  Food Insecurity: Not on file  Transportation Needs: Not on file  Physical Activity: Not on file  Stress: Not on file  Social Connections: Not on file  Intimate Partner Violence: Not on file    FAMILY HISTORY:  Family History  Problem Relation Age of Onset   Heart attack Mother    Lung cancer Father     CURRENT MEDICATIONS:  Current Outpatient Medications  Medication Sig Dispense Refill   albuterol (VENTOLIN HFA) 108 (90 Base) MCG/ACT inhaler Inhale 1-2 puffs into the lungs every 4 (four) hours as needed for wheezing or shortness of breath.     Cyanocobalamin (B-12 COMPLIANCE INJECTION) 1000 MCG/ML KIT Inject as directed every 30 (thirty) days.     hydrocortisone cream 1 % Apply 1 application topically 4 (four) times daily as needed for itching.     neomycin-bacitracin-polymyxin (NEOSPORIN) ophthalmic ointment Place 1 application into both eyes 4 (four) times daily as needed (styes).     rizatriptan (MAXALT) 5 MG tablet Take 5 mg by mouth daily as needed for migraine.     sildenafil (VIAGRA) 100 MG tablet Take 100 mg by mouth daily as needed for erectile dysfunction.     tiZANidine (ZANAFLEX) 4 MG  tablet Take 4 mg by mouth every 6 (six) hours as needed for muscle spasms.  1   traMADol (ULTRAM) 50 MG tablet Take 50 mg by mouth every 6 (six) hours as needed for moderate pain.     No current facility-administered medications for this visit.   Facility-Administered Medications Ordered in Other Visits  Medication Dose Route Frequency Provider Last Rate Last Admin   cyanocobalamin ((VITAMIN B-12)) injection 1,000 mcg  1,000 mcg Intramuscular Once Derek Jack, MD       cyanocobalamin ((VITAMIN B-12)) injection 1,000 mcg  1,000 mcg Intramuscular Once Derek Jack, MD       cyanocobalamin ((VITAMIN B-12)) injection 1,000 mcg  1,000 mcg Intramuscular Once Derek Jack, MD        ALLERGIES:  Allergies  Allergen Reactions   Ibuprofen Hives and Itching   Acetaminophen Hives and Itching   Aspirin Hives and Itching   Penicillins     Turned red and felt "pins and needles" in his feet    PHYSICAL EXAM:  Performance status (ECOG): 0 - Asymptomatic  Vitals:   10/18/21 1001  BP: 135/82  Pulse: 61  Resp: 16  Temp: 98 F (36.7 C)  SpO2: 100%   Wt Readings from Last 3 Encounters:  10/18/21 195 lb 14.4 oz (88.9 kg)  10/12/21 203 lb 4.8 oz (92.2 kg)  09/19/21 199 lb 8 oz (90.5 kg)   Physical Exam Vitals reviewed.  Constitutional:      Appearance: Normal appearance.  Cardiovascular:     Rate and Rhythm: Normal rate and regular rhythm.     Pulses: Normal pulses.     Heart sounds: Normal heart sounds.  Pulmonary:     Effort: Pulmonary effort is normal.     Breath sounds: Normal breath sounds.  Abdominal:     Palpations: Abdomen is soft. There is no hepatomegaly, splenomegaly or mass.     Tenderness: There is no abdominal tenderness.  Neurological:     General: No focal deficit present.     Mental Status: He is alert and oriented to person, place, and time.  Psychiatric:        Mood and Affect: Mood normal.        Behavior: Behavior normal.     LABORATORY DATA:  I have reviewed the labs as listed.  CBC Latest Ref Rng & Units 10/18/2021 09/05/2021 08/01/2021  WBC 4.0 - 10.5 K/uL 2.6(L) 3.1(L) 2.6(L)  Hemoglobin 13.0 - 17.0 g/dL 9.9(L) 9.6(L) 10.2(L)  Hematocrit 39.0 - 52.0 % 29.1(L) 28.9(L) 30.4(L)  Platelets 150 - 400 K/uL 112(L) 100(L) 105(L)   CMP Latest Ref Rng & Units 10/18/2021 08/01/2021 08/01/2021  Glucose 70 - 99 mg/dL 110(H) 119(H) 104(H)  BUN 6 - 20 mg/dL 35(H) 30(H) 28(H)  Creatinine 0.61 - 1.24 mg/dL 1.29(H) 1.29(H) 1.35(H)  Sodium 135 - 145 mmol/L 137 137 137  Potassium 3.5 - 5.1 mmol/L 3.8 4.1 4.0  Chloride 98 - 111 mmol/L 105 105 104  CO2 22 - 32 mmol/L _0 Calcium 8.9 - 10.3 mg/dL 8.9 9.0 9.6  Total  Protein 6.5 - 8.1 g/dL 7.2 7.5 7.4  Total Bilirubin 0.3 - 1.2 mg/dL 0.3 0.6 0.6  Alkaline Phos 38 - 126 U/L 43 38 37(L)  AST 15 - 41 U/L _1 ALT 0 - 44 U/L _2 DIAGNOSTIC IMAGING:  I have independently reviewed the scans and discussed with the patient. NM PET Image Initial (PI) Skull Base  To Thigh  Result Date: 10/13/2021 CLINICAL DATA:  Initial treatment strategy for lymphoma. EXAM: NUCLEAR MEDICINE PET SKULL BASE TO THIGH TECHNIQUE: 10.3 mCi F-18 FDG was injected intravenously. Full-ring PET imaging was performed from the skull base to thigh after the radiotracer. CT data was obtained and used for attenuation correction and anatomic localization. Fasting blood glucose: 94 mg/dl COMPARISON:  None. FINDINGS: Mediastinal blood pool activity: SUV max 1.7 Liver activity: SUV max 2.89 NECK: No hypermetabolic lymph nodes in the neck. Incidental CT findings: none CHEST: No hypermetabolic mediastinal or hilar nodes. No suspicious pulmonary nodules on the CT scan. Incidental CT findings: Nodule within the periphery of the right lower lobe measures 6 mm and is unchanged from 01/20/2019. This is technically too small to characterize by PET-CT. Aortic atherosclerosis. Lad coronary artery calcifications ABDOMEN/PELVIS: No abnormal FDG uptake within the liver, pancreas, or adrenal glands. Mild diffuse increased uptake within the spleen above background liver activity is noted with SUV max of 3.16. The spleen is enlarged measuring 15 cm in cranial caudal dimension. No focal splenic lesions identified. Mild FDG uptake identified within porta hepatic lymph nodes with SUV max of 3.71. Partially calcified lymph node in the left inguinal region measures 1.2 cm within SUV max of 2.21, image 268/3. Incidental CT findings: Aortic atherosclerosis. SKELETON: Mild diffuse increased radiotracer uptake is identified within the visualized axial and proximal appendicular skeleton. SUV max within the left iliac bone is  equal to 2.3. SUV max within the bone marrow of the proximal right femur is equal to 3.05. Incidental CT findings: none IMPRESSION: 1. Splenomegaly. Mild diffuse uptake throughout the spleen is noted again within SUV max of 3.16. This is compared with background liver activity of 2.89. Findings may reflect splenic involvement by lymphoma. 2. Mild, nonspecific uptake is also identified throughout the bone marrow of the axial and proximal appendicular skeleton. The SUV max which is ranges between 3.05 and 2.3 around background liver activity 3. Mild uptake, above background liver activity, is noted within the porta hepatic region with SUV max of 3.71. 4. No adenopathy or mass identified within the neck, chest, abdomen or pelvis. 5.  Aortic Atherosclerosis (ICD10-I70.0). Electronically Signed   By: Kerby Moors M.D.   On: 10/13/2021 12:21     ASSESSMENT:  1.  Pancytopenia: -CBC on 04/23/2018 shows white count of 2.4, platelet count of 88 with an MCV of 107. -This was repeated on 05/01/2018 when the white count was 2.9, platelet count of 107 with a normal differential. -CTAP from 08/13/2018 showed fatty infiltration of the liver with mild splenomegaly.   2.  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   PLAN:  1.  Pancytopenia: - I have reviewed PET scan images with the patient. - It showed splenomegaly measuring 15 cm with increased uptake.  There is also diffuse uptake in the bones. - We reviewed bone marrow biopsy results which showed hypercellular marrow with immunophenotype highly suggestive of hairy cell leukemia.  Low-grade lymphoma FISH panel was negative.  Chromosome analysis was normal. - Bone marrow was also dry tap with increased fibrosis on the core biopsy, also highly suggestive of hairy cell leukemia. - I have called and talked to pathologist Dr. Gari Crown who has reviewed biopsy and smears. - It is recommended that we at least start out with flow cytometry on  the peripheral blood to confirm hairy cell leukemia markers including CD103, 11c and CD25. - We will also consider sending peripheral blood for  NGS panel to check for BRAF V600 mutation. - If the flow cytometry is not suggestive, it is worthwhile to repeat bone marrow aspiration and biopsy.  If aspirate is again negative, we will consider getting 3 cores, with 1 core in the flow cytometry tube, 1 core in formalin and the third core for cytogenetics and/or other additional studies. - He does have significant cytopenias with ANC less than 1000 and hemoglobin less than 11 which would be indication for treatment of hairy cell leukemia, if it is confirmed. - We will consider cladribine treatment daily for 5 days with or without rituximab.    2.  Macrocytic anemia: - Hemoglobin is 9.9 with MCV 105. - Nutritional deficiency work-up was negative.   Orders placed this encounter:  No orders of the defined types were placed in this encounter.    Derek Jack, MD [place] [phone]   I, Thana Ates, am acting as a scribe for Dr. Derek Jack.  I, Derek Jack MD, have reviewed the above documentation for accuracy and completeness, and I agree with the above.

## 2021-10-18 ENCOUNTER — Inpatient Hospital Stay (HOSPITAL_COMMUNITY): Payer: 59

## 2021-10-18 ENCOUNTER — Other Ambulatory Visit (HOSPITAL_COMMUNITY)
Admission: RE | Admit: 2021-10-18 | Discharge: 2021-10-18 | Disposition: A | Discharge status: Home / Self Care | Payer: 59 | Source: Ambulatory Visit | Attending: Gastroenterology | Admitting: Gastroenterology

## 2021-10-18 ENCOUNTER — Other Ambulatory Visit: Payer: Self-pay

## 2021-10-18 ENCOUNTER — Inpatient Hospital Stay (HOSPITAL_COMMUNITY): Payer: 59 | Attending: Hematology | Admitting: Hematology

## 2021-10-18 VITALS — BP 135/82 | HR 61 | Temp 98.0°F | Resp 16 | Ht 74.0 in | Wt 195.9 lb

## 2021-10-18 DIAGNOSIS — D7589 Other specified diseases of blood and blood-forming organs: Secondary | ICD-10-CM | POA: Insufficient documentation

## 2021-10-18 DIAGNOSIS — E538 Deficiency of other specified B group vitamins: Secondary | ICD-10-CM | POA: Diagnosis not present

## 2021-10-18 DIAGNOSIS — D61818 Other pancytopenia: Secondary | ICD-10-CM

## 2021-10-18 DIAGNOSIS — F17210 Nicotine dependence, cigarettes, uncomplicated: Secondary | ICD-10-CM | POA: Diagnosis not present

## 2021-10-18 DIAGNOSIS — R932 Abnormal findings on diagnostic imaging of liver and biliary tract: Secondary | ICD-10-CM | POA: Diagnosis present

## 2021-10-18 DIAGNOSIS — K7400 Hepatic fibrosis, unspecified: Secondary | ICD-10-CM | POA: Diagnosis present

## 2021-10-18 DIAGNOSIS — Z801 Family history of malignant neoplasm of trachea, bronchus and lung: Secondary | ICD-10-CM | POA: Insufficient documentation

## 2021-10-18 DIAGNOSIS — Z8619 Personal history of other infectious and parasitic diseases: Secondary | ICD-10-CM | POA: Diagnosis present

## 2021-10-18 DIAGNOSIS — I10 Essential (primary) hypertension: Secondary | ICD-10-CM | POA: Diagnosis not present

## 2021-10-18 DIAGNOSIS — D539 Nutritional anemia, unspecified: Secondary | ICD-10-CM | POA: Diagnosis not present

## 2021-10-18 DIAGNOSIS — R161 Splenomegaly, not elsewhere classified: Secondary | ICD-10-CM | POA: Insufficient documentation

## 2021-10-18 DIAGNOSIS — C8590 Non-Hodgkin lymphoma, unspecified, unspecified site: Secondary | ICD-10-CM

## 2021-10-18 LAB — COMPREHENSIVE METABOLIC PANEL
ALT: 11 U/L (ref 0–44)
AST: 16 U/L (ref 15–41)
Albumin: 4.1 g/dL (ref 3.5–5.0)
Alkaline Phosphatase: 43 U/L (ref 38–126)
Anion gap: 7 (ref 5–15)
BUN: 35 mg/dL — ABNORMAL HIGH (ref 6–20)
CO2: 25 mmol/L (ref 22–32)
Calcium: 8.9 mg/dL (ref 8.9–10.3)
Chloride: 105 mmol/L (ref 98–111)
Creatinine, Ser: 1.29 mg/dL — ABNORMAL HIGH (ref 0.61–1.24)
GFR, Estimated: >60 mL/min (ref >60)
Glucose, Bld: 110 mg/dL — ABNORMAL HIGH (ref 70–99)
Potassium: 3.8 mmol/L (ref 3.5–5.1)
Sodium: 137 mmol/L (ref 135–145)
Total Bilirubin: 0.3 mg/dL (ref 0.3–1.2)
Total Protein: 7.2 g/dL (ref 6.5–8.1)

## 2021-10-18 LAB — CBC WITH DIFFERENTIAL/PLATELET
Abs Immature Granulocytes: 0.01 10*3/uL (ref 0.00–0.07)
Basophils Absolute: 0 10*3/uL (ref 0.0–0.1)
Basophils Relative: 0 %
Eosinophils Absolute: 0.1 10*3/uL (ref 0.0–0.5)
Eosinophils Relative: 3 %
HCT: 29.1 % — ABNORMAL LOW (ref 39.0–52.0)
Hemoglobin: 9.9 g/dL — ABNORMAL LOW (ref 13.0–17.0)
Immature Granulocytes: 0 %
Lymphocytes Relative: 66 %
Lymphs Abs: 1.7 10*3/uL (ref 0.7–4.0)
MCH: 35.7 pg — ABNORMAL HIGH (ref 26.0–34.0)
MCHC: 34 g/dL (ref 30.0–36.0)
MCV: 105.1 fL — ABNORMAL HIGH (ref 80.0–100.0)
Monocytes Absolute: 0 10*3/uL — ABNORMAL LOW (ref 0.1–1.0)
Monocytes Relative: 1 %
Neutro Abs: 0.8 10*3/uL — ABNORMAL LOW (ref 1.7–7.7)
Neutrophils Relative %: 30 %
Platelets: 112 10*3/uL — ABNORMAL LOW (ref 150–400)
RBC: 2.77 MIL/uL — ABNORMAL LOW (ref 4.22–5.81)
RDW: 16 % — ABNORMAL HIGH (ref 11.5–15.5)
WBC: 2.6 10*3/uL — ABNORMAL LOW (ref 4.0–10.5)
nRBC: 0 % (ref 0.0–0.2)

## 2021-10-18 LAB — LACTATE DEHYDROGENASE: LDH: 118 U/L (ref 98–192)

## 2021-10-18 MED ORDER — CYANOCOBALAMIN 1000 MCG/ML IJ SOLN
1000.0000 ug | Freq: Once | INTRAMUSCULAR | Status: AC
  Administered 2021-10-18: 1000 ug via INTRAMUSCULAR
  Filled 2021-10-18: qty 1

## 2021-10-18 NOTE — Patient Instructions (Signed)
[facility]  Discharge Instructions: Thank you for choosing [facility] to provide your oncology and hematology care.  If you have a lab appointment with the Cancer Center, please come in thru the Main Entrance and check in at the main information desk.  Wear comfortable clothing and clothing appropriate for easy access to any Portacath or PICC line.   We strive to give you quality time with your provider. You may need to reschedule your appointment if you arrive late (15 or more minutes).  Arriving late affects you and other patients whose appointments are after yours.  Also, if you miss three or more appointments without notifying the office, you may be dismissed from the clinic at the provider's discretion.      For prescription refill requests, have your pharmacy contact our office and allow 72 hours for refills to be completed.    Today you received the following chemotherapy and/or immunotherapy agents B12      To help prevent nausea and vomiting after your treatment, we encourage you to take your nausea medication as directed.  BELOW ARE SYMPTOMS THAT SHOULD BE REPORTED IMMEDIATELY: *FEVER GREATER THAN 100.4 F (38 C) OR HIGHER *CHILLS OR SWEATING *NAUSEA AND VOMITING THAT IS NOT CONTROLLED WITH YOUR NAUSEA MEDICATION *UNUSUAL SHORTNESS OF BREATH *UNUSUAL BRUISING OR BLEEDING *URINARY PROBLEMS (pain or burning when urinating, or frequent urination) *BOWEL PROBLEMS (unusual diarrhea, constipation, pain near the anus) TENDERNESS IN MOUTH AND THROAT WITH OR WITHOUT PRESENCE OF ULCERS (sore throat, sores in mouth, or a toothache) UNUSUAL RASH, SWELLING OR PAIN  UNUSUAL VAGINAL DISCHARGE OR ITCHING   Items with * indicate a potential emergency and should be followed up as soon as possible or go to the Emergency Department if any problems should occur.  Please show the CHEMOTHERAPY ALERT CARD or IMMUNOTHERAPY ALERT CARD at check-in to the Emergency Department  and triage nurse.  Should you have questions after your visit or need to cancel or reschedule your appointment, please contact  CANCER CENTER [phone]  and follow the prompts.  Office hours are 8:00 a.m. to 4:30 p.m. Monday - Friday. Please note that voicemails left after 4:00 p.m. may not be returned until the following business day.  We are closed weekends and major holidays. You have access to a nurse at all times for urgent questions. Please call the main number to the clinic [phone] and follow the prompts.  For any non-urgent questions, you may also contact your provider using MyChart. We now offer e-Visits for anyone 18 and older to request care online for non-urgent symptoms. For details visit [email or web address].   Also download the MyChart app! Go to the app store, search "MyChart", open the app, select [facility], and log in with your MyChart username and password.  Due to Covid, a mask is required upon entering the hospital/clinic. If you do not have a mask, one will be given to you upon arrival. For doctor visits, patients may have 1 support person aged 18 or older with them. For treatment visits, patients cannot have anyone with them due to current Covid guidelines and our immunocompromised population.  

## 2021-10-18 NOTE — Progress Notes (Signed)
Logan Brady presents today for injection per the provider's orders.  B12 in left deltoid administration without incident; injection site WNL; see MAR for injection details.  Patient tolerated procedure well and without incident.  No questions or complaints noted at this time. Stable during and after injection.  AVS reviewed.  Vital signs stable.  Discharged in stable condition ambulatory.

## 2021-10-18 NOTE — Patient Instructions (Signed)
[place] at [facility] Discharge Instructions   You were seen and examined today by Dr. Delton Coombes.  He reviewed the results of you lab work and PET scan.   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 Raliegh Ip will confirm with pathologist. If diagnosis is confirmed, we will need to start you on treatment for this.  It will be a pill that you take.   We will see you after confirming the diagnosis with pathologist to start treatment.    Thank you for choosing [place] [facility] to provide your oncology and hematology care.  To afford each patient quality time with our provider, please arrive at least 15 minutes before your scheduled appointment time.   If you have a lab appointment with the [facility] please come in thru the Main Entrance and check in at the main information desk.  You need to re-schedule your appointment should you arrive 10 or more minutes late.  We strive to give you quality time with our providers, and arriving late affects you and other patients whose appointments are after yours.  Also, if you no show three or more times for appointments you may be dismissed from the clinic at the providers discretion.     Again, thank you for choosing [facility].  Our hope is that these requests will decrease the amount of time that you wait before being seen by our physicians.       _____________________________________________________________  Should you have questions after your visit to [facility], please contact our office at [phone] and follow the prompts.  Our office hours are 8:00 a.m. and 4:30 p.m. Monday - Friday.  Please note that voicemails left after 4:00 p.m. may not be returned until the following business day.  We are closed weekends and major holidays.  You do have access to a nurse 24-7, just call the main number to the clinic [phone] and do not press any options, hold on the  line and a nurse will answer the phone.    For prescription refill requests, have your pharmacy contact our office and allow 72 hours.    Due to Covid, you will need to wear a mask upon entering the hospital. If you do not have a mask, a mask will be given to you at the Main Entrance upon arrival. For doctor visits, patients may have 1 support person age 76 or older with them. For treatment visits, patients can not have anyone with them due to social distancing guidelines and our immunocompromised population.

## 2021-10-19 ENCOUNTER — Ambulatory Visit (HOSPITAL_COMMUNITY): Payer: 59

## 2021-10-19 LAB — AFP TUMOR MARKER: AFP, Serum, Tumor Marker: 2.4 ng/mL (ref 0.0–8.4)

## 2021-10-19 LAB — HCV RNA QUANT: HCV Quantitative: NOT DETECTED IU/mL (ref >50)

## 2021-10-24 ENCOUNTER — Encounter (INDEPENDENT_AMBULATORY_CARE_PROVIDER_SITE_OTHER): Payer: Self-pay

## 2021-10-26 ENCOUNTER — Other Ambulatory Visit: Payer: Self-pay

## 2021-10-26 ENCOUNTER — Ambulatory Visit (HOSPITAL_COMMUNITY)
Admission: RE | Admit: 2021-10-26 | Discharge: 2021-10-26 | Disposition: A | Discharge status: Home / Self Care | Payer: 59 | Source: Ambulatory Visit | Attending: Gastroenterology | Admitting: Gastroenterology

## 2021-10-26 DIAGNOSIS — Z8619 Personal history of other infectious and parasitic diseases: Secondary | ICD-10-CM | POA: Insufficient documentation

## 2021-10-26 DIAGNOSIS — R932 Abnormal findings on diagnostic imaging of liver and biliary tract: Secondary | ICD-10-CM | POA: Insufficient documentation

## 2021-10-26 DIAGNOSIS — K7400 Hepatic fibrosis, unspecified: Secondary | ICD-10-CM | POA: Insufficient documentation

## 2021-10-30 NOTE — Progress Notes (Signed)
10/24/2021: I mailed a letter to the patient on 10/24/2021 due to him not picking up the phone when we call. I asked that he please return call to the office.

## 2021-10-31 NOTE — Progress Notes (Signed)
° °  Patient not responding to calls or letter mailed to him. Please advise.   Karle Barr, [place]  10/24/2021  9:15 AM EST Back to Top    Mailed letter to the patient asking him to return call to the office.    Karle Barr, CMA  10/24/2021  9:11 AM EST     Tried calling vm full.    [place]  10/23/2021  8:15 AM EST     I called and left a message to return call.    Karle Barr, [place]  10/20/2021  8:20 AM EST     I called and left a message asked that the patient return call.    Harvel Quale, MD  10/20/2021  7:40 AM EST     Hi Celeste Candelas,   Can you please call the patient and tell the patient he is officially cured from hepatitis C?    Thanks,   Maylon Peppers, MD Gastroenterology and Hepatology [facility]

## 2021-11-09 ENCOUNTER — Encounter (HOSPITAL_COMMUNITY): Payer: Self-pay | Admitting: Hematology

## 2021-11-10 ENCOUNTER — Encounter (HOSPITAL_COMMUNITY): Payer: Self-pay | Admitting: Hematology

## 2021-11-13 ENCOUNTER — Encounter (HOSPITAL_COMMUNITY): Payer: Self-pay | Admitting: Hematology

## 2021-11-15 ENCOUNTER — Other Ambulatory Visit (HOSPITAL_COMMUNITY): Payer: Self-pay | Admitting: *Deleted

## 2021-11-15 DIAGNOSIS — C8590 Non-Hodgkin lymphoma, unspecified, unspecified site: Secondary | ICD-10-CM

## 2021-11-20 ENCOUNTER — Inpatient Hospital Stay (HOSPITAL_COMMUNITY): Payer: Self-pay | Attending: Hematology

## 2022-04-03 ENCOUNTER — Encounter (INDEPENDENT_AMBULATORY_CARE_PROVIDER_SITE_OTHER): Payer: Self-pay | Admitting: *Deleted

## 2022-04-13 ENCOUNTER — Other Ambulatory Visit (INDEPENDENT_AMBULATORY_CARE_PROVIDER_SITE_OTHER): Payer: Self-pay

## 2022-04-13 ENCOUNTER — Encounter (INDEPENDENT_AMBULATORY_CARE_PROVIDER_SITE_OTHER): Payer: Self-pay

## 2022-04-13 DIAGNOSIS — Z8619 Personal history of other infectious and parasitic diseases: Secondary | ICD-10-CM

## 2022-04-13 DIAGNOSIS — R932 Abnormal findings on diagnostic imaging of liver and biliary tract: Secondary | ICD-10-CM

## 2022-04-13 DIAGNOSIS — K7400 Hepatic fibrosis, unspecified: Secondary | ICD-10-CM

## 2022-05-16 ENCOUNTER — Telehealth (INDEPENDENT_AMBULATORY_CARE_PROVIDER_SITE_OTHER): Payer: Self-pay

## 2022-05-16 NOTE — Telephone Encounter (Signed)
Thanks

## 2022-05-16 NOTE — Telephone Encounter (Signed)
05/01/2022: I left a detailed message on vm asked that he please have labs drawn if not already done. Mailed orders and letter asking patient to have labs drawn around 04/27/2022.

## 2024-04-03 ENCOUNTER — Encounter (HOSPITAL_COMMUNITY): Payer: Self-pay | Admitting: Hematology

## 2024-04-09 ENCOUNTER — Inpatient Hospital Stay

## 2024-04-09 ENCOUNTER — Encounter (HOSPITAL_COMMUNITY): Payer: Self-pay | Admitting: Hematology

## 2024-04-09 ENCOUNTER — Inpatient Hospital Stay: Payer: Self-pay | Attending: Hematology | Admitting: Hematology

## 2024-04-09 VITALS — BP 166/93 | HR 62 | Temp 97.6°F | Resp 18 | Ht 74.0 in | Wt 202.0 lb

## 2024-04-09 DIAGNOSIS — D61818 Other pancytopenia: Secondary | ICD-10-CM | POA: Diagnosis present

## 2024-04-09 DIAGNOSIS — B1920 Unspecified viral hepatitis C without hepatic coma: Secondary | ICD-10-CM | POA: Insufficient documentation

## 2024-04-09 DIAGNOSIS — K760 Fatty (change of) liver, not elsewhere classified: Secondary | ICD-10-CM | POA: Diagnosis not present

## 2024-04-09 DIAGNOSIS — F17210 Nicotine dependence, cigarettes, uncomplicated: Secondary | ICD-10-CM | POA: Diagnosis not present

## 2024-04-09 DIAGNOSIS — D539 Nutritional anemia, unspecified: Secondary | ICD-10-CM | POA: Diagnosis not present

## 2024-04-09 DIAGNOSIS — Z801 Family history of malignant neoplasm of trachea, bronchus and lung: Secondary | ICD-10-CM | POA: Insufficient documentation

## 2024-04-09 DIAGNOSIS — N189 Chronic kidney disease, unspecified: Secondary | ICD-10-CM | POA: Diagnosis not present

## 2024-04-09 DIAGNOSIS — R161 Splenomegaly, not elsewhere classified: Secondary | ICD-10-CM | POA: Insufficient documentation

## 2024-04-09 LAB — RETICULOCYTES
Immature Retic Fract: 24.5 % — ABNORMAL HIGH (ref 2.3–15.9)
RBC.: 2.54 MIL/uL — ABNORMAL LOW (ref 4.22–5.81)
Retic Count, Absolute: 49 10*3/uL (ref 19.0–186.0)
Retic Ct Pct: 1.9 % (ref 0.4–3.1)

## 2024-04-09 LAB — CBC WITH DIFFERENTIAL/PLATELET
Abs Immature Granulocytes: 0.01 10*3/uL (ref 0.00–0.07)
Basophils Absolute: 0 10*3/uL (ref 0.0–0.1)
Basophils Relative: 1 %
Eosinophils Absolute: 0.1 10*3/uL (ref 0.0–0.5)
Eosinophils Relative: 3 %
HCT: 27.4 % — ABNORMAL LOW (ref 39.0–52.0)
Hemoglobin: 8.9 g/dL — ABNORMAL LOW (ref 13.0–17.0)
Immature Granulocytes: 1 %
Lymphocytes Relative: 50 %
Lymphs Abs: 1.1 10*3/uL (ref 0.7–4.0)
MCH: 34.8 pg — ABNORMAL HIGH (ref 26.0–34.0)
MCHC: 32.5 g/dL (ref 30.0–36.0)
MCV: 107 fL — ABNORMAL HIGH (ref 80.0–100.0)
Monocytes Absolute: 0 10*3/uL — ABNORMAL LOW (ref 0.1–1.0)
Monocytes Relative: 1 %
Neutro Abs: 0.9 10*3/uL — ABNORMAL LOW (ref 1.7–7.7)
Neutrophils Relative %: 44 %
Platelets: 100 10*3/uL — ABNORMAL LOW (ref 150–400)
RBC: 2.56 MIL/uL — ABNORMAL LOW (ref 4.22–5.81)
RDW: 16.3 % — ABNORMAL HIGH (ref 11.5–15.5)
WBC: 2.1 10*3/uL — ABNORMAL LOW (ref 4.0–10.5)
nRBC: 0 % (ref 0.0–0.2)

## 2024-04-09 LAB — FOLATE: Folate: 9.9 ng/mL (ref >5.9)

## 2024-04-09 LAB — IRON AND TIBC
Iron: 66 ug/dL (ref 45–182)
Saturation Ratios: 24 % (ref 17.9–39.5)
TIBC: 279 ug/dL (ref 250–450)
UIBC: 213 ug/dL

## 2024-04-09 LAB — FERRITIN: Ferritin: 78 ng/mL (ref 24–336)

## 2024-04-09 LAB — DIRECT ANTIGLOBULIN TEST (NOT AT ARMC)
DAT, IgG: NEGATIVE
DAT, complement: NEGATIVE

## 2024-04-09 LAB — LACTATE DEHYDROGENASE: LDH: 116 U/L (ref 98–192)

## 2024-04-09 LAB — VITAMIN B12: Vitamin B-12: 237 pg/mL (ref 180–914)

## 2024-04-09 NOTE — Progress Notes (Signed)
 [facility] [address], [place] [address]    Clinic Day:  04/09/2024  Referring physician: Omie Bickers, MD  Patient Care Team: Omie Bickers, MD as PCP - General (Internal Medicine)   ASSESSMENT & PLAN:   Assessment: 1.  Pancytopenia: -CBC on 04/23/2018 shows white count of 2.4, platelet count of 88 with an MCV of 107. -This was repeated on 05/01/2018 when the white count was 2.9, platelet count of 107 with a normal differential. -CTAP from 08/13/2018 showed fatty infiltration of the liver with mild splenomegaly. - PET scan (10/12/2021): Splenomegaly 15 cm with increased uptake.  There is also diffuse uptake in the bones. 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  Low-grade lymphoma FISH panel was negative.  Chromosome analysis normal.  Bone marrow was also dry tap with increased fibrosis on the core biopsy, also highly suggestive of hairy cell leukemia.  I have called and talked to Dr. Eino Gravel.  Differential also includes mantle cell lymphoma. - He was subsequently lost to follow-up due to lack of insurance.   2.  Hepatitis C: - He had hep C genotype 3.  He was treated with Epclusa.  3. Social/Family History: -Is on disability leave, but works on his house. Tobacco use of 0.5 to 1 ppd for 40 years. ETOH use of 3 alcoholic beverages a week. -Father had lung cancer. No family history of lymphoma.   Plan: 1.  Pancytopenia: - He was last seen by me in 2022 and was lost to follow-up as he did not have insurance.  Now he has insurance and wants to resume workup and treatment if needed. - In the last 2 to 3 years, he denied any fevers, night sweats or weight loss.  Denies any recurrent infections.  No new onset pains reported. - His most recent CBC on 03/27/2024 with Dr. Quentin Brunner office showed white count 2.5, hemoglobin 8.9, MCV 106, PLT-104.  ANC was 0.7. - I have recommended repeating CBC and do nutritional  deficiency workup, hemolysis workup and bone marrow infiltrative process workup with multiple myeloma labs. - Recommend flow cytometry with leukemia lymphoma monitoring panel with markers including CD103, CD11c, CD25 for hairy cell leukemia. - I will send myeloid NGS panel to check for BRAF V600 mutation. - I have also recommended repeating bone marrow aspiration and biopsy.  If the aspirate is again negative, we will request getting 3 course, 1 core for flow cytometry, 1 core in the formalin and the third core for cytogenetics/other additional studies. - If hairy cell leukemia confirmed, he does have indications for treatment including ANC less than thousand, hemoglobin less than 11.  Will consider cladribine daily for 5 days with or without rituximab.    2.  Macrocytic anemia: - He also has CKD.  Will do nutritional deficiency workup and hemolysis workup today.  No orders of the defined types were placed in this encounter.     Nadeen Augusta Teague,acting as a Neurosurgeon for Paulett Boros, MD.,have documented all relevant documentation on the behalf of Paulett Boros, MD,as directed by  Paulett Boros, MD while in the presence of Paulett Boros, MD.   I, Paulett Boros MD, have reviewed the above documentation for accuracy and completeness, and I agree with the above.   [place]   5/29/20257:46 AM  CHIEF COMPLAINT:   Diagnosis: Pancytopenia  Prior Therapy: None  Current Therapy:  Vitamin B-12 injection    HISTORY OF  PRESENT ILLNESS:   Oncology History   No history exists.     INTERVAL HISTORY:   Logan Brady is a 63 y.o. male presenting to clinic today for follow up of pancytopenia. He was last seen by me on 10/18/21.  Since his last visit, he has been treated for Hepatitis C by Dr. Del Favia and pancytopenia remains persistent, in terms of his leukemia, anemia, and thrombocytopenia. CBC diff from 03/27/24 show low WBC at 2.5, low RBC at 2.50, low HGB at 8.9,  low HCT at 27.5, elevated MCV at 106, elevated MCH at 34.4, elevated RDW at 16.2, low platelets at 104, low absolute neutrophils at 0.7, and low absolute monocytes at 0.0. Other labs from 03/27/24 show normal Vitamin B 12 at 270, normal folic acid  at 7.9.   Logan Brady has also gotten health insurance (Medicare).  Today, he states that he is doing well overall. His appetite level is at 100%. His energy level is at 100%. Logan Brady reports a dermal infection on the nose requiring antibiotics and denies any other infections in the last 3 years. He denies any fatigue, unintentional weight loss, fevers, or night sweats. He does not work, as he is on disability. Logan Brady denies any abdominal pain or early satiety.   PAST MEDICAL HISTORY:   Past Medical History: Past Medical History:  Diagnosis Date   Degeneration of lumbar intervertebral disc    Depression    Heart murmur    Hepatitis C    Hepatitis C 01/27/2019   Hypertension    Lumbar radiculopathy    Spinal stenosis of lumbar region    Spondylolysis    CHRONIC BILATERAL.  GRADE L5-S1.   Tobacco abuse     Surgical History: No past surgical history on file.  Social History: Social History   Socioeconomic History   Marital status: Divorced    Spouse name: Not on file   Number of children: 4   Years of education: 12   Highest education level: High school graduate  Occupational History   Occupation: maintenance  Tobacco Use   Smoking status: Every Day    Current packs/day: 1.00    Average packs/day: 1 pack/day for 40.0 years (40.0 ttl pk-yrs)    Types: Cigarettes   Smokeless tobacco: Never  Vaping Use   Vaping status: Never Used  Substance and Sexual Activity   Alcohol use: Yes    Comment: a couple Q few weeks   Drug use: Not Currently    Comment: quit 30 years ago   Sexual activity: Yes  Other Topics Concern   Not on file  Social History Narrative   Not on file   Social Drivers of Health   Financial Resource Strain: Low Risk   (05/07/2018)   Overall Financial Resource Strain (CARDIA)    Difficulty of Paying Living Expenses: Not hard at all  Food Insecurity: No Food Insecurity (05/07/2018)   Hunger Vital Sign    Worried About Running Out of Food in the Last Year: Never true    Ran Out of Food in the Last Year: Never true  Transportation Needs: No Transportation Needs (05/07/2018)   PRAPARE - Administrator, Civil Service (Medical): No    Lack of Transportation (Non-Medical): No  Physical Activity: Insufficiently Active (05/07/2018)   Exercise Vital Sign    Days of Exercise per Week: 2 days    Minutes of Exercise per Session: 30 min  Stress: No Stress Concern Present (05/07/2018)   [employer] of Occupational Health -  Occupational Stress Questionnaire    Feeling of Stress : Not at all  Social Connections: Moderately Isolated (05/07/2018)   Social Connection and Isolation Panel [NHANES]    Frequency of Communication with Friends and Family: More than three times a week    Frequency of Social Gatherings with Friends and Family: More than three times a week    Attends Religious Services: Never    Database administrator or Organizations: No    Attends Banker Meetings: Never    Marital Status: Divorced  Catering manager Violence: Not At Risk (05/07/2018)   Humiliation, Afraid, Rape, and Kick questionnaire    Fear of Current or Ex-Partner: No    Emotionally Abused: No    Physically Abused: No    Sexually Abused: No    Family History: Family History  Problem Relation Age of Onset   Heart attack Mother    Lung cancer Father     Current Medications:  Current Outpatient Medications:    albuterol (VENTOLIN HFA) 108 (90 Base) MCG/ACT inhaler, Inhale 1-2 puffs into the lungs every 4 (four) hours as needed for wheezing or shortness of breath., Disp: , Rfl:    Cyanocobalamin  (B-12 COMPLIANCE INJECTION) 1000 MCG/ML KIT, Inject as directed every 30 (thirty) days., Disp: , Rfl:     hydrocortisone cream 1 %, Apply 1 application topically 4 (four) times daily as needed for itching., Disp: , Rfl:    neomycin-bacitracin-polymyxin (NEOSPORIN) ophthalmic ointment, Place 1 application into both eyes 4 (four) times daily as needed (styes)., Disp: , Rfl:    rizatriptan (MAXALT) 5 MG tablet, Take 5 mg by mouth daily as needed for migraine., Disp: , Rfl:    sildenafil (VIAGRA) 100 MG tablet, Take 100 mg by mouth daily as needed for erectile dysfunction., Disp: , Rfl:    tiZANidine (ZANAFLEX) 4 MG tablet, Take 4 mg by mouth every 6 (six) hours as needed for muscle spasms., Disp: , Rfl: 1   traMADol (ULTRAM) 50 MG tablet, Take 50 mg by mouth every 6 (six) hours as needed for moderate pain., Disp: , Rfl:  No current facility-administered medications for this visit.  Facility-Administered Medications Ordered in Other Visits:    cyanocobalamin  ((VITAMIN B-12)) injection 1,000 mcg, 1,000 mcg, Intramuscular, Once, Paulett Boros, MD   cyanocobalamin  ((VITAMIN B-12)) injection 1,000 mcg, 1,000 mcg, Intramuscular, Once, Paulett Boros, MD   Allergies: Allergies  Allergen Reactions   Ibuprofen Hives and Itching   Acetaminophen Hives and Itching   Aspirin Hives and Itching   Penicillins     Turned red and felt "pins and needles" in his feet    REVIEW OF SYSTEMS:   Review of Systems  Constitutional:  Negative for chills, fatigue and fever.  HENT:   Negative for lump/mass, mouth sores, nosebleeds, sore throat and trouble swallowing.   Eyes:  Negative for eye problems.  Respiratory:  Negative for cough and shortness of breath.   Cardiovascular:  Negative for chest pain, leg swelling and palpitations.  Gastrointestinal:  Negative for abdominal pain, constipation, diarrhea, nausea and vomiting.  Genitourinary:  Negative for bladder incontinence, difficulty urinating, dysuria, frequency, hematuria and nocturia.   Musculoskeletal:  Positive for back pain. Negative for  arthralgias, flank pain, myalgias and neck pain.  Skin:  Negative for itching and rash.  Neurological:  Negative for dizziness, headaches and numbness.  Hematological:  Does not bruise/bleed easily.  Psychiatric/Behavioral:  Negative for depression, sleep disturbance and suicidal ideas. The patient is not nervous/anxious.   All  other systems reviewed and are negative.    VITALS:   There were no vitals taken for this visit.  Wt Readings from Last 3 Encounters:  10/18/21 195 lb 14.4 oz (88.9 kg)  10/12/21 203 lb 4.8 oz (92.2 kg)  09/19/21 199 lb 8 oz (90.5 kg)    There is no height or weight on file to calculate BMI.  Performance status (ECOG): 1 - Symptomatic but completely ambulatory  PHYSICAL EXAM:   Physical Exam Vitals and nursing note reviewed. Exam conducted with a chaperone present.  Constitutional:      Appearance: Normal appearance.  Cardiovascular:     Rate and Rhythm: Normal rate and regular rhythm.     Pulses: Normal pulses.     Heart sounds: Normal heart sounds.  Pulmonary:     Effort: Pulmonary effort is normal.     Breath sounds: Normal breath sounds.  Abdominal:     Palpations: Abdomen is soft. There is no hepatomegaly, splenomegaly or mass.     Tenderness: There is no abdominal tenderness.  Musculoskeletal:     Right lower leg: No edema.     Left lower leg: No edema.  Lymphadenopathy:     Cervical: No cervical adenopathy.     Right cervical: No superficial, deep or posterior cervical adenopathy.    Left cervical: No superficial, deep or posterior cervical adenopathy.     Upper Body:     Right upper body: No supraclavicular or axillary adenopathy.     Left upper body: No supraclavicular or axillary adenopathy.  Neurological:     General: No focal deficit present.     Mental Status: He is alert and oriented to person, place, and time.  Psychiatric:        Mood and Affect: Mood normal.        Behavior: Behavior normal.     LABS:   CBC      Component Value Date/Time   WBC 2.6 (L) 10/18/2021 0929   RBC 2.77 (L) 10/18/2021 0929   HGB 9.9 (L) 10/18/2021 0929   HCT 29.1 (L) 10/18/2021 0929   PLT 112 (L) 10/18/2021 0929   MCV 105.1 (H) 10/18/2021 0929   MCH 35.7 (H) 10/18/2021 0929   MCHC 34.0 10/18/2021 0929   RDW 16.0 (H) 10/18/2021 0929   LYMPHSABS 1.7 10/18/2021 0929   MONOABS 0.0 (L) 10/18/2021 0929   EOSABS 0.1 10/18/2021 0929   BASOSABS 0.0 10/18/2021 0929    CMP      Component Value Date/Time   NA 137 10/18/2021 0929   K 3.8 10/18/2021 0929   CL 105 10/18/2021 0929   CO2 25 10/18/2021 0929   GLUCOSE 110 (H) 10/18/2021 0929   BUN 35 (H) 10/18/2021 0929   CREATININE 1.29 (H) 10/18/2021 0929   CALCIUM 8.9 10/18/2021 0929   PROT 7.2 10/18/2021 0929   ALBUMIN 4.1 10/18/2021 0929   AST 16 10/18/2021 0929   ALT 11 10/18/2021 0929   ALKPHOS 43 10/18/2021 0929   BILITOT 0.3 10/18/2021 0929   GFRNONAA >60 10/18/2021 0929   GFRAA >60 01/19/2019 2141     No results found for: "CEA1", "CEA" / No results found for: "CEA1", "CEA" No results found for: "PSA1" No results found for: "CAN199" No results found for: "CAN125"  Lab Results  Component Value Date   TOTALPROTELP 7.0 08/07/2021   ALBUMINELP 4.1 08/01/2021   A1GS 0.2 08/01/2021   A2GS 0.8 08/01/2021   BETS 0.8 08/01/2021   GAMS 1.2 08/01/2021  MSPIKE Not Observed 08/01/2021   SPEI Comment 08/01/2021   Lab Results  Component Value Date   TIBC 311 08/07/2021   FERRITIN 56 08/07/2021   IRONPCTSAT 30 08/07/2021   Lab Results  Component Value Date   LDH 118 10/18/2021   LDH 106 08/25/2020   LDH 130 08/14/2018     STUDIES:   No results found.

## 2024-04-09 NOTE — Patient Instructions (Signed)
 You were seen and examined today by Dr. Cheree Cords. Dr. Cheree Cords is a hematologist, meaning that he specializes in blood abnormalities. Dr. Cheree Cords discussed your past medical history, family history of cancers/blood conditions and the events that led to you being here today.  You were referred to Dr. Cheree Cords due to low blood counts.  Dr. Katragadda has recommended additional labs today for further evaluation. He also recommends repeating a bone marrow biopsy as your counts are lower than previous.   Follow-up as scheduled.

## 2024-04-10 LAB — KAPPA/LAMBDA LIGHT CHAINS
Kappa free light chain: 57.5 mg/L — ABNORMAL HIGH (ref 3.3–19.4)
Kappa, lambda light chain ratio: 1.77 — ABNORMAL HIGH (ref 0.26–1.65)
Lambda free light chains: 32.5 mg/L — ABNORMAL HIGH (ref 5.7–26.3)

## 2024-04-10 LAB — COPPER, SERUM: Copper: 158 ug/dL — ABNORMAL HIGH (ref 69–132)

## 2024-04-10 LAB — BETA 2 MICROGLOBULIN, SERUM: Beta-2 Microglobulin: 2.7 mg/L — ABNORMAL HIGH (ref 0.6–2.4)

## 2024-04-10 LAB — SURGICAL PATHOLOGY

## 2024-04-12 LAB — METHYLMALONIC ACID, SERUM: Methylmalonic Acid, Quantitative: 205 nmol/L (ref 0–378)

## 2024-04-13 LAB — PROTEIN ELECTROPHORESIS, SERUM
A/G Ratio: 1 (ref 0.7–1.7)
Albumin ELP: 3.5 g/dL (ref 2.9–4.4)
Alpha-1-Globulin: 0.3 g/dL (ref 0.0–0.4)
Alpha-2-Globulin: 0.9 g/dL (ref 0.4–1.0)
Beta Globulin: 0.9 g/dL (ref 0.7–1.3)
Gamma Globulin: 1.4 g/dL (ref 0.4–1.8)
Globulin, Total: 3.5 g/dL (ref 2.2–3.9)
Total Protein ELP: 7 g/dL (ref 6.0–8.5)

## 2024-04-13 LAB — FLOW CYTOMETRY

## 2024-04-14 LAB — IMMUNOFIXATION ELECTROPHORESIS
IgA: 150 mg/dL (ref 61–437)
IgG (Immunoglobin G), Serum: 1325 mg/dL (ref 603–1613)
IgM (Immunoglobulin M), Srm: 95 mg/dL (ref 20–172)
Total Protein ELP: 7 g/dL (ref 6.0–8.5)

## 2024-04-16 ENCOUNTER — Ambulatory Visit (HOSPITAL_COMMUNITY): Admission: RE | Admit: 2024-04-16 | Source: Ambulatory Visit

## 2024-05-18 ENCOUNTER — Ambulatory Visit: Admitting: Hematology

## 2024-05-19 LAB — MISC LABCORP TEST (SEND OUT): Labcorp test code: 452312

## 2024-05-21 ENCOUNTER — Inpatient Hospital Stay: Attending: Hematology | Admitting: Hematology

## 2024-05-28 ENCOUNTER — Other Ambulatory Visit (HOSPITAL_COMMUNITY): Payer: Self-pay | Admitting: Student

## 2024-05-28 DIAGNOSIS — D61818 Other pancytopenia: Secondary | ICD-10-CM

## 2024-05-28 NOTE — H&P (Incomplete)
 Chief Complaint: Pancytopenia; referred for image guided bone marrow biopsy for further evaluation  Referring Provider(s): Katragadda,S  Supervising Physician: Luverne Aran  Patient Status: [facility] - Out-pt  History of Present Illness: Logan Brady is a 63 y.o. 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  Patient was subsequently lost to oncology follow-up.  He presents again today for image guided bone marrow biopsy for further evaluation.  *** Patient is Full Code  Past Medical History:  Diagnosis Date   Degeneration of lumbar intervertebral disc    Depression    Heart murmur    Hepatitis C    Hepatitis C 01/27/2019   Hypertension    Lumbar radiculopathy    Spinal stenosis of lumbar region    Spondylolysis    CHRONIC BILATERAL.  GRADE L5-S1.   Tobacco abuse     No past surgical history on file.  Allergies: Ibuprofen, Acetaminophen, Aspirin, Naproxen, and Penicillins  Medications: Prior to Admission medications   Medication Sig Start Date End Date Taking? Authorizing Provider  albuterol (VENTOLIN HFA) 108 (90 Base) MCG/ACT inhaler Inhale 1-2 puffs into the lungs every 4 (four) hours as needed for wheezing or shortness of breath.    [provider]  Cyanocobalamin  (B-12 COMPLIANCE INJECTION) 1000 MCG/ML KIT Inject as directed every 30 (thirty) days.    [provider]  hydrocortisone cream 1 % Apply 1 application topically 4 (four) times daily as needed for itching.    [provider]  neomycin-bacitracin-polymyxin (NEOSPORIN) ophthalmic ointment Place 1 application into both eyes 4 (four) times daily as needed (styes).    [provider]  rizatriptan (MAXALT) 5 MG tablet Take 5 mg by mouth daily as needed for migraine.    [provider]   sildenafil (VIAGRA) 100 MG tablet Take 100 mg by mouth daily as needed for erectile dysfunction.    [provider]  tiZANidine (ZANAFLEX) 4 MG tablet Take 4 mg by mouth every 6 (six) hours as needed for muscle spasms. 05/07/18   [provider]  traMADol (ULTRAM) 50 MG tablet Take 50 mg by mouth every 6 (six) hours as needed for moderate pain.    [provider]     Family History  Problem Relation Age of Onset   Heart attack Mother    Lung cancer Father     Social History   Socioeconomic History   Marital status: Divorced    Spouse name: Not on file   Number of children: 4   Years of education: 12   Highest education level: High school graduate  Occupational History   Occupation: maintenance  Tobacco Use   Smoking status: Every Day    Current packs/day: 1.00    Average packs/day: 1 pack/day for 40.0 years (40.0 ttl pk-yrs)    Types: Cigarettes   Smokeless tobacco: Never  Vaping Use   Vaping status: Never Used  Substance and Sexual Activity   Alcohol use: Yes    Comment: a couple Q few weeks   Drug use: Not Currently    Comment: quit 30 years ago   Sexual activity: Yes  Other Topics Concern   Not on file  Social History Narrative   Not on file   Social Drivers of Health   Financial Resource Strain: Low Risk  (05/07/2018)   Overall Financial Resource Strain (CARDIA)  Difficulty of Paying Living Expenses: Not hard at all  Food Insecurity: No Food Insecurity (05/07/2018)   Hunger Vital Sign    Worried About Running Out of Food in the Last Year: Never true    Ran Out of Food in the Last Year: Never true  Transportation Needs: No Transportation Needs (05/07/2018)   PRAPARE - Administrator, Civil Service (Medical): No    Lack of Transportation (Non-Medical): No  Physical Activity: Insufficiently Active (05/07/2018)   Exercise Vital Sign    Days of Exercise per Week: 2 days    Minutes of Exercise per Session: 30 min  Stress: No  Stress Concern Present (05/07/2018)   [employer] of Occupational Health - Occupational Stress Questionnaire    Feeling of Stress : Not at all  Social Connections: Moderately Isolated (05/07/2018)   Social Connection and Isolation Panel    Frequency of Communication with Friends and Family: More than three times a week    Frequency of Social Gatherings with Friends and Family: More than three times a week    Attends Religious Services: Never    Database administrator or Organizations: No    Attends Banker Meetings: Never    Marital Status: Divorced       Review of Systems  Vital Signs:   Advance Care Plan: No documents on file.  Physical Exam  Imaging: No results found.  Labs:  CBC: Recent Labs    04/09/24 0948  WBC 2.1*  HGB 8.9*  HCT 27.4*  PLT 100*    COAGS: No results for input(s): INR, APTT in the last 8760 hours.  BMP: No results for input(s): NA, K, CL, CO2, GLUCOSE, BUN, CALCIUM, CREATININE, GFRNONAA, GFRAA in the last 8760 hours.  Invalid input(s): CMP  LIVER FUNCTION TESTS: No results for input(s): BILITOT, AST, ALT, ALKPHOS, PROT, ALBUMIN in the last 8760 hours.  TUMOR MARKERS: No results for input(s): AFPTM, CEA, CA199, CHROMGRNA in the last 8760 hours.  Assessment and Plan: 63 y.o. 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  Patient was subsequently lost to oncology follow-up.  He presents again today for image guided bone marrow biopsy for further evaluation.Risks and benefits of procedure was discussed with the patient  including, but not limited to bleeding, infection, damage to adjacent structures or low yield requiring additional tests.  All of the questions were answered and there is agreement to  proceed.  Consent signed and in chart.    Thank you for allowing our service to participate in Logan Brady 's care.  Electronically Signed: D. Franky Rakers, PA-C   05/28/2024, 4:50 PM      I spent a total of  15 minutes   in face to face in clinical consultation, greater than 50% of which was counseling/coordinating care for image guided bone marrow biopsy

## 2024-05-29 ENCOUNTER — Ambulatory Visit (HOSPITAL_COMMUNITY)
Admission: RE | Admit: 2024-05-29 | Discharge: 2024-05-29 | Disposition: A | Discharge status: Home / Self Care | Source: Ambulatory Visit | Attending: Hematology | Admitting: Hematology

## 2024-05-29 ENCOUNTER — Other Ambulatory Visit: Payer: Self-pay

## 2024-05-29 ENCOUNTER — Encounter (HOSPITAL_COMMUNITY): Payer: Self-pay

## 2024-05-29 DIAGNOSIS — F17210 Nicotine dependence, cigarettes, uncomplicated: Secondary | ICD-10-CM | POA: Insufficient documentation

## 2024-05-29 DIAGNOSIS — B1920 Unspecified viral hepatitis C without hepatic coma: Secondary | ICD-10-CM | POA: Insufficient documentation

## 2024-05-29 DIAGNOSIS — C9140 Hairy cell leukemia not having achieved remission: Secondary | ICD-10-CM | POA: Insufficient documentation

## 2024-05-29 DIAGNOSIS — Z8619 Personal history of other infectious and parasitic diseases: Secondary | ICD-10-CM | POA: Insufficient documentation

## 2024-05-29 DIAGNOSIS — D61818 Other pancytopenia: Secondary | ICD-10-CM | POA: Diagnosis present

## 2024-05-29 DIAGNOSIS — I10 Essential (primary) hypertension: Secondary | ICD-10-CM | POA: Insufficient documentation

## 2024-05-29 LAB — CBC WITH DIFFERENTIAL/PLATELET
Abs Immature Granulocytes: 0.01 K/uL (ref 0.00–0.07)
Basophils Absolute: 0 K/uL (ref 0.0–0.1)
Basophils Relative: 0 %
Eosinophils Absolute: 0.1 K/uL (ref 0.0–0.5)
Eosinophils Relative: 4 %
HCT: 27.6 % — ABNORMAL LOW (ref 39.0–52.0)
Hemoglobin: 8.8 g/dL — ABNORMAL LOW (ref 13.0–17.0)
Immature Granulocytes: 0 %
Lymphocytes Relative: 66 %
Lymphs Abs: 1.8 K/uL (ref 0.7–4.0)
MCH: 34.8 pg — ABNORMAL HIGH (ref 26.0–34.0)
MCHC: 31.9 g/dL (ref 30.0–36.0)
MCV: 109.1 fL — ABNORMAL HIGH (ref 80.0–100.0)
Monocytes Absolute: 0 K/uL — ABNORMAL LOW (ref 0.1–1.0)
Monocytes Relative: 1 %
Neutro Abs: 0.8 K/uL — ABNORMAL LOW (ref 1.7–7.7)
Neutrophils Relative %: 29 %
Platelets: 99 K/uL — ABNORMAL LOW (ref 150–400)
RBC: 2.53 MIL/uL — ABNORMAL LOW (ref 4.22–5.81)
RDW: 17.6 % — ABNORMAL HIGH (ref 11.5–15.5)
WBC: 2.8 K/uL — ABNORMAL LOW (ref 4.0–10.5)
nRBC: 0 % (ref 0.0–0.2)

## 2024-05-29 MED ORDER — MIDAZOLAM HCL 2 MG/2ML IJ SOLN
INTRAMUSCULAR | Status: AC
  Filled 2024-05-29: qty 2

## 2024-05-29 MED ORDER — SODIUM CHLORIDE 0.9 % IV SOLN: INTRAVENOUS | Status: DC

## 2024-05-29 MED ORDER — MIDAZOLAM HCL 2 MG/2ML IJ SOLN
INTRAMUSCULAR | Status: AC | PRN
  Administered 2024-05-29: 1 mg via INTRAVENOUS

## 2024-05-29 MED ORDER — FENTANYL CITRATE (PF) 100 MCG/2ML IJ SOLN
INTRAMUSCULAR | Status: AC
  Filled 2024-05-29: qty 2

## 2024-05-29 MED ORDER — FENTANYL CITRATE (PF) 100 MCG/2ML IJ SOLN
INTRAMUSCULAR | Status: AC | PRN
  Administered 2024-05-29: 50 ug via INTRAVENOUS

## 2024-05-29 NOTE — Sedation Documentation (Signed)
 RN Diondra Pines pulled 2 mg Versed  and 100 mcg Fentanyl  in IR med room. Pt. Received 2 mg Versed  and 100 mcg Fentanyl  throughout the procedure.

## 2024-05-29 NOTE — Progress Notes (Signed)
 1230 Ice bag to use at home for comfort to low back.

## 2024-05-29 NOTE — Procedures (Signed)
 Interventional Radiology Procedure Note  Procedure: CT guided bone marrow aspiration and biopsy  Complications: None  EBL: < 10 mL  Findings: Aspirate and core biopsy performed of bone marrow in right iliac bone.  Plan: Bedrest supine x 1 hrs  Ericha Whittingham T. Fredia Sorrow, M.D Pager:  [phone]

## 2024-05-29 NOTE — Discharge Instructions (Signed)
Discharge Instructions:   Please call Interventional Radiology clinic [phone] with any questions or concerns.  You may remove your dressing and shower tomorrow.    Bone Marrow Aspiration and Bone Marrow Biopsy, Adult, Care After This sheet gives you information about how to care for yourself after your procedure. Your health care provider may also give you more specific instructions. If you have problems or questions, contact your health care provider. What can I expect after the procedure? After the procedure, it is common to have: Mild pain and tenderness. Swelling. Bruising. Follow these instructions at home: Puncture site care  Follow instructions from your health care provider about how to take care of the puncture site. Make sure you: Wash your hands with soap and water before and after you change your bandage (dressing). If soap and water are not available, use hand sanitizer. Change your dressing as told by your health care provider. Check your puncture site every day for signs of infection. Check for: More redness, swelling, or pain. Fluid or blood. Warmth. Pus or a bad smell. Activity Return to your normal activities as told by your health care provider. Ask your health care provider what activities are safe for you. Do not lift anything that is heavier than 10 lb (4.5 kg), or the limit that you are told, until your health care provider says that it is safe. Do not drive for 24 hours if you were given a sedative during your procedure. General instructions  Take over-the-counter and prescription medicines only as told by your health care provider. Do not take baths, swim, or use a hot tub until your health care provider approves. Ask your health care provider if you may take showers. You may only be allowed to take sponge baths. If directed, put ice on the affected area. To do this: Put ice in a plastic bag. Place a towel between your skin and the bag. Leave the ice  on for 20 minutes, 2-3 times a day. Keep all follow-up visits as told by your health care provider. This is important. Contact a health care provider if: Your pain is not controlled with medicine. You have a fever. You have more redness, swelling, or pain around the puncture site. You have fluid or blood coming from the puncture site. Your puncture site feels warm to the touch. You have pus or a bad smell coming from the puncture site. Summary After the procedure, it is common to have mild pain, tenderness, swelling, and bruising. Follow instructions from your health care provider about how to take care of the puncture site and what activities are safe for you. Take over-the-counter and prescription medicines only as told by your health care provider. Contact a health care provider if you have any signs of infection, such as fluid or blood coming from the puncture site. This information is not intended to replace advice given to you by your health care provider. Make sure you discuss any questions you have with your health care provider. Document Revised: 03/17/2019 Document Reviewed: 03/17/2019 Elsevier Patient Education  2023 [employer]   Moderate Conscious Sedation, Adult, Care After This sheet gives you information about how to care for yourself after your procedure. Your health care provider may also give you more specific instructions. If you have problems or questions, contact your health care provider. What can I expect after the procedure? After the procedure, it is common to have: Sleepiness for several hours. Impaired judgment for several hours. Difficulty with balance. Vomiting if   you eat too soon. Follow these instructions at home: For the time period you were told by your health care provider: Rest. Do not participate in activities where you could fall or become injured. Do not drive or use machinery. Do not drink alcohol. Do not take sleeping pills or medicines that  cause drowsiness. Do not make important decisions or sign legal documents. Do not take care of children on your own. Eating and drinking  Follow the diet recommended by your health care provider. Drink enough fluid to keep your urine pale yellow. If you vomit: Drink water, juice, or soup when you can drink without vomiting. Make sure you have little or no nausea before eating solid foods. General instructions Take over-the-counter and prescription medicines only as told by your health care provider. Have a responsible adult stay with you for the time you are told. It is important to have someone help care for you until you are awake and alert. Do not smoke. Keep all follow-up visits as told by your health care provider. This is important. Contact a health care provider if: You are still sleepy or having trouble with balance after 24 hours. You feel light-headed. You keep feeling nauseous or you keep vomiting. You develop a rash. You have a fever. You have redness or swelling around the IV site. Get help right away if: You have trouble breathing. You have new-onset confusion at home. Summary After the procedure, it is common to feel sleepy, have impaired judgment, or feel nauseous if you eat too soon. Rest after you get home. Know the things you should not do after the procedure. Follow the diet recommended by your health care provider and drink enough fluid to keep your urine pale yellow. Get help right away if you have trouble breathing or new-onset confusion at home. This information is not intended to replace advice given to you by your health care provider. Make sure you discuss any questions you have with your health care provider. Document Revised: 02/26/2020 Document Reviewed: 09/24/2019 Elsevier Patient Education  2023 [employer]  

## 2024-06-02 LAB — SURGICAL PATHOLOGY

## 2024-06-09 ENCOUNTER — Encounter (HOSPITAL_COMMUNITY): Payer: Self-pay | Admitting: Hematology

## 2024-06-15 ENCOUNTER — Inpatient Hospital Stay: Admitting: Oncology

## 2024-06-18 ENCOUNTER — Ambulatory Visit (HOSPITAL_COMMUNITY)
Admission: RE | Admit: 2024-06-18 | Discharge: 2024-06-18 | Disposition: A | Discharge status: Home / Self Care | Source: Ambulatory Visit | Attending: Hematology | Admitting: Hematology

## 2024-06-18 DIAGNOSIS — C8510 Unspecified B-cell lymphoma, unspecified site: Secondary | ICD-10-CM | POA: Insufficient documentation

## 2024-06-18 DIAGNOSIS — R911 Solitary pulmonary nodule: Secondary | ICD-10-CM | POA: Diagnosis not present

## 2024-06-18 DIAGNOSIS — R161 Splenomegaly, not elsewhere classified: Secondary | ICD-10-CM | POA: Insufficient documentation

## 2024-06-18 DIAGNOSIS — D61818 Other pancytopenia: Secondary | ICD-10-CM | POA: Insufficient documentation

## 2024-06-18 MED ORDER — FLUDEOXYGLUCOSE F - 18 (FDG) INJECTION
10.5500 | Freq: Once | INTRAVENOUS | Status: AC | PRN
  Administered 2024-06-18: 10.55 via INTRAVENOUS

## 2024-06-23 NOTE — Progress Notes (Signed)
 Patient Care Team: Shona Norleen PEDLAR, MD as PCP - General (Internal Medicine)  Clinic Day:  06/24/2024  Referring physician: Shona Norleen PEDLAR, MD   CHIEF COMPLAINT:  CC: Hairy cell leukemia.     Logan Brady 63 y.o. male was transferred to my care after his prior physician has left.   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  Treatment is going to be IV infusion for 5 days.  Discussed that 90 to 97% of patients achieve complete remission with this treatment and the median overall survival exceeds 90% at 10 to 15 years.  Common side effects include myelosuppression, infections and increased risk of second primary malignancies.  Relapse can occur in approximately 25 to 30% of patients, at that time can consider retreatment with cladribine  and rituximab.  Printed information about the medication was provided. -Patient understood the risks vs benefits and agreed to go ahead with the treatment - Will obtain a CD4 T-cell level today, if less than 200 will consider starting prophylaxis with acyclovir and Bactrim.  Will repeat these in 3 weeks. - Will obtain CMV quant  level today and repeat every 2 to 3 weeks until after 2 months of completion of cladribine  therapy. -Patient has positive T-cell rearrangement.  Even though it does not have treatment implications, studies have shown that it can carry poor prognosis. - Will obtain baseline labs today including CBC with differential, CMP, LDH, CD4 T cells, CMV quant.  Return  to clinic in 3 weeks with labs Tobacco use Patient is a chronic smoker.  Smokes at least a pack per day Has not smoking for the past 1 week.  - Requested for nicotine  patch.  Will prescribe today.    The patient understands the plans discussed today and is in agreement with them.  He knows to contact our office if he develops concerns prior to his next appointment.  50 minutes of total time was spent for this patient encounter, including preparation, face-to-face counseling with the patient and coordination of care, physical exam, and documentation of the encounter. > 50% of the time was spent on counseling as documented under my assessment and plan.    Logan Dry, MD  [facility] - A DEPT OF [name] [address] [place] [address] Dept: [phone] Dept Fax: [phone]   Orders Placed This Encounter  Procedures   CBC with Differential    Standing Status:   Future    Number of Occurrences:   1    Expected Date:   06/24/2024    Expiration Date:   09/22/2024   Comprehensive metabolic panel    Standing Status:   Future    Number of Occurrences:   1    Expected Date:   06/24/2024    Expiration Date:   09/22/2024   CMV dna by pcr, qualitative    Standing Status:   Future    Number of Occurrences:   1    Expected Date:   06/24/2024  Expiration Date:   09/22/2024   Hepatitis panel, acute    Standing Status:   Future    Number of Occurrences:   1    Expected Date:   06/24/2024    Expiration Date:   09/22/2024   Lactate dehydrogenase    Standing Status:   Future    Number of Occurrences:   1    Expected Date:   06/24/2024    Expiration Date:   06/24/2025   CMV DNA, quantitative, PCR    Standing Status:   Future    Number of Occurrences:   1    Expiration Date:   06/24/2025   T-helper cells CD4/CD8 %    Standing Status:   Future    Number of Occurrences:   1    Expiration Date:   06/24/2025   CBC  with Differential    Standing Status:   Future    Expected Date:   07/15/2024    Expiration Date:   10/13/2024   Comprehensive metabolic panel    Standing Status:   Future    Expected Date:   07/15/2024    Expiration Date:   10/13/2024   Lactate dehydrogenase    Standing Status:   Future    Expected Date:   07/15/2024    Expiration Date:   10/13/2024   CMV DNA, quantitative, PCR    Standing Status:   Future    Expected Date:   07/15/2024    Expiration Date:   10/13/2024   T-helper cells CD4/CD8 %    Standing Status:   Future    Expected Date:   07/15/2024    Expiration Date:   10/13/2024     ONCOLOGY HISTORY:   I have reviewed his chart and materials related to his cancer extensively and collaborated history with the patient. Summary of oncologic history is as follows:   Hairy Cell Leukemia:  -02/102022: Was being seen for pancytopenia.  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  There does appear to be some associated fibrosis which raises the possibility of hairy cell leukemia.  Some neoplastic lymphocytes expresses cyclin D1.  BCL6, CCND1/IgH t(11;14), IgH/BCL2 t(14;18), MALT1 (18q21): Not detected. Normal male karyotype. -10/12/2021: PET/CT: Splenomegaly.  Mild diffuse uptake throughout the spleen is noted again with SUV max of 3 point 0.6.  This is compared with liver activity of 2.89.  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  Mild, nonspecific uptake is also identified throughout the bone marrow of the axial and proximal appendicular skeleton.  The SUV max ranges between 3.05 and 2.3 are on background liver activity.  Mild uptake, above background liver activity, is noted within the porta hepatis region with an SUV max of 3.71.  No adenopathy or mass identified within the neck chest, abdomen or pelvis -04/09/2024: Peripheral blood flow  cytometry: 0.5 to 1% of lymphocytes kappa restricted B cells with hairy cell immunophenotype (positive for CD103/CD11c) -04/09/2024: Myeloid NGS: No clinically significant variants identified -05/29/2024: Bone marrow biopsy: Flow cytometry: 40% of lymphocytes are T cells with loss of CD7 and coexpressing CD8.  8% of the lymphocytes are B cells with hairy cell leukemia immunophenotype(CD11c and CD103 positive/kappa restricted)  -Pathology: Suboptimal marrow with involvement by a B-cell population(essentially entire available marrow) consistent with hairy cell leukemia.  This is a limited biopsy with extensive population of CD20 positive B cells which coexpress cyclin D1  but are negative for CD5 and CD10. -06/09/2024: T cell receptor gene arrangement: Positive -06/09/2024: BRAF V600E: Positive/detected -06/18/2024:Persistent subcentimeter mediastinal lymph node anterior to SVC with mild FDG uptake, stable to prior. No new lymphadenopathy. (Deauville score3).Splenomegaly with diffuse FDG uptake, stable to prior. A new non FDG avid nodule in right lung base.  Current Treatment:  Cladribine   INTERVAL HISTORY:   Logan Brady is here today for follow up and transfer of care.  Patient has no complaints today.  Denies fever, chills, loss of appetite, abdominal pain, bloating, fatigue.  We discussed the diagnosis of hairy cell leukemia and considering patient has anemia and thrombocytopenia will qualify for treatment.  Discussed risk versus benefits of cladribine  treatment and patient agreed to proceed with that.  Will obtain baseline labs today.  I have reviewed the past medical history, past surgical history, social history and family history with the patient and they are unchanged from previous note.  ALLERGIES:  is allergic to ibuprofen, acetaminophen , aspirin, naproxen, and penicillins.  MEDICATIONS:  Current Outpatient Medications  Medication Sig Dispense Refill   albuterol (VENTOLIN HFA) 108 (90  Base) MCG/ACT inhaler Inhale 1-2 puffs into the lungs every 4 (four) hours as needed for wheezing or shortness of breath.     Cyanocobalamin  (B-12 COMPLIANCE INJECTION) 1000 MCG/ML KIT Inject as directed every 30 (thirty) days.     hydrocortisone cream 1 % Apply 1 application topically 4 (four) times daily as needed for itching.     neomycin-bacitracin-polymyxin (NEOSPORIN) ophthalmic ointment Place 1 application into both eyes 4 (four) times daily as needed (styes).     nicotine  (NICODERM CQ ) 14 mg/24hr patch Place 1 patch (14 mg total) onto the skin daily. 28 patch 1   rizatriptan (MAXALT) 5 MG tablet Take 5 mg by mouth daily as needed for migraine.     sildenafil (VIAGRA) 100 MG tablet Take 100 mg by mouth daily as needed for erectile dysfunction.     tiZANidine (ZANAFLEX) 4 MG tablet Take 4 mg by mouth every 6 (six) hours as needed for muscle spasms.  1   traMADol  (ULTRAM ) 50 MG tablet Take 50 mg by mouth every 6 (six) hours as needed for moderate pain.     No current facility-administered medications for this visit.   Facility-Administered Medications Ordered in Other Visits  Medication Dose Route Frequency Provider Last Rate Last Admin   cyanocobalamin  ((VITAMIN B-12)) injection 1,000 mcg  1,000 mcg Intramuscular Once Katragadda, Sreedhar, MD       cyanocobalamin  ((VITAMIN B-12)) injection 1,000 mcg  1,000 mcg Intramuscular Once Katragadda, Sreedhar, MD        REVIEW OF SYSTEMS:   Constitutional: Denies fevers, chills or abnormal weight loss Eyes: Denies blurriness of vision Ears, nose, mouth, throat, and face: Denies mucositis or sore throat Respiratory: Denies cough, dyspnea or wheezes Cardiovascular: Denies palpitation, chest discomfort or lower extremity swelling Gastrointestinal:  Denies nausea, heartburn or change in bowel habits Skin: Denies abnormal skin rashes Lymphatics: Denies new lymphadenopathy or easy bruising Neurological:Denies numbness, tingling or new  weaknesses Behavioral/Psych: Mood is stable, no new changes  All other systems were reviewed with the patient and are negative.   VITALS:  Blood pressure (!) 148/95, pulse (!) 58, temperature (!) 97.4 F (36.3 C), temperature source Tympanic, resp. rate 18, weight 202 lb 12.8 oz (92 kg), SpO2 100%.  Wt Readings from Last 3 Encounters:  06/24/24 202 lb 12.8 oz (92 kg)  05/29/24 198 lb (89.8 kg)  04/09/24 202 lb (91.6  kg)    Body mass index is 26.04 kg/m.  Performance status (ECOG): 0 - Asymptomatic  PHYSICAL EXAM:   GENERAL:alert, no distress and comfortable SKIN: skin color, texture, turgor are normal, no rashes or significant lesions NECK: supple, thyroid  normal size, non-tender, without nodularity LYMPH:  no palpable lymphadenopathy in the cervical, axillary or inguinal LUNGS: clear to auscultation and percussion with normal breathing effort HEART: regular rate & rhythm and no murmurs and no lower extremity edema ABDOMEN:abdomen soft, non-tender and normal bowel sounds Musculoskeletal:no cyanosis of digits and no clubbing  NEURO: alert & oriented x 3 with fluent speech, no focal motor/sensory deficits  LABORATORY DATA:  I have reviewed the data as listed    Component Value Date/Time   NA 138 06/24/2024 0904   K 4.1 06/24/2024 0904   CL 104 06/24/2024 0904   CO2 25 06/24/2024 0904   GLUCOSE 100 (H) 06/24/2024 0904   BUN 18 06/24/2024 0904   CREATININE 1.30 (H) 06/24/2024 0904   CALCIUM 8.9 06/24/2024 0904   PROT 7.5 06/24/2024 0904   ALBUMIN 4.1 06/24/2024 0904   AST 13 (L) 06/24/2024 0904   ALT 9 06/24/2024 0904   ALKPHOS 42 06/24/2024 0904   BILITOT 0.5 06/24/2024 0904   GFRNONAA >60 06/24/2024 0904   GFRAA >60 01/19/2019 2141    Lab Results  Component Value Date   WBC 2.9 (L) 06/24/2024   NEUTROABS 0.8 (L) 06/24/2024   HGB 8.5 (L) 06/24/2024   HCT 26.1 (L) 06/24/2024   MCV 109.7 (H) 06/24/2024   PLT 100 (L) 06/24/2024      Chemistry      Component  Value Date/Time   NA 138 06/24/2024 0904   K 4.1 06/24/2024 0904   CL 104 06/24/2024 0904   CO2 25 06/24/2024 0904   BUN 18 06/24/2024 0904   CREATININE 1.30 (H) 06/24/2024 0904      Component Value Date/Time   CALCIUM 8.9 06/24/2024 0904   ALKPHOS 42 06/24/2024 0904   AST 13 (L) 06/24/2024 0904   ALT 9 06/24/2024 0904   BILITOT 0.5 06/24/2024 0904       RADIOGRAPHIC STUDIES: I have personally reviewed the radiological images as listed and agreed with the findings in the report.  NM PET Image Restage (PS) Skull Base to Thigh (F-18 FDG) CLINICAL DATA:  Subsequent treatment strategy for non-Hodgkin B-cell lymphoma, pancytopenia  EXAM: NUCLEAR MEDICINE PET SKULL BASE TO THIGH  TECHNIQUE: 10.55 mCi F-18 FDG was injected intravenously. Full-ring PET imaging was performed from the skull base to thigh after the radiotracer. CT data was obtained and used for attenuation correction and anatomic localization.  Fasting blood glucose: 120 mg/dl  COMPARISON:  PET-CT October 12, 2021  FINDINGS: Mediastinal blood pool activity: SUV max 2  Liver activity: SUV max 2.9  NECK: Unremarkable  Incidental CT findings: None.  CHEST: Prevascular subcentimeter lymph node anterior to SVC with metabolic activity above blood pool and below / equal to liver, max SUV 2.9, previously max SUV 2.7. Additional subcentimeter prevascular, bilateral lower paratracheal lymph nodes are non FDG avid/below blood pool. Mild cardiomegaly.  Incidental CT findings: There is a new subpleural nodule in right lung base posteromedial aspect measuring 7 mm without FDG uptake (202/112). Right lower lobe pulmonary nodule measuring 6 mm is stable to prior and non FDG avid (2/97). No suspicious findings to suggest  ABDOMEN/PELVIS: No suspicious lymphadenopathy or abnormal metabolic activity to suggest visceral involvement by lymphoma. Mild splenomegaly measuring 14.4 cm with homogeneous  metabolic activity max  SUV 3.5, stable to prior.  Incidental CT findings: Sigmoid diverticulosis without diverticulitis. Mild prostatomegaly without focal activity.  SKELETON: Diffuse uptake throughout the bone marrow of the appendicular bones, nonspecific likely due to GCSF/chemotherapy. No suspicious lytic or sclerotic osseous lesion.  Incidental CT findings: None.  IMPRESSION: Persistent subcentimeter mediastinal lymph node anterior to SVC with mild FDG uptake, stable to prior. No new lymphadenopathy. (Deauville score3).  A new non FDG avid nodule in right lung base. Stable appearance of pre-existing right lower lobe non FDG avid). Nodule. These findings may be unrelated to lymphoma (possibly due to infection/inflammation, focal mucostasis or periosteophyte fibrosis) (Deauville score X).  Splenomegaly with diffuse FDG uptake, stable to prior.  Electronically Signed   By: Megan  Zare M.D.   On: 06/23/2024 18:13

## 2024-06-24 ENCOUNTER — Inpatient Hospital Stay

## 2024-06-24 ENCOUNTER — Other Ambulatory Visit: Payer: Self-pay

## 2024-06-24 ENCOUNTER — Other Ambulatory Visit: Payer: Self-pay | Admitting: Oncology

## 2024-06-24 ENCOUNTER — Inpatient Hospital Stay: Attending: Hematology | Admitting: Oncology

## 2024-06-24 VITALS — BP 148/95 | HR 58 | Temp 97.4°F | Resp 18 | Wt 202.8 lb

## 2024-06-24 DIAGNOSIS — Z720 Tobacco use: Secondary | ICD-10-CM | POA: Diagnosis not present

## 2024-06-24 DIAGNOSIS — C9140 Hairy cell leukemia not having achieved remission: Secondary | ICD-10-CM

## 2024-06-24 DIAGNOSIS — Z5111 Encounter for antineoplastic chemotherapy: Secondary | ICD-10-CM | POA: Insufficient documentation

## 2024-06-24 DIAGNOSIS — F17210 Nicotine dependence, cigarettes, uncomplicated: Secondary | ICD-10-CM | POA: Insufficient documentation

## 2024-06-24 DIAGNOSIS — R5383 Other fatigue: Secondary | ICD-10-CM | POA: Diagnosis not present

## 2024-06-24 LAB — CBC WITH DIFFERENTIAL/PLATELET
Abs Immature Granulocytes: 0.01 K/uL (ref 0.00–0.07)
Basophils Absolute: 0 K/uL (ref 0.0–0.1)
Basophils Relative: 0 %
Eosinophils Absolute: 0.1 K/uL (ref 0.0–0.5)
Eosinophils Relative: 3 %
HCT: 26.1 % — ABNORMAL LOW (ref 39.0–52.0)
Hemoglobin: 8.5 g/dL — ABNORMAL LOW (ref 13.0–17.0)
Immature Granulocytes: 0 %
Lymphocytes Relative: 67 %
Lymphs Abs: 2 K/uL (ref 0.7–4.0)
MCH: 35.7 pg — ABNORMAL HIGH (ref 26.0–34.0)
MCHC: 32.6 g/dL (ref 30.0–36.0)
MCV: 109.7 fL — ABNORMAL HIGH (ref 80.0–100.0)
Monocytes Absolute: 0 K/uL — ABNORMAL LOW (ref 0.1–1.0)
Monocytes Relative: 1 %
Neutro Abs: 0.8 K/uL — ABNORMAL LOW (ref 1.7–7.7)
Neutrophils Relative %: 29 %
Platelets: 100 K/uL — ABNORMAL LOW (ref 150–400)
RBC: 2.38 MIL/uL — ABNORMAL LOW (ref 4.22–5.81)
RDW: 17.3 % — ABNORMAL HIGH (ref 11.5–15.5)
Smear Review: NORMAL
WBC: 2.9 K/uL — ABNORMAL LOW (ref 4.0–10.5)
nRBC: 0 % (ref 0.0–0.2)

## 2024-06-24 LAB — COMPREHENSIVE METABOLIC PANEL WITH GFR
ALT: 9 U/L (ref 0–44)
AST: 13 U/L — ABNORMAL LOW (ref 15–41)
Albumin: 4.1 g/dL (ref 3.5–5.0)
Alkaline Phosphatase: 42 U/L (ref 38–126)
Anion gap: 9 (ref 5–15)
BUN: 18 mg/dL (ref 8–23)
CO2: 25 mmol/L (ref 22–32)
Calcium: 8.9 mg/dL (ref 8.9–10.3)
Chloride: 104 mmol/L (ref 98–111)
Creatinine, Ser: 1.3 mg/dL — ABNORMAL HIGH (ref 0.61–1.24)
GFR, Estimated: >60 mL/min (ref >60)
Glucose, Bld: 100 mg/dL — ABNORMAL HIGH (ref 70–99)
Potassium: 4.1 mmol/L (ref 3.5–5.1)
Sodium: 138 mmol/L (ref 135–145)
Total Bilirubin: 0.5 mg/dL (ref 0.0–1.2)
Total Protein: 7.5 g/dL (ref 6.5–8.1)

## 2024-06-24 LAB — HEPATITIS PANEL, ACUTE
HCV Ab: REACTIVE — AB
Hep A IgM: NONREACTIVE
Hep B C IgM: NONREACTIVE
Hepatitis B Surface Ag: NONREACTIVE

## 2024-06-24 LAB — LACTATE DEHYDROGENASE: LDH: 111 U/L (ref 98–192)

## 2024-06-24 MED ORDER — NICOTINE 14 MG/24HR TD PT24: 14.0000 mg | MEDICATED_PATCH | Freq: Every day | TRANSDERMAL | 1 refills | Status: AC

## 2024-06-24 NOTE — Patient Instructions (Addendum)
 [facility] at [facility] Discharge Instructions   You were seen and examined today by Dr. Davonna.  She reviewed the results of your bone marrow biopsy which is showing a condition called hairy cell leukemia.    We will plan to treat you with a drug called cladribine . This is an infusion given in the clinic for 5 days in a row, Monday-Friday for one cycle only. The hairy cell leukemia responds very well to this treatment.   We will arrange for you to have an education session prior to treatment.   Return as scheduled.    Thank you for choosing [facility] at [facility] to provide your oncology and hematology care.  To afford each patient quality time with our provider, please arrive at least 15 minutes before your scheduled appointment time.   If you have a lab appointment with the Cancer Center please come in thru the Main Entrance and check in at the main information desk.  You need to re-schedule your appointment should you arrive 10 or more minutes late.  We strive to give you quality time with our providers, and arriving late affects you and other patients whose appointments are after yours.  Also, if you no show three or more times for appointments you may be dismissed from the clinic at the providers discretion.     Again, thank you for choosing [facility].  Our hope is that these requests will decrease the amount of time that you wait before being seen by our physicians.       _____________________________________________________________  Should you have questions after your visit to [facility], please contact our office at [phone] and follow the prompts.  Our office hours are 8:00 a.m. and 4:30 p.m. Monday - Friday.  Please note that voicemails left after 4:00 p.m. may not be returned until the following business day.  We are closed weekends and major holidays.  You do have access to a nurse 24-7, just  call the main number to the clinic [phone] and do not press any options, hold on the line and a nurse will answer the phone.    For prescription refill requests, have your pharmacy contact our office and allow 72 hours.    Due to Covid, you will need to wear a mask upon entering the hospital. If you do not have a mask, a mask will be given to you at the Main Entrance upon arrival. For doctor visits, patients may have 1 support person age 20 or older with them. For treatment visits, patients can not have anyone with them due to social distancing guidelines and our immunocompromised population.

## 2024-06-24 NOTE — Assessment & Plan Note (Addendum)
 Patient is a chronic smoker.  Smokes at least a pack per day Has not smoking for the past 1 week.  - Requested for nicotine  patch.  Will prescribe today.

## 2024-06-24 NOTE — Assessment & Plan Note (Signed)
 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  Treatment is going to be IV infusion for 5 days.  Discussed that 90 to 97% of patients achieve complete remission with this treatment and the median overall survival exceeds 90% at 10 to 15 years.  Common side effects include myelosuppression, infections and increased risk of second primary malignancies.  Relapse can occur in approximately 25 to 30% of patients, at that time can consider retreatment with cladribine  and rituximab.  Printed information about the medication was provided. -Patient understood the risks vs benefits and agreed to go ahead with the treatment - Will obtain a CD4 T-cell level today, if less than 200 will consider starting prophylaxis with acyclovir and Bactrim.  Will repeat these in 3 weeks. - Will obtain CMV quant  level today and repeat every 2 to 3 weeks until after 2 months of completion of cladribine  therapy. -Patient has positive T-cell rearrangement.  Even though it does not have treatment implications, studies have shown that it can carry poor prognosis. - Will obtain baseline labs today including CBC with differential, CMP, LDH, CD4 T cells, CMV quant.  Return to clinic in 3 weeks with labs

## 2024-06-24 NOTE — Patient Instructions (Signed)
 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 We plan to treat you with a drug called cladribine. This is given here in the clinic on days 1-5 for a total of one cycle. The intent of the treatment is to control this disease and to alleviate the side effects, such as low hemoglobin and platelets, that you have been experiencing. You will see the doctor regularly throughout treatment.  We will obtain blood work from you prior to the first day of your treatment and monitor your results to make sure it is safe to give your treatment. We will also monitor your blood work regularly after you complete the one cycle of this medication. The doctor monitors your response to treatment by the way you are feeling, your blood work, and by obtaining scans periodically.  There will be wait times while you are here for treatment.  It will take about 30 minutes to 1 hour for your lab work to result.  Then there will be wait times while pharmacy mixes your medications.    About This Medicine   CLADRIBINE (KLA dri been) treats leukemia. It works by slowing down the growth of cancer cells. It is given in the vein (IV). It will take 2 hours to infuse. It is given here in the clinic 5 days in a row (Monday-Friday). We will give this for a total of one cycle.  Possible Side Effects    Bone marrow suppression. This is a decrease in the number of white blood cells, red blood cells, and platelets. This may raise your risk of infection, make you tired and weak, and raise your risk of bleeding.   Nausea   Tiredness   Fever   Infection   Headache   Rash   Injection site reaction - you may get a rash, swelling or bruising or your skin may get red, warm, itchy, or painful at the site of your infusion.   Note: All possible side effects are not included. Your side effects may be different depending on your cancer diagnosis, condition, or if you are receiving other medicines in  combination. Please discuss any concerns or questions with your care team.   Warnings and Precautions    Severe muscle weakness in your arms and/or legs which can be irreversible.    Effects on the nerves are called peripheral neuropathy. You may feel numbness, tingling, or pain in your hands and feet. It may be hard for you to button your clothes, open jars, or walk as usual. The effect on the nerves may get worse with more doses of the medicine. These effects get better in some people after the medicine is stopped but it does not get better in all people.    Severe bone marrow suppression and neutropenic fever, a type of fever that can develop when you have a very low number of white blood cells which can be life-threatening.    Risk of severe and life-threatening infections    Tumor lysis syndrome: This medicine may act on the cancer cells very quickly. This may affect how your kidneys work.    Changes in your kidney function Note: Some of the side effects above are very rare. If you have concerns and/or questions, please discuss them with your care team. Important Information    This medicine may be present in the urine, stool and other body fluids such as blood, vomit, semen, and vaginal fluids. Take precautions to prevent others from  coming in contact with your medicine or your body fluids. Follow safety precautions during your treatment and for as long as directed by your care team after your treatment. If you take a pill each day, follow these precautions every day.    Talk to your care team before receiving any vaccinations during your treatment. Some vaccinations are not recommended while receiving cladribine.   Treating Side Effects    Manage tiredness by pacing your activities for the day.    Be sure to include periods of rest between energy-draining activities.    To decrease the risk of infection, wash your hands regularly.    Avoid close contact with people who have a cold,  the flu, or other infections.    Take your temperature as your care team you, and whenever you feel like you may have a fever.    To help decrease the risk of bleeding, use a soft toothbrush. Check with your care team before using dental floss.    Be very careful when using knives or tools.    Use an electric shaver instead of a razor.    Drink enough fluids to keep your urine pale yellow.    If you throw up, you should drink more fluids so that you do not become dehydrated (lack of water in the body from losing too much fluid).    To help with nausea and vomiting, eat small, frequent meals instead of three large meals a day. Choose foods and drinks that are at room temperature. Ask your care team about other helpful tips and medicine that is available to help stop or lessen these symptoms.    If you get a rash do not put anything on it unless your care team says you may. Keep the area around the rash clean and dry. Ask your care team for medicine if your rash bothers you.    If you have numbness and tingling in your hands and feet, be careful when cooking, walking, and handling sharp objects and hot liquids.  While you are getting this medicine, please tell your care team right away if you get a rash, swelling or bruising or if your skin gets red, warm, itchy or painful at the site of the IV infusion.    Keeping your pain under control is important to your well-being. Please tell your care team if you are experiencing pain.    Food and Medicine Interactions    There are no known interactions of cladribine with food.    This medicine may interact with other medicines. Tell your care team about all the prescription and over-the-counter medicines and dietary supplements (vitamins, minerals, herbs, and others) that you are taking at this time. Also, check with your care team before starting any new prescription or over-the-counter medicines, or dietary supplements to make sure that there are  no interactions.   When to Call Your Care Team   Call your care team if you have any of these symptoms and/or any new or unusual symptoms:    Fever of 100.4 F (38 C) or higher    Chills    Tiredness that interferes with your daily activities    Feeling dizzy or lightheaded    Headache that does not go away    Weakness or lack of strength to your arms and/or legs    Easy bleeding or bruising    Nausea that stops you from eating or drinking and/or is not relieved by prescribed medicines  Throwing up more than 3 times a day    Decreased or very dark urine    Signs of tumor lysis: confusion or agitation, decreased urine, nausea/vomiting, diarrhea, muscle cramping, numbness and/or tingling, seizures.    Numbness, tingling, or pain in your hands and feet    Signs of infection: fever or chills, cough, trouble breathing, severe pain in your abdomen, difficulty urinating, burning or pain when you pass urine, redness and/or swelling of the skin    A new rash and/or itching or a rash that is not relieved by prescribed medicines Reproduction Warnings    Pregnancy warning: This medicine can cause serious birth defects. If you can become pregnant, use birth control while taking this medicine. Let your care team right away if you think you might be pregnant.    Breastfeeding warning: Talk with your care team about breastfeeding during treatment. You may need to stop breastfeeding.  Fertility warning: The effect of this medicine on fertility is not known. If you plan to have children, talk with you care team.  SELF CARE ACTIVITIES WHILE ON CHEMOTHERAPY/IMMUNOTHERAPY:  Hydration Increase your fluid intake and drink at least 64 ounces (2 liters) of water/decaffeinated beverages per day after treatment. You can still have your cup of coffee or soda but these beverages do not count as part of the 64 ounces that you need to drink daily. Limit alcohol intake.  Medications Continue taking  your normal prescription medication as prescribed.  If you start any new herbal or new supplements please let us  know first to make sure it is safe.  Mouth Care Have teeth cleaned professionally before starting treatment. Keep dentures and partial plates clean. Use soft toothbrush and do not use mouthwashes that contain alcohol. Biotene is a good mouthwash that is available at most pharmacies or may be ordered by calling [phone]. Use warm salt water gargles (1 teaspoon salt per 1 quart warm water) before and after meals and at bedtime. If you are still having problems with your mouth or sores in your mouth please call the clinic. If you need dental work, please let the doctor know before you go for your appointment so that we can coordinate the best possible time for you in regards to your chemo regimen. You need to also let your dentist know that you are actively taking chemo. We may need to do labs prior to your dental appointment.  Skin Care Always use sunscreen that has not expired and with SPF (Sun Protection Factor) of 50 or higher. Wear hats to protect your head from the sun. Remember to use sunscreen on your hands, ears, face, & feet.  Use good moisturizing lotions such as udder cream, eucerin, or even Vaseline. Some chemotherapies can cause dry skin, color changes in your skin and nails.    Avoid long, hot showers or baths. Use gentle, fragrance-free soaps and laundry detergent. Use moisturizers, preferably creams or ointments rather than lotions because the thicker consistency is better at preventing skin dehydration. Apply the cream or ointment within 15 minutes of showering. Reapply moisturizer at night, and moisturize your hands every time after you wash them.   Infection Prevention Please wash your hands for at least 30 seconds using warm soapy water. Handwashing is the #1 way to prevent the spread of germs. Stay away from sick people or people who are getting over a cold. If you  develop respiratory systems such as green/yellow mucus production or productive cough or persistent cough let us  know  and we will see if you need an antibiotic. It is a good idea to keep a pair of gloves on when going into grocery stores/[employer] to decrease your risk of coming into contact with germs on the carts, etc. Carry alcohol hand gel with you at all times and use it frequently if out in public. If your temperature reaches 100.5 or higher please call the clinic and let us  know.  If it is after hours or on the weekend please go to the ER if your temperature is over 100.4.  Please have your own personal thermometer at home to use.    Sex and bodily fluids If you are going to have sex, a condom must be used to protect the person that isn't taking immunotherapy. For a few days after treatment, immunotherapy can be excreted through your bodily fluids.  When using the toilet please close the lid and flush the toilet twice.  Do this for a few day after you have had immunotherapy.   Contraception It is not known for sure whether or not immunotherapy drugs can be passed on through semen or secretions from the vagina. Because of this some doctors advise people to use a barrier method if you have sex during treatment. This applies to vaginal, anal or oral sex.  Generally, doctors advise a barrier method only for the time you are actually having the treatment and for about a week after your treatment.  Advice like this can be worrying, but this does not mean that you have to avoid being intimate with your partner. You can still have close contact with your partner and continue to enjoy sex.  Animals If you have cats or birds we ask that you not change the litter or change the cage.  Please have someone else do this for you while you are on immunotherapy.   Food Safety During and After Cancer Treatment Food safety is important for people both during and after cancer treatment. Cancer and cancer treatments,  such as chemotherapy, radiation therapy, and stem cell/bone marrow transplantation, often weaken the immune system. This makes it harder for your body to protect itself from foodborne illness, also called food poisoning. Foodborne illness is caused by eating food that contains harmful bacteria, parasites, or viruses.  Foods to avoid Some foods have a higher risk of becoming tainted with bacteria. These include: Unwashed fresh fruit and vegetables, especially leafy vegetables that can hide dirt and other contaminants Raw sprouts, such as alfalfa sprouts Raw or undercooked beef, especially ground beef, or other raw or undercooked meat and poultry Fatty, fried, or spicy foods immediately before or after treatment.  These can sit heavy on your stomach and make you feel nauseous. Raw or undercooked shellfish, such as oysters. Sushi and sashimi, which often contain raw fish.  Unpasteurized beverages, such as unpasteurized fruit juices, raw milk, raw yogurt, or cider Undercooked eggs, such as soft boiled, over easy, and poached; raw, unpasteurized eggs; or foods made with raw egg, such as homemade raw cookie dough and homemade mayonnaise  Simple steps for food safety  Shop smart. Do not buy food stored or displayed in an unclean area. Do not buy bruised or damaged fruits or vegetables. Do not buy cans that have cracks, dents, or bulges. Pick up foods that can spoil at the end of your shopping trip and store them in a cooler on the way home.  Prepare and clean up foods carefully. Rinse all fresh fruits and vegetables under running water,  and dry them with a clean towel or paper towel. Clean the top of cans before opening them. After preparing food, wash your hands for 20 seconds with hot water and soap. Pay special attention to areas between fingers and under nails. Clean your utensils and dishes with hot water and soap. Disinfect your kitchen and cutting boards using 1 teaspoon of liquid,  unscented bleach mixed into 1 quart of water.    Dispose of old food. Eat canned and packaged food before its expiration date (the "use by" or "best before" date). Consume refrigerated leftovers within 3 to 4 days. After that time, throw out the food. Even if the food does not smell or look spoiled, it still may be unsafe. Some bacteria, such as Listeria, can grow even on foods stored in the refrigerator if they are kept for too long.  Take precautions when eating out. At restaurants, avoid buffets and salad bars where food sits out for a long time and comes in contact with many people. Food can become contaminated when someone with a virus, often a norovirus, or another "bug" handles it. Put any leftover food in a "to-go" container yourself, rather than having the server do it. And, refrigerate leftovers as soon as you get home. Choose restaurants that are clean and that are willing to prepare your food as you order it cooked.    SYMPTOMS TO REPORT AS SOON AS POSSIBLE AFTER TREATMENT:  FEVER GREATER THAN 100.4 F CHILLS WITH OR WITHOUT FEVER NAUSEA AND VOMITING THAT IS NOT CONTROLLED WITH YOUR NAUSEA MEDICATION UNUSUAL SHORTNESS OF BREATH UNUSUAL BRUISING OR BLEEDING TENDERNESS IN MOUTH AND THROAT WITH OR WITHOUT PRESENCE OF ULCERS URINARY PROBLEMS BOWEL PROBLEMS UNUSUAL RASH     Wear comfortable clothing and clothing appropriate for easy access to any Portacath or PICC line. Let us  know if there is anything that we can do to make your therapy better!   What to do if you need assistance after hours or on the weekends: CALL [phone].  HOLD on the line, do not hang up.  You will hear multiple messages but at the end you will be connected with a nurse triage line.  They will contact the doctor if necessary.  Most of the time they will be able to assist you.  Do not call the hospital operator.    I have been informed and understand all of the instructions given to me and have  received a copy. I have been instructed to call the clinic [phone] or my family physician as soon as possible for continued medical care, if indicated. I do not have any more questions at this time but understand that I may call the Cancer Center or the Patient Navigator at [phone] during office hours should I have questions or need assistance in obtaining follow-up care.

## 2024-06-25 ENCOUNTER — Inpatient Hospital Stay

## 2024-06-25 ENCOUNTER — Other Ambulatory Visit: Payer: Self-pay

## 2024-06-25 DIAGNOSIS — C9140 Hairy cell leukemia not having achieved remission: Secondary | ICD-10-CM

## 2024-06-25 LAB — T-HELPER CELLS CD4/CD8 %
% CD 4 Pos. Lymph.: 25.4 % — ABNORMAL LOW (ref 30.8–58.5)
Absolute CD 4 Helper: 406 /uL (ref 359–1519)
Basophils Absolute: 0 x10E3/uL (ref 0.0–0.2)
Basos: 0 %
CD3+CD4+ Cells/CD3+CD8+ Cells Bld: 0.35 — ABNORMAL LOW (ref 0.92–3.72)
CD3+CD8+ Cells # Bld: 1168 /uL — ABNORMAL HIGH (ref 109–897)
CD3+CD8+ Cells NFr Bld: 73 % — ABNORMAL HIGH (ref 12.0–35.5)
EOS (ABSOLUTE): 0.1 x10E3/uL (ref 0.0–0.4)
Eos: 4 %
Hematocrit: 27.3 % — ABNORMAL LOW (ref 37.5–51.0)
Hemoglobin: 8.6 g/dL — ABNORMAL LOW (ref 13.0–17.7)
Immature Grans (Abs): 0 x10E3/uL (ref 0.0–0.1)
Immature Granulocytes: 0 %
Lymphocytes Absolute: 1.6 x10E3/uL (ref 0.7–3.1)
Lymphs: 66 %
MCH: 34.5 pg — ABNORMAL HIGH (ref 26.6–33.0)
MCHC: 31.5 g/dL (ref 31.5–35.7)
MCV: 110 fL — ABNORMAL HIGH (ref 79–97)
Monocytes Absolute: 0 x10E3/uL — ABNORMAL LOW (ref 0.1–0.9)
Monocytes: 1 %
Neutrophils Absolute: 0.7 x10E3/uL — ABNORMAL LOW (ref 1.4–7.0)
Neutrophils: 29 %
Platelets: 105 x10E3/uL — ABNORMAL LOW (ref 150–450)
RBC: 2.49 x10E6/uL — ABNORMAL LOW (ref 4.14–5.80)
RDW: 17 % — ABNORMAL HIGH (ref 11.6–15.4)
WBC: 2.5 x10E3/uL — ABNORMAL LOW (ref 3.4–10.8)

## 2024-06-25 MED ORDER — LIDOCAINE-PRILOCAINE 2.5-2.5 % EX CREA: TOPICAL_CREAM | CUTANEOUS | 3 refills | Status: AC

## 2024-06-25 MED ORDER — PROCHLORPERAZINE MALEATE 10 MG PO TABS
10.0000 mg | ORAL_TABLET | Freq: Four times a day (QID) | ORAL | 1 refills | Status: AC | PRN
Start: 2024-06-25 — End: ?

## 2024-06-25 MED ORDER — ONDANSETRON HCL 8 MG PO TABS
8.0000 mg | ORAL_TABLET | Freq: Three times a day (TID) | ORAL | 1 refills | Status: AC | PRN
Start: 2024-06-25 — End: ?

## 2024-06-25 NOTE — Progress Notes (Signed)

## 2024-06-26 LAB — CYTOMEGALOVIRUS DNA, QUANTITATIVE REAL-TIME PCR, PLASMA
CMV DNA Quant: NEGATIVE [IU]/mL
Log10 CMV Qn DNA Pl: UNDETERMINED {Log_IU}/mL

## 2024-06-29 ENCOUNTER — Inpatient Hospital Stay

## 2024-06-29 ENCOUNTER — Ambulatory Visit

## 2024-06-29 VITALS — BP 116/79 | HR 65 | Temp 96.9°F | Resp 18 | Wt 203.4 lb

## 2024-06-29 DIAGNOSIS — Z5111 Encounter for antineoplastic chemotherapy: Secondary | ICD-10-CM | POA: Diagnosis not present

## 2024-06-29 DIAGNOSIS — C9140 Hairy cell leukemia not having achieved remission: Secondary | ICD-10-CM

## 2024-06-29 LAB — CBC WITH DIFFERENTIAL/PLATELET
Abs Immature Granulocytes: 0 K/uL (ref 0.00–0.07)
Basophils Absolute: 0 K/uL (ref 0.0–0.1)
Basophils Relative: 0 %
Eosinophils Absolute: 0.1 K/uL (ref 0.0–0.5)
Eosinophils Relative: 3 %
HCT: 24.5 % — ABNORMAL LOW (ref 39.0–52.0)
Hemoglobin: 8 g/dL — ABNORMAL LOW (ref 13.0–17.0)
Immature Granulocytes: 0 %
Lymphocytes Relative: 71 %
Lymphs Abs: 1.8 K/uL (ref 0.7–4.0)
MCH: 35.4 pg — ABNORMAL HIGH (ref 26.0–34.0)
MCHC: 32.7 g/dL (ref 30.0–36.0)
MCV: 108.4 fL — ABNORMAL HIGH (ref 80.0–100.0)
Monocytes Absolute: 0 K/uL — ABNORMAL LOW (ref 0.1–1.0)
Monocytes Relative: 1 %
Neutro Abs: 0.6 K/uL — ABNORMAL LOW (ref 1.7–7.7)
Neutrophils Relative %: 25 %
Platelets: 97 K/uL — ABNORMAL LOW (ref 150–400)
RBC: 2.26 MIL/uL — ABNORMAL LOW (ref 4.22–5.81)
RDW: 16.9 % — ABNORMAL HIGH (ref 11.5–15.5)
WBC: 2.5 K/uL — ABNORMAL LOW (ref 4.0–10.5)
nRBC: 0 % (ref 0.0–0.2)

## 2024-06-29 LAB — COMPREHENSIVE METABOLIC PANEL WITH GFR
ALT: 8 U/L (ref 0–44)
AST: 13 U/L — ABNORMAL LOW (ref 15–41)
Albumin: 3.7 g/dL (ref 3.5–5.0)
Alkaline Phosphatase: 41 U/L (ref 38–126)
Anion gap: 9 (ref 5–15)
BUN: 19 mg/dL (ref 8–23)
CO2: 24 mmol/L (ref 22–32)
Calcium: 8.5 mg/dL — ABNORMAL LOW (ref 8.9–10.3)
Chloride: 104 mmol/L (ref 98–111)
Creatinine, Ser: 1.33 mg/dL — ABNORMAL HIGH (ref 0.61–1.24)
GFR, Estimated: >60 mL/min (ref >60)
Glucose, Bld: 73 mg/dL (ref 70–99)
Potassium: 4.1 mmol/L (ref 3.5–5.1)
Sodium: 137 mmol/L (ref 135–145)
Total Bilirubin: <0.2 mg/dL (ref 0.0–1.2)
Total Protein: 7.1 g/dL (ref 6.5–8.1)

## 2024-06-29 LAB — LACTATE DEHYDROGENASE: LDH: 113 U/L (ref 98–192)

## 2024-06-29 LAB — CMV DNA BY PCR, QUALITATIVE: CMV DNA, Qual PCR: NEGATIVE

## 2024-06-29 LAB — URIC ACID: Uric Acid, Serum: 6.2 mg/dL (ref 3.7–8.6)

## 2024-06-29 LAB — MAGNESIUM: Magnesium: 2.1 mg/dL (ref 1.7–2.4)

## 2024-06-29 MED ORDER — PROCHLORPERAZINE MALEATE 10 MG PO TABS
10.0000 mg | ORAL_TABLET | Freq: Once | ORAL | Status: AC
  Administered 2024-06-29: 10 mg via ORAL
  Filled 2024-06-29: qty 1

## 2024-06-29 MED ORDER — SODIUM CHLORIDE 0.9 % IV SOLN
INTRAVENOUS | Status: DC
Start: 2024-06-29 — End: 2024-06-29

## 2024-06-29 MED ORDER — CLONIDINE HCL 0.1 MG PO TABS
0.2000 mg | ORAL_TABLET | Freq: Once | ORAL | Status: AC
  Administered 2024-06-29: 0.2 mg via ORAL
  Filled 2024-06-29: qty 2

## 2024-06-29 MED ORDER — SODIUM CHLORIDE 0.9 % IV SOLN
0.1500 mg/kg | Freq: Once | INTRAVENOUS | Status: AC
  Administered 2024-06-29: 14 mg via INTRAVENOUS
  Filled 2024-06-29: qty 14

## 2024-06-29 NOTE — Patient Instructions (Signed)
 [facility] [place] - A DEPT OF [facility]  Discharge Instructions: Thank you for choosing [facility] to provide your oncology and hematology care.  If you have a lab appointment with the Cancer Center - please note that after April 8th, 2024, all labs will be drawn in the cancer center.  You do not have to check in or register with the main entrance as you have in the past but will complete your check-in in the cancer center.  Wear comfortable clothing and clothing appropriate for easy access to any Portacath or PICC line.   We strive to give you quality time with your provider. You may need to reschedule your appointment if you arrive late (15 or more minutes).  Arriving late affects you and other patients whose appointments are after yours.  Also, if you miss three or more appointments without notifying the office, you may be dismissed from the clinic at the provider's discretion.      For prescription refill requests, have your pharmacy contact our office and allow 72 hours for refills to be completed.    Today you received the following chemotherapy and/or immunotherapy agents Cladribine .       To help prevent nausea and vomiting after your treatment, we encourage you to take your nausea medication as directed.  BELOW ARE SYMPTOMS THAT SHOULD BE REPORTED IMMEDIATELY: *FEVER GREATER THAN 100.4 F (38 C) OR HIGHER *CHILLS OR SWEATING *NAUSEA AND VOMITING THAT IS NOT CONTROLLED WITH YOUR NAUSEA MEDICATION *UNUSUAL SHORTNESS OF BREATH *UNUSUAL BRUISING OR BLEEDING *URINARY PROBLEMS (pain or burning when urinating, or frequent urination) *BOWEL PROBLEMS (unusual diarrhea, constipation, pain near the anus) TENDERNESS IN MOUTH AND THROAT WITH OR WITHOUT PRESENCE OF ULCERS (sore throat, sores in mouth, or a toothache) UNUSUAL RASH, SWELLING OR PAIN  UNUSUAL VAGINAL DISCHARGE OR ITCHING   Items with * indicate a potential emergency and should be followed  up as soon as possible or go to the Emergency Department if any problems should occur.  Please show the CHEMOTHERAPY ALERT CARD or IMMUNOTHERAPY ALERT CARD at check-in to the Emergency Department and triage nurse.  Should you have questions after your visit or need to cancel or reschedule your appointment, please contact [facility] [place] - A DEPT OF [name] [phone]  and follow the prompts.  Office hours are 8:00 a.m. to 4:30 p.m. Monday - Friday. Please note that voicemails left after 4:00 p.m. may not be returned until the following business day.  We are closed weekends and major holidays. You have access to a nurse at all times for urgent questions. Please call the main number to the clinic [phone] and follow the prompts.  For any non-urgent questions, you may also contact your provider using MyChart. We now offer e-Visits for anyone 47 and older to request care online for non-urgent symptoms. For details visit [email or web address].   Also download the MyChart app! Go to the app store, search MyChart, open the app, select [facility], and log in with your MyChart username and password.

## 2024-06-29 NOTE — Progress Notes (Signed)
 Reviewed blood pressures and labs with Dr. Davonna.   See pre treatment flowsheet.   Clonidine  0.2 verbal order Dr. Davonna and ok to treat verbal order Dr. Davonna.    Patient tolerated chemotherapy with no complaints voiced.  Side effects with management reviewed with understanding verbalized.  Peripheral IV site clean and dry with no bruising or swelling noted at site.  Good blood return noted before and after administration of chemotherapy.  Coban aid applied.  Patient left in satisfactory condition with VSS and no s/s of distress noted.

## 2024-06-29 NOTE — Progress Notes (Signed)
 Pharmacist Chemotherapy Monitoring - Initial Assessment    Anticipated start date: 06/29/24   The following has been reviewed per standard work regarding the patient's treatment regimen: The patient's diagnosis, treatment plan and drug doses, and organ/hematologic function Lab orders and baseline tests specific to treatment regimen  The treatment plan start date, drug sequencing, and pre-medications Prior authorization status  Patient's documented medication list, including drug-drug interaction screen and prescriptions for anti-emetics and supportive care specific to the treatment regimen The drug concentrations, fluid compatibility, administration routes, and timing of the medications to be used The patient's access for treatment and lifetime cumulative dose history, if applicable  The patient's medication allergies and previous infusion related reactions, if applicable   Changes made to treatment plan:  Dr Davonna requested:  lab ordered to monitor Daily CMET, Uric Acid, LDH, Mag, with Cladribine .  Adjusted plan to be daily x 5 days per NCCN.  Confirmed dose Dr Davonna wants at 0.15 mg/kg  CD4 Helper = 406 -> no orders at present for Bactrim and Acyclovir  Follow up needed:  N/A   Niels FORBES Molt, [facility], 06/29/2024  9:29 AM

## 2024-06-30 ENCOUNTER — Inpatient Hospital Stay

## 2024-06-30 ENCOUNTER — Other Ambulatory Visit

## 2024-06-30 VITALS — BP 141/76 | HR 57 | Temp 97.8°F | Resp 16

## 2024-06-30 DIAGNOSIS — C9140 Hairy cell leukemia not having achieved remission: Secondary | ICD-10-CM

## 2024-06-30 DIAGNOSIS — Z5111 Encounter for antineoplastic chemotherapy: Secondary | ICD-10-CM | POA: Diagnosis not present

## 2024-06-30 LAB — COMPREHENSIVE METABOLIC PANEL WITH GFR
ALT: 8 U/L (ref 0–44)
AST: 12 U/L — ABNORMAL LOW (ref 15–41)
Albumin: 3.9 g/dL (ref 3.5–5.0)
Alkaline Phosphatase: 41 U/L (ref 38–126)
Anion gap: 11 (ref 5–15)
BUN: 18 mg/dL (ref 8–23)
CO2: 24 mmol/L (ref 22–32)
Calcium: 9 mg/dL (ref 8.9–10.3)
Chloride: 105 mmol/L (ref 98–111)
Creatinine, Ser: 1.24 mg/dL (ref 0.61–1.24)
GFR, Estimated: >60 mL/min (ref >60)
Glucose, Bld: 92 mg/dL (ref 70–99)
Potassium: 4.2 mmol/L (ref 3.5–5.1)
Sodium: 140 mmol/L (ref 135–145)
Total Bilirubin: 0.4 mg/dL (ref 0.0–1.2)
Total Protein: 7.2 g/dL (ref 6.5–8.1)

## 2024-06-30 LAB — LACTATE DEHYDROGENASE: LDH: 104 U/L (ref 98–192)

## 2024-06-30 LAB — MAGNESIUM: Magnesium: 2.1 mg/dL (ref 1.7–2.4)

## 2024-06-30 LAB — URIC ACID: Uric Acid, Serum: 6.2 mg/dL (ref 3.7–8.6)

## 2024-06-30 MED ORDER — SODIUM CHLORIDE 0.9 % IV SOLN
0.1500 mg/kg | Freq: Once | INTRAVENOUS | Status: AC
  Administered 2024-06-30: 14 mg via INTRAVENOUS
  Filled 2024-06-30: qty 14

## 2024-06-30 MED ORDER — PROCHLORPERAZINE MALEATE 10 MG PO TABS
10.0000 mg | ORAL_TABLET | Freq: Once | ORAL | Status: AC
Start: 2024-06-30 — End: 2024-06-30
  Administered 2024-06-30: 10 mg via ORAL
  Filled 2024-06-30: qty 1

## 2024-06-30 MED ORDER — SODIUM CHLORIDE 0.9 % IV SOLN: INTRAVENOUS | Status: DC

## 2024-06-30 NOTE — Patient Instructions (Signed)
 [facility] [place] - A DEPT OF [facility]  Discharge Instructions: Thank you for choosing [facility] to provide your oncology and hematology care.  If you have a lab appointment with the Cancer Center - please note that after April 8th, 2024, all labs will be drawn in the cancer center.  You do not have to check in or register with the main entrance as you have in the past but will complete your check-in in the cancer center.  Wear comfortable clothing and clothing appropriate for easy access to any Portacath or PICC line.   We strive to give you quality time with your provider. You may need to reschedule your appointment if you arrive late (15 or more minutes).  Arriving late affects you and other patients whose appointments are after yours.  Also, if you miss three or more appointments without notifying the office, you may be dismissed from the clinic at the provider's discretion.      For prescription refill requests, have your pharmacy contact our office and allow 72 hours for refills to be completed.    Today you received the following chemotherapy and/or immunotherapy agents Cladribine . Cladribine  Injection What is this medication? CLADRIBINE  (KLA dri been) treats leukemia. It works by slowing down the growth of cancer cells. This medicine may be used for other purposes; ask your health care provider or pharmacist if you have questions. COMMON BRAND NAME(S): Leustatin  What should I tell my care team before I take this medication? They need to know if you have any of these conditions: Bleeding problems Infection, such as chickenpox, cold sores, herpes Kidney disease Liver disease An unusual or allergic reaction to cladribine , benzyl alcohol, other medications, foods, dyes, or preservatives Pregnant or trying to get pregnant Breast-feeding How should I use this medication? This medication is injected into a vein. It is given by your care team in  a hospital or clinic setting. Talk to your care team about the use of this medication in children. While it may be prescribed for children for selected conditions, precautions do apply. Overdosage: If you think you have taken too much of this medicine contact a poison control center or emergency room at once. NOTE: This medicine is only for you. Do not share this medicine with others. What if I miss a dose? Keep appointments for follow-up doses. It is important not to miss your dose. Call your care team if you are unable to keep an appointment. What may interact with this medication? Do not take this medication with any of the following: Live virus vaccines This medication may also interact with the following: NSAIDS, medications for pain and inflammation, such as ibuprofen or naproxen This list may not describe all possible interactions. Give your health care provider a list of all the medicines, herbs, non-prescription drugs, or dietary supplements you use. Also tell them if you smoke, drink alcohol, or use illegal drugs. Some items may interact with your medicine. What should I watch for while using this medication? Your condition will be monitored carefully while you are receiving this medication. This medication may make you feel generally unwell. This is not uncommon as chemotherapy can affect healthy cells as well as cancer cells. Report any side effects. Continue your course of treatment even though you feel ill unless your care team tells you to stop. In some cases, you may be given additional medications to help with side effects. Follow all directions for their use. This  medication may increase your risk of getting an infection. Call your care team for advice if you get a fever, chills, sore throat, or other symptoms of a cold or flu. Do not treat yourself. Try to avoid being around people who are sick. This medication may increase your risk to bruise or bleed. Call your care team if you  notice any unusual bleeding. Be careful brushing or flossing your teeth or using a toothpick because you may get an infection or bleed more easily. If you have any dental work done, tell your dentist you are receiving this medication. Avoid taking medications that contain aspirin, acetaminophen , ibuprofen, naproxen, or ketoprofen unless instructed by your care team. These medications may hide a fever. Talk to your care team if you wish to become pregnant or think you might be pregnant. This medication can cause serious birth defects if taken during pregnancy. A reliable form of contraception is recommended while taking this medication. Talk to your care team about effective forms of contraception. Do not breastfeed while taking this medication. What side effects may I notice from receiving this medication? Side effects that you should report to your care team as soon as possible: Allergic reactions--skin rash, itching, hives, swelling of the face, lips, tongue, or throat Infection--fever, chills, cough, sore throat, wounds that don't heal, pain or trouble when passing urine, general feeling of discomfort or being unwell Low red blood cell level--unusual weakness or fatigue, dizziness, headache, trouble breathing Pain, tingling, or numbness in the hands or feet, muscle weakness, change in vision, confusion or trouble speaking, loss of balance or coordination, trouble walking, seizures Unusual bruising or bleeding Side effects that usually do not require medical attention (report to your care team if they continue or are bothersome): Fatigue Headache Loss of appetite Nausea Pain, redness, or irritation at injection site Vomiting This list may not describe all possible side effects. Call your doctor for medical advice about side effects. You may report side effects to FDA at 1-800-FDA-1088. Where should I keep my medication? This medication is given in a hospital or clinic. It will not be stored at  home. NOTE: This sheet is a summary. It may not cover all possible information. If you have questions about this medicine, talk to your doctor, pharmacist, or health care provider.  2024 Elsevier/Gold Standard (2022-03-06 00:00:00)      To help prevent nausea and vomiting after your treatment, we encourage you to take your nausea medication as directed.  BELOW ARE SYMPTOMS THAT SHOULD BE REPORTED IMMEDIATELY: *FEVER GREATER THAN 100.4 F (38 C) OR HIGHER *CHILLS OR SWEATING *NAUSEA AND VOMITING THAT IS NOT CONTROLLED WITH YOUR NAUSEA MEDICATION *UNUSUAL SHORTNESS OF BREATH *UNUSUAL BRUISING OR BLEEDING *URINARY PROBLEMS (pain or burning when urinating, or frequent urination) *BOWEL PROBLEMS (unusual diarrhea, constipation, pain near the anus) TENDERNESS IN MOUTH AND THROAT WITH OR WITHOUT PRESENCE OF ULCERS (sore throat, sores in mouth, or a toothache) UNUSUAL RASH, SWELLING OR PAIN  UNUSUAL VAGINAL DISCHARGE OR ITCHING   Items with * indicate a potential emergency and should be followed up as soon as possible or go to the Emergency Department if any problems should occur.  Please show the CHEMOTHERAPY ALERT CARD or IMMUNOTHERAPY ALERT CARD at check-in to the Emergency Department and triage nurse.  Should you have questions after your visit or need to cancel or reschedule your appointment, please contact [facility] [place] - A DEPT OF [name] [phone]  and follow the prompts.  Office hours are 8:00 a.m. to 4:30 p.m. Monday - Friday. Please note that voicemails left after 4:00 p.m. may not be returned until the following business day.  We are closed weekends and major holidays. You have access to a nurse at all times for urgent questions. Please call the main number to the clinic [phone] and follow the prompts.  For any non-urgent questions, you may also contact your provider using MyChart. We now offer e-Visits for anyone 96 and older to request care  online for non-urgent symptoms. For details visit [email or web address].   Also download the MyChart app! Go to the app store, search MyChart, open the app, select [place], and log in with your MyChart username and password.

## 2024-06-30 NOTE — Progress Notes (Signed)
 Patient presents today for Cladribine  infusion. Vital signs and labs within parameters for treatment. Patient denies any side effects related to last treatment.   Treatment given today per MD orders. Tolerated infusion without adverse affects. Vital signs stable. No complaints at this time. Discharged from clinic ambulatory in stable condition. Alert and oriented x 3. F/U with [facility] as scheduled.

## 2024-07-01 ENCOUNTER — Inpatient Hospital Stay

## 2024-07-01 VITALS — BP 137/88 | HR 72 | Temp 97.7°F | Resp 18

## 2024-07-01 DIAGNOSIS — Z5111 Encounter for antineoplastic chemotherapy: Secondary | ICD-10-CM | POA: Diagnosis not present

## 2024-07-01 DIAGNOSIS — C9140 Hairy cell leukemia not having achieved remission: Secondary | ICD-10-CM

## 2024-07-01 LAB — COMPREHENSIVE METABOLIC PANEL WITH GFR
ALT: 7 U/L (ref 0–44)
AST: 15 U/L (ref 15–41)
Albumin: 3.8 g/dL (ref 3.5–5.0)
Alkaline Phosphatase: 41 U/L (ref 38–126)
Anion gap: 10 (ref 5–15)
BUN: 16 mg/dL (ref 8–23)
CO2: 23 mmol/L (ref 22–32)
Calcium: 8.5 mg/dL — ABNORMAL LOW (ref 8.9–10.3)
Chloride: 103 mmol/L (ref 98–111)
Creatinine, Ser: 1.38 mg/dL — ABNORMAL HIGH (ref 0.61–1.24)
GFR, Estimated: 57 mL/min — ABNORMAL LOW (ref >60)
Glucose, Bld: 115 mg/dL — ABNORMAL HIGH (ref 70–99)
Potassium: 4.1 mmol/L (ref 3.5–5.1)
Sodium: 136 mmol/L (ref 135–145)
Total Bilirubin: 0.6 mg/dL (ref 0.0–1.2)
Total Protein: 7.2 g/dL (ref 6.5–8.1)

## 2024-07-01 LAB — MAGNESIUM: Magnesium: 1.9 mg/dL (ref 1.7–2.4)

## 2024-07-01 LAB — URIC ACID: Uric Acid, Serum: 6.3 mg/dL (ref 3.7–8.6)

## 2024-07-01 LAB — LACTATE DEHYDROGENASE: LDH: 108 U/L (ref 98–192)

## 2024-07-01 MED ORDER — TRAMADOL HCL 50 MG PO TABS
50.0000 mg | ORAL_TABLET | Freq: Once | ORAL | Status: AC
  Administered 2024-07-01: 50 mg via ORAL
  Filled 2024-07-01: qty 1

## 2024-07-01 MED ORDER — PROCHLORPERAZINE MALEATE 10 MG PO TABS
10.0000 mg | ORAL_TABLET | Freq: Once | ORAL | Status: AC
  Administered 2024-07-01: 10 mg via ORAL
  Filled 2024-07-01: qty 1

## 2024-07-01 MED ORDER — SODIUM CHLORIDE 0.9 % IV SOLN
0.1500 mg/kg | Freq: Once | INTRAVENOUS | Status: AC
  Administered 2024-07-01: 14 mg via INTRAVENOUS
  Filled 2024-07-01: qty 14

## 2024-07-01 MED ORDER — SODIUM CHLORIDE 0.9 % IV SOLN: INTRAVENOUS | Status: DC

## 2024-07-01 NOTE — Progress Notes (Signed)
 Patient tolerated treatment well with no complaints voiced.  Patient left ambulatory in stable condition.  Vital signs stable at discharge.  Follow up as scheduled.

## 2024-07-01 NOTE — Patient Instructions (Signed)
 [facility] [place] - A DEPT OF [place]  Discharge Instructions: Thank you for choosing [facility] to provide your oncology and hematology care.  If you have a lab appointment with the Cancer Center - please note that after April 8th, 2024, all labs will be drawn in the cancer center.  You do not have to check in or register with the main entrance as you have in the past but will complete your check-in in the cancer center.  Wear comfortable clothing and clothing appropriate for easy access to any Portacath or PICC line.   We strive to give you quality time with your provider. You may need to reschedule your appointment if you arrive late (15 or more minutes).  Arriving late affects you and other patients whose appointments are after yours.  Also, if you miss three or more appointments without notifying the office, you may be dismissed from the clinic at the provider's discretion.      For prescription refill requests, have your pharmacy contact our office and allow 72 hours for refills to be completed.    Today you received the following chemotherapy and/or immunotherapy agents Leustatin . Cladribine  Injection What is this medication? CLADRIBINE  (KLA dri been) treats leukemia. It works by slowing down the growth of cancer cells. This medicine may be used for other purposes; ask your health care provider or pharmacist if you have questions. COMMON BRAND NAME(S): Leustatin  What should I tell my care team before I take this medication? They need to know if you have any of these conditions: Bleeding problems Infection, such as chickenpox, cold sores, herpes Kidney disease Liver disease An unusual or allergic reaction to cladribine , benzyl alcohol, other medications, foods, dyes, or preservatives Pregnant or trying to get pregnant Breast-feeding How should I use this medication? This medication is injected into a vein. It is given by your care team in  a hospital or clinic setting. Talk to your care team about the use of this medication in children. While it may be prescribed for children for selected conditions, precautions do apply. Overdosage: If you think you have taken too much of this medicine contact a poison control center or emergency room at once. NOTE: This medicine is only for you. Do not share this medicine with others. What if I miss a dose? Keep appointments for follow-up doses. It is important not to miss your dose. Call your care team if you are unable to keep an appointment. What may interact with this medication? Do not take this medication with any of the following: Live virus vaccines This medication may also interact with the following: NSAIDS, medications for pain and inflammation, such as ibuprofen or naproxen This list may not describe all possible interactions. Give your health care provider a list of all the medicines, herbs, non-prescription drugs, or dietary supplements you use. Also tell them if you smoke, drink alcohol, or use illegal drugs. Some items may interact with your medicine. What should I watch for while using this medication? Your condition will be monitored carefully while you are receiving this medication. This medication may make you feel generally unwell. This is not uncommon as chemotherapy can affect healthy cells as well as cancer cells. Report any side effects. Continue your course of treatment even though you feel ill unless your care team tells you to stop. In some cases, you may be given additional medications to help with side effects. Follow all directions for their use. This  medication may increase your risk of getting an infection. Call your care team for advice if you get a fever, chills, sore throat, or other symptoms of a cold or flu. Do not treat yourself. Try to avoid being around people who are sick. This medication may increase your risk to bruise or bleed. Call your care team if you  notice any unusual bleeding. Be careful brushing or flossing your teeth or using a toothpick because you may get an infection or bleed more easily. If you have any dental work done, tell your dentist you are receiving this medication. Avoid taking medications that contain aspirin, acetaminophen , ibuprofen, naproxen, or ketoprofen unless instructed by your care team. These medications may hide a fever. Talk to your care team if you wish to become pregnant or think you might be pregnant. This medication can cause serious birth defects if taken during pregnancy. A reliable form of contraception is recommended while taking this medication. Talk to your care team about effective forms of contraception. Do not breastfeed while taking this medication. What side effects may I notice from receiving this medication? Side effects that you should report to your care team as soon as possible: Allergic reactions--skin rash, itching, hives, swelling of the face, lips, tongue, or throat Infection--fever, chills, cough, sore throat, wounds that don't heal, pain or trouble when passing urine, general feeling of discomfort or being unwell Low red blood cell level--unusual weakness or fatigue, dizziness, headache, trouble breathing Pain, tingling, or numbness in the hands or feet, muscle weakness, change in vision, confusion or trouble speaking, loss of balance or coordination, trouble walking, seizures Unusual bruising or bleeding Side effects that usually do not require medical attention (report to your care team if they continue or are bothersome): Fatigue Headache Loss of appetite Nausea Pain, redness, or irritation at injection site Vomiting This list may not describe all possible side effects. Call your doctor for medical advice about side effects. You may report side effects to FDA at 1-800-FDA-1088. Where should I keep my medication? This medication is given in a hospital or clinic. It will not be stored at  home. NOTE: This sheet is a summary. It may not cover all possible information. If you have questions about this medicine, talk to your doctor, pharmacist, or health care provider.  2024 Elsevier/Gold Standard (2022-03-06 00:00:00)      To help prevent nausea and vomiting after your treatment, we encourage you to take your nausea medication as directed.  BELOW ARE SYMPTOMS THAT SHOULD BE REPORTED IMMEDIATELY: *FEVER GREATER THAN 100.4 F (38 C) OR HIGHER *CHILLS OR SWEATING *NAUSEA AND VOMITING THAT IS NOT CONTROLLED WITH YOUR NAUSEA MEDICATION *UNUSUAL SHORTNESS OF BREATH *UNUSUAL BRUISING OR BLEEDING *URINARY PROBLEMS (pain or burning when urinating, or frequent urination) *BOWEL PROBLEMS (unusual diarrhea, constipation, pain near the anus) TENDERNESS IN MOUTH AND THROAT WITH OR WITHOUT PRESENCE OF ULCERS (sore throat, sores in mouth, or a toothache) UNUSUAL RASH, SWELLING OR PAIN  UNUSUAL VAGINAL DISCHARGE OR ITCHING   Items with * indicate a potential emergency and should be followed up as soon as possible or go to the Emergency Department if any problems should occur.  Please show the CHEMOTHERAPY ALERT CARD or IMMUNOTHERAPY ALERT CARD at check-in to the Emergency Department and triage nurse.  Should you have questions after your visit or need to cancel or reschedule your appointment, please contact [facility] [place] - A DEPT OF [name] [phone]  and follow the prompts.  Office hours are 8:00 a.m. to 4:30 p.m. Monday - Friday. Please note that voicemails left after 4:00 p.m. may not be returned until the following business day.  We are closed weekends and major holidays. You have access to a nurse at all times for urgent questions. Please call the main number to the clinic [phone] and follow the prompts.  For any non-urgent questions, you may also contact your provider using MyChart. We now offer e-Visits for anyone 64 and older to request care  online for non-urgent symptoms. For details visit [email or web address].   Also download the MyChart app! Go to the app store, search MyChart, open the app, select [facility], and log in with your MyChart username and password.

## 2024-07-01 NOTE — Progress Notes (Signed)
 Increase volume of diluent  for Cladribine  to 500 ml due to slight increase in creatinine for additional fluids.  T.O. Dr Ivery Molt, PharmD

## 2024-07-01 NOTE — Progress Notes (Signed)
 Patient presents today for D3C1 of Leustatin  (Cladribine ). Blood pressure elevated diastolic on arrival. 150/94. Labs pending for treatment. Patient has lower back pain that radiates down both legs bilateral. Rates his pain a 6/10.    Message received from A. Anderson RN / Dr. Davonna give Tramadol  50 mg x 1 dose PO .   Labs for treatment within parameters .   Message received from Dr. Davonna to give 1 liter of normal saline over 1 hour with infusion.   Treatment given today per MD orders. Tolerated infusion without adverse affects. Vital signs stable. No complaints at this time. Discharged from clinic ambulatory in stable condition. Alert and oriented x 3. F/U with [facility] as scheduled.

## 2024-07-02 ENCOUNTER — Other Ambulatory Visit: Payer: Self-pay | Admitting: Oncology

## 2024-07-02 ENCOUNTER — Other Ambulatory Visit

## 2024-07-02 ENCOUNTER — Inpatient Hospital Stay

## 2024-07-02 VITALS — BP 130/88 | HR 79 | Temp 98.2°F | Resp 18

## 2024-07-02 DIAGNOSIS — C9140 Hairy cell leukemia not having achieved remission: Secondary | ICD-10-CM

## 2024-07-02 DIAGNOSIS — Z5111 Encounter for antineoplastic chemotherapy: Secondary | ICD-10-CM | POA: Diagnosis not present

## 2024-07-02 LAB — COMPREHENSIVE METABOLIC PANEL WITH GFR
ALT: 8 U/L (ref 0–44)
AST: 15 U/L (ref 15–41)
Albumin: 3.9 g/dL (ref 3.5–5.0)
Alkaline Phosphatase: 38 U/L (ref 38–126)
Anion gap: 12 (ref 5–15)
BUN: 15 mg/dL (ref 8–23)
CO2: 22 mmol/L (ref 22–32)
Calcium: 8.6 mg/dL — ABNORMAL LOW (ref 8.9–10.3)
Chloride: 101 mmol/L (ref 98–111)
Creatinine, Ser: 1.3 mg/dL — ABNORMAL HIGH (ref 0.61–1.24)
GFR, Estimated: >60 mL/min (ref >60)
Glucose, Bld: 165 mg/dL — ABNORMAL HIGH (ref 70–99)
Potassium: 3.8 mmol/L (ref 3.5–5.1)
Sodium: 135 mmol/L (ref 135–145)
Total Bilirubin: 0.9 mg/dL (ref 0.0–1.2)
Total Protein: 7.2 g/dL (ref 6.5–8.1)

## 2024-07-02 LAB — URIC ACID: Uric Acid, Serum: 6.3 mg/dL (ref 3.7–8.6)

## 2024-07-02 LAB — LACTATE DEHYDROGENASE: LDH: 107 U/L (ref 98–192)

## 2024-07-02 LAB — SAMPLE TO BLOOD BANK

## 2024-07-02 LAB — PREPARE RBC (CROSSMATCH)

## 2024-07-02 LAB — MAGNESIUM: Magnesium: 1.8 mg/dL (ref 1.7–2.4)

## 2024-07-02 MED ORDER — SODIUM CHLORIDE 0.9 % IV SOLN: INTRAVENOUS | Status: DC

## 2024-07-02 MED ORDER — PROCHLORPERAZINE MALEATE 10 MG PO TABS
10.0000 mg | ORAL_TABLET | Freq: Once | ORAL | Status: AC
  Administered 2024-07-02: 10 mg via ORAL
  Filled 2024-07-02: qty 1

## 2024-07-02 MED ORDER — SODIUM CHLORIDE 0.9 % IV SOLN
0.1500 mg/kg | Freq: Once | INTRAVENOUS | Status: AC
  Administered 2024-07-02: 14 mg via INTRAVENOUS
  Filled 2024-07-02: qty 14

## 2024-07-02 NOTE — Progress Notes (Signed)
 Patient presents today for chemotherapy infusion.  Patient is in stable condition.  Patient c/o feeling weak today and continued leg and back pain. Vital signs are stable.  Labs reviewed. We will draw CBC/BBS today per Dr. Davonna.  We will proceed with treatment per MD orders.     Hemoglobin today is 7.7.  We will give 1 unit of PRBC tomorrow with treatment per standing orders by Dr. Davonna.  Consent obtained today.   Patient tolerated treatment well with no complaints voiced.  Patient left ambulatory in stable condition.  Vital signs stable at discharge.  Follow up as scheduled.

## 2024-07-02 NOTE — Progress Notes (Signed)
 CRITICAL VALUE ALERT Critical value received:  WBC 0.8 Date of notification:  07-02-24 Time of notification: 0851 Critical value read back:  Yes.   Nurse who received alert:  C. Jamiyah Dingley RN MD notified time and response:  Dr. Davonna, no new orders at this time

## 2024-07-02 NOTE — Patient Instructions (Signed)
 [facility] [place] - A DEPT OF [place]  Discharge Instructions: Thank you for choosing [facility] to provide your oncology and hematology care.  If you have a lab appointment with the Cancer Center - please note that after April 8th, 2024, all labs will be drawn in the cancer center.  You do not have to check in or register with the main entrance as you have in the past but will complete your check-in in the cancer center.  Wear comfortable clothing and clothing appropriate for easy access to any Portacath or PICC line.   We strive to give you quality time with your provider. You may need to reschedule your appointment if you arrive late (15 or more minutes).  Arriving late affects you and other patients whose appointments are after yours.  Also, if you miss three or more appointments without notifying the office, you may be dismissed from the clinic at the provider's discretion.      For prescription refill requests, have your pharmacy contact our office and allow 72 hours for refills to be completed.    Today you received the following chemotherapy and/or immunotherapy agents Cladribine .  Cladribine  Injection What is this medication? CLADRIBINE  (KLA dri been) treats leukemia. It works by slowing down the growth of cancer cells. This medicine may be used for other purposes; ask your health care provider or pharmacist if you have questions. COMMON BRAND NAME(S): Leustatin  What should I tell my care team before I take this medication? They need to know if you have any of these conditions: Bleeding problems Infection, such as chickenpox, cold sores, herpes Kidney disease Liver disease An unusual or allergic reaction to cladribine , benzyl alcohol, other medications, foods, dyes, or preservatives Pregnant or trying to get pregnant Breast-feeding How should I use this medication? This medication is injected into a vein. It is given by your care team  in a hospital or clinic setting. Talk to your care team about the use of this medication in children. While it may be prescribed for children for selected conditions, precautions do apply. Overdosage: If you think you have taken too much of this medicine contact a poison control center or emergency room at once. NOTE: This medicine is only for you. Do not share this medicine with others. What if I miss a dose? Keep appointments for follow-up doses. It is important not to miss your dose. Call your care team if you are unable to keep an appointment. What may interact with this medication? Do not take this medication with any of the following: Live virus vaccines This medication may also interact with the following: NSAIDS, medications for pain and inflammation, such as ibuprofen or naproxen This list may not describe all possible interactions. Give your health care provider a list of all the medicines, herbs, non-prescription drugs, or dietary supplements you use. Also tell them if you smoke, drink alcohol, or use illegal drugs. Some items may interact with your medicine. What should I watch for while using this medication? Your condition will be monitored carefully while you are receiving this medication. This medication may make you feel generally unwell. This is not uncommon as chemotherapy can affect healthy cells as well as cancer cells. Report any side effects. Continue your course of treatment even though you feel ill unless your care team tells you to stop. In some cases, you may be given additional medications to help with side effects. Follow all directions for their use.  This medication may increase your risk of getting an infection. Call your care team for advice if you get a fever, chills, sore throat, or other symptoms of a cold or flu. Do not treat yourself. Try to avoid being around people who are sick. This medication may increase your risk to bruise or bleed. Call your care team if  you notice any unusual bleeding. Be careful brushing or flossing your teeth or using a toothpick because you may get an infection or bleed more easily. If you have any dental work done, tell your dentist you are receiving this medication. Avoid taking medications that contain aspirin, acetaminophen , ibuprofen, naproxen, or ketoprofen unless instructed by your care team. These medications may hide a fever. Talk to your care team if you wish to become pregnant or think you might be pregnant. This medication can cause serious birth defects if taken during pregnancy. A reliable form of contraception is recommended while taking this medication. Talk to your care team about effective forms of contraception. Do not breastfeed while taking this medication. What side effects may I notice from receiving this medication? Side effects that you should report to your care team as soon as possible: Allergic reactions--skin rash, itching, hives, swelling of the face, lips, tongue, or throat Infection--fever, chills, cough, sore throat, wounds that don't heal, pain or trouble when passing urine, general feeling of discomfort or being unwell Low red blood cell level--unusual weakness or fatigue, dizziness, headache, trouble breathing Pain, tingling, or numbness in the hands or feet, muscle weakness, change in vision, confusion or trouble speaking, loss of balance or coordination, trouble walking, seizures Unusual bruising or bleeding Side effects that usually do not require medical attention (report to your care team if they continue or are bothersome): Fatigue Headache Loss of appetite Nausea Pain, redness, or irritation at injection site Vomiting This list may not describe all possible side effects. Call your doctor for medical advice about side effects. You may report side effects to FDA at 1-800-FDA-1088. Where should I keep my medication? This medication is given in a hospital or clinic. It will not be  stored at home. NOTE: This sheet is a summary. It may not cover all possible information. If you have questions about this medicine, talk to your doctor, pharmacist, or health care provider.  2024 Elsevier/Gold Standard (2022-03-06 00:00:00)       To help prevent nausea and vomiting after your treatment, we encourage you to take your nausea medication as directed.  BELOW ARE SYMPTOMS THAT SHOULD BE REPORTED IMMEDIATELY: *FEVER GREATER THAN 100.4 F (38 C) OR HIGHER *CHILLS OR SWEATING *NAUSEA AND VOMITING THAT IS NOT CONTROLLED WITH YOUR NAUSEA MEDICATION *UNUSUAL SHORTNESS OF BREATH *UNUSUAL BRUISING OR BLEEDING *URINARY PROBLEMS (pain or burning when urinating, or frequent urination) *BOWEL PROBLEMS (unusual diarrhea, constipation, pain near the anus) TENDERNESS IN MOUTH AND THROAT WITH OR WITHOUT PRESENCE OF ULCERS (sore throat, sores in mouth, or a toothache) UNUSUAL RASH, SWELLING OR PAIN  UNUSUAL VAGINAL DISCHARGE OR ITCHING   Items with * indicate a potential emergency and should be followed up as soon as possible or go to the Emergency Department if any problems should occur.  Please show the CHEMOTHERAPY ALERT CARD or IMMUNOTHERAPY ALERT CARD at check-in to the Emergency Department and triage nurse.  Should you have questions after your visit or need to cancel or reschedule your appointment, please contact [facility] [place] - A DEPT OF [name] [phone]  and follow  the prompts.  Office hours are 8:00 a.m. to 4:30 p.m. Monday - Friday. Please note that voicemails left after 4:00 p.m. may not be returned until the following business day.  We are closed weekends and major holidays. You have access to a nurse at all times for urgent questions. Please call the main number to the clinic [phone] and follow the prompts.  For any non-urgent questions, you may also contact your provider using MyChart. We now offer e-Visits for anyone 34 and older to  request care online for non-urgent symptoms. For details visit [email or web address].   Also download the MyChart app! Go to the app store, search MyChart, open the app, select [place], and log in with your MyChart username and password.

## 2024-07-02 NOTE — Progress Notes (Signed)
 Patient tolerated treatment well with no complaints voiced.  Patient left ambulatory in stable condition.  Vital signs stable at discharge. Patient returns tomorrow for PRBC transfusion. Blue bracelet on, patient verbalized understanding of keeping blue bracelet on.

## 2024-07-03 ENCOUNTER — Other Ambulatory Visit

## 2024-07-03 ENCOUNTER — Inpatient Hospital Stay

## 2024-07-03 VITALS — BP 164/94 | HR 69 | Temp 98.2°F | Resp 18

## 2024-07-03 DIAGNOSIS — C9140 Hairy cell leukemia not having achieved remission: Secondary | ICD-10-CM

## 2024-07-03 DIAGNOSIS — Z5111 Encounter for antineoplastic chemotherapy: Secondary | ICD-10-CM | POA: Diagnosis not present

## 2024-07-03 LAB — LACTATE DEHYDROGENASE: LDH: 102 U/L (ref 98–192)

## 2024-07-03 LAB — COMPREHENSIVE METABOLIC PANEL WITH GFR
ALT: 8 U/L (ref 0–44)
AST: 15 U/L (ref 15–41)
Albumin: 3.7 g/dL (ref 3.5–5.0)
Alkaline Phosphatase: 36 U/L — ABNORMAL LOW (ref 38–126)
Anion gap: 8 (ref 5–15)
BUN: 19 mg/dL (ref 8–23)
CO2: 24 mmol/L (ref 22–32)
Calcium: 8.5 mg/dL — ABNORMAL LOW (ref 8.9–10.3)
Chloride: 104 mmol/L (ref 98–111)
Creatinine, Ser: 1.3 mg/dL — ABNORMAL HIGH (ref 0.61–1.24)
GFR, Estimated: >60 mL/min (ref >60)
Glucose, Bld: 141 mg/dL — ABNORMAL HIGH (ref 70–99)
Potassium: 3.8 mmol/L (ref 3.5–5.1)
Sodium: 136 mmol/L (ref 135–145)
Total Bilirubin: 0.6 mg/dL (ref 0.0–1.2)
Total Protein: 7.2 g/dL (ref 6.5–8.1)

## 2024-07-03 LAB — URIC ACID: Uric Acid, Serum: 7.6 mg/dL (ref 3.7–8.6)

## 2024-07-03 LAB — MAGNESIUM: Magnesium: 1.9 mg/dL (ref 1.7–2.4)

## 2024-07-03 MED ORDER — PROCHLORPERAZINE MALEATE 10 MG PO TABS
10.0000 mg | ORAL_TABLET | Freq: Once | ORAL | Status: AC
  Administered 2024-07-03: 10 mg via ORAL
  Filled 2024-07-03: qty 1

## 2024-07-03 MED ORDER — SODIUM CHLORIDE 0.9 % IV SOLN
0.1500 mg/kg | Freq: Once | INTRAVENOUS | Status: AC
  Administered 2024-07-03: 14 mg via INTRAVENOUS
  Filled 2024-07-03: qty 14

## 2024-07-03 MED ORDER — SODIUM CHLORIDE 0.9 % IV SOLN: INTRAVENOUS | Status: DC

## 2024-07-03 MED ORDER — DIPHENHYDRAMINE HCL 25 MG PO CAPS
25.0000 mg | ORAL_CAPSULE | Freq: Once | ORAL | Status: AC
  Administered 2024-07-03: 25 mg via ORAL
  Filled 2024-07-03: qty 1

## 2024-07-03 MED ORDER — ACETAMINOPHEN 325 MG PO TABS: 650.0000 mg | ORAL_TABLET | Freq: Once | ORAL | Status: DC

## 2024-07-03 MED ORDER — SODIUM CHLORIDE 0.9% IV SOLUTION
250.0000 mL | INTRAVENOUS | Status: DC
  Administered 2024-07-03: 250 mL via INTRAVENOUS

## 2024-07-03 NOTE — Progress Notes (Signed)
 Patient presents today for 1 unit of blood and D5 of Cladribine . Patient allergic to tylenol , okay to proceed with blood with only Benadryl  per Dr. Davonna. Patient reports that he started getting cold last night around 10 and had the cold sweats and was aching, he thinks he might have had a fever. No fever this am, labs within treatment parameters. Okay to proceed with treatment per Dr. Davonna.  Patient tolerated blood transfusion and chemotherapy with no complaints voiced. Side effects with management reviewed understanding verbalized. IV site clean and dry with no bruising or swelling noted at site. Good blood return noted before and after administration of chemotherapy. Band aid applied. Patient left in satisfactory condition with VSS and no s/s of distress noted.

## 2024-07-03 NOTE — Patient Instructions (Signed)
 [facility] [place] - A DEPT OF [place]  Discharge Instructions: Thank you for choosing [facility] to provide your oncology and hematology care.  If you have a lab appointment with the Cancer Center - please note that after April 8th, 2024, all labs will be drawn in the cancer center.  You do not have to check in or register with the main entrance as you have in the past but will complete your check-in in the cancer center.  Wear comfortable clothing and clothing appropriate for easy access to any Portacath or PICC line.   We strive to give you quality time with your provider. You may need to reschedule your appointment if you arrive late (15 or more minutes).  Arriving late affects you and other patients whose appointments are after yours.  Also, if you miss three or more appointments without notifying the office, you may be dismissed from the clinic at the provider's discretion.      For prescription refill requests, have your pharmacy contact our office and allow 72 hours for refills to be completed.    Today you received the following chemotherapy and/or immunotherapy agents 1 unit of blood and Cladribine , return as scheduled.   To help prevent nausea and vomiting after your treatment, we encourage you to take your nausea medication as directed.  BELOW ARE SYMPTOMS THAT SHOULD BE REPORTED IMMEDIATELY: *FEVER GREATER THAN 100.4 F (38 C) OR HIGHER *CHILLS OR SWEATING *NAUSEA AND VOMITING THAT IS NOT CONTROLLED WITH YOUR NAUSEA MEDICATION *UNUSUAL SHORTNESS OF BREATH *UNUSUAL BRUISING OR BLEEDING *URINARY PROBLEMS (pain or burning when urinating, or frequent urination) *BOWEL PROBLEMS (unusual diarrhea, constipation, pain near the anus) TENDERNESS IN MOUTH AND THROAT WITH OR WITHOUT PRESENCE OF ULCERS (sore throat, sores in mouth, or a toothache) UNUSUAL RASH, SWELLING OR PAIN  UNUSUAL VAGINAL DISCHARGE OR ITCHING   Items with * indicate a  potential emergency and should be followed up as soon as possible or go to the Emergency Department if any problems should occur.  Please show the CHEMOTHERAPY ALERT CARD or IMMUNOTHERAPY ALERT CARD at check-in to the Emergency Department and triage nurse.  Should you have questions after your visit or need to cancel or reschedule your appointment, please contact [facility] [place] - A DEPT OF [name] [phone]  and follow the prompts.  Office hours are 8:00 a.m. to 4:30 p.m. Monday - Friday. Please note that voicemails left after 4:00 p.m. may not be returned until the following business day.  We are closed weekends and major holidays. You have access to a nurse at all times for urgent questions. Please call the main number to the clinic [phone] and follow the prompts.  For any non-urgent questions, you may also contact your provider using MyChart. We now offer e-Visits for anyone 65 and older to request care online for non-urgent symptoms. For details visit [email or web address].   Also download the MyChart app! Go to the app store, search MyChart, open the app, select [place], and log in with your MyChart username and password.

## 2024-07-04 LAB — TYPE AND SCREEN
ABO/RH(D): A POS
Antibody Screen: NEGATIVE
Unit division: 0

## 2024-07-04 LAB — BPAM RBC
Blood Product Expiration Date: 202509112359
ISSUE DATE / TIME: 202508220849
Unit Type and Rh: 6200

## 2024-07-06 LAB — CBC WITH DIFFERENTIAL/PLATELET
Abs Granulocyte: 0.5 K/uL — CL (ref 1.5–6.5)
Abs Immature Granulocytes: 0 K/uL (ref 0.00–0.07)
Basophils Absolute: 0 K/uL (ref 0.0–0.1)
Basophils Relative: 0 %
Eosinophils Absolute: 0 K/uL (ref 0.0–0.5)
Eosinophils Relative: 1 %
HCT: 23.4 % — ABNORMAL LOW (ref 39.0–52.0)
Hemoglobin: 7.7 g/dL — ABNORMAL LOW (ref 13.0–17.0)
Immature Granulocytes: 0 %
Lymphocytes Relative: 30 %
Lymphs Abs: 0.2 K/uL — ABNORMAL LOW (ref 0.7–4.0)
MCH: 35.8 pg — ABNORMAL HIGH (ref 26.0–34.0)
MCHC: 32.9 g/dL (ref 30.0–36.0)
MCV: 108.8 fL — ABNORMAL HIGH (ref 80.0–100.0)
Monocytes Absolute: 0 K/uL — ABNORMAL LOW (ref 0.1–1.0)
Monocytes Relative: 0 %
Neutro Abs: 0.5 K/uL — ABNORMAL LOW (ref 1.7–7.7)
Neutrophils Relative %: 69 %
Platelets: 88 K/uL — ABNORMAL LOW (ref 150–400)
RBC: 2.15 MIL/uL — ABNORMAL LOW (ref 4.22–5.81)
RDW: 17 % — ABNORMAL HIGH (ref 11.5–15.5)
WBC: 0.8 K/uL — CL (ref 4.0–10.5)
nRBC: 0 % (ref 0.0–0.2)

## 2024-07-06 NOTE — Progress Notes (Signed)
 24 HOUR CHEMOTHERAPY CALL BACK:  Left message for patient.

## 2024-07-07 ENCOUNTER — Other Ambulatory Visit: Payer: Self-pay

## 2024-07-07 DIAGNOSIS — D61818 Other pancytopenia: Secondary | ICD-10-CM

## 2024-07-07 DIAGNOSIS — C9140 Hairy cell leukemia not having achieved remission: Secondary | ICD-10-CM

## 2024-07-08 ENCOUNTER — Inpatient Hospital Stay

## 2024-07-08 DIAGNOSIS — C9140 Hairy cell leukemia not having achieved remission: Secondary | ICD-10-CM

## 2024-07-08 DIAGNOSIS — Z5111 Encounter for antineoplastic chemotherapy: Secondary | ICD-10-CM | POA: Diagnosis not present

## 2024-07-08 DIAGNOSIS — D61818 Other pancytopenia: Secondary | ICD-10-CM

## 2024-07-08 LAB — CBC WITH DIFFERENTIAL/PLATELET
Abs Granulocyte: 0.2 K/uL — CL (ref 1.5–6.5)
Abs Immature Granulocytes: 0 K/uL (ref 0.00–0.07)
Basophils Absolute: 0 K/uL (ref 0.0–0.1)
Basophils Relative: 0 %
Eosinophils Absolute: 0 K/uL (ref 0.0–0.5)
Eosinophils Relative: 13 %
HCT: 25 % — ABNORMAL LOW (ref 39.0–52.0)
Hemoglobin: 8.2 g/dL — ABNORMAL LOW (ref 13.0–17.0)
Immature Granulocytes: 0 %
Lymphocytes Relative: 21 %
Lymphs Abs: 0.1 K/uL — ABNORMAL LOW (ref 0.7–4.0)
MCH: 34.3 pg — ABNORMAL HIGH (ref 26.0–34.0)
MCHC: 32.8 g/dL (ref 30.0–36.0)
MCV: 104.6 fL — ABNORMAL HIGH (ref 80.0–100.0)
Monocytes Absolute: 0 K/uL — ABNORMAL LOW (ref 0.1–1.0)
Monocytes Relative: 4 %
Neutro Abs: 0.2 K/uL — CL (ref 1.7–7.7)
Neutrophils Relative %: 62 %
Platelets: 93 K/uL — ABNORMAL LOW (ref 150–400)
RBC: 2.39 MIL/uL — ABNORMAL LOW (ref 4.22–5.81)
RDW: 16.4 % — ABNORMAL HIGH (ref 11.5–15.5)
WBC: 0.2 K/uL — CL (ref 4.0–10.5)
nRBC: 0 % (ref 0.0–0.2)

## 2024-07-08 LAB — COMPREHENSIVE METABOLIC PANEL WITH GFR
ALT: 10 U/L (ref 0–44)
AST: 11 U/L — ABNORMAL LOW (ref 15–41)
Albumin: 3.7 g/dL (ref 3.5–5.0)
Alkaline Phosphatase: 35 U/L — ABNORMAL LOW (ref 38–126)
Anion gap: 11 (ref 5–15)
BUN: 26 mg/dL — ABNORMAL HIGH (ref 8–23)
CO2: 23 mmol/L (ref 22–32)
Calcium: 8.6 mg/dL — ABNORMAL LOW (ref 8.9–10.3)
Chloride: 103 mmol/L (ref 98–111)
Creatinine, Ser: 1.3 mg/dL — ABNORMAL HIGH (ref 0.61–1.24)
GFR, Estimated: >60 mL/min (ref >60)
Glucose, Bld: 107 mg/dL — ABNORMAL HIGH (ref 70–99)
Potassium: 4.4 mmol/L (ref 3.5–5.1)
Sodium: 137 mmol/L (ref 135–145)
Total Bilirubin: 1 mg/dL (ref 0.0–1.2)
Total Protein: 7.5 g/dL (ref 6.5–8.1)

## 2024-07-08 LAB — LACTATE DEHYDROGENASE: LDH: 97 U/L — ABNORMAL LOW (ref 98–192)

## 2024-07-08 NOTE — Progress Notes (Signed)
 CRITICAL VALUE ALERT Critical value received:  ANC 0.2, WBC 0.2.  Date of notification:  07/08/2024 Time of notification: 10:40 Critical value read back:  Yes.   Nurse who received alert:  B.Rosenda Geffrard Rn.  MD notified time and response:  Dr. Davonna / A.Anderson RN @ 10:43 am.

## 2024-07-08 NOTE — Progress Notes (Unsigned)
 CRITICAL VALUE ALERT Critical value received:  ANC 0.2, WBC 0.2.  Date of notification:  07/08/2024 Time of notification: 10:40 Critical value read back:  Yes.   Nurse who received alert:  B.Rosenda Geffrard Rn.  MD notified time and response:  Dr. Davonna / A.Anderson RN @ 10:43 am.

## 2024-07-15 NOTE — Assessment & Plan Note (Addendum)
 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  LDH: Normal, CBC: WBC: 0.4, ANC: 300, hemoglobin: 7.2, platelets: 111.  Absolute CD4 cells: 406 (06/24/24).  CMV quant was negative. -Patient has positive T-cell rearrangement.  Even though it does not have treatment implications, studies have shown that it can carry poor prognosis. - Will assess for complete hematologic response and repeat bone marrow biopsy 4 months after completion of treatment.  - Complete response: Hemoglobin greater than 11, platelets greater than 100, ANC greater than 1500.  Absence of morphologic evidence of LGL on both peripheral blood smear and the bone marrow exam.  Return to clinic in 1 month for follow up with labs.

## 2024-07-15 NOTE — Progress Notes (Signed)
 Patient Care Team: Shona Norleen PEDLAR, MD as PCP - General (Internal Medicine)  Clinic Day:  07/16/2024  Referring physician: Shona Norleen PEDLAR, MD   CHIEF COMPLAINT:  CC: Hairy cell leukemia.     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  LDH: Normal, CBC: WBC: 0.4, ANC: 300, hemoglobin: 7.2, platelets: 111.  Absolute CD4 cells: 406 (06/24/24).  CMV quant was negative. -Patient has positive T-cell rearrangement.  Even though it does not have treatment implications, studies have shown that it can carry poor prognosis. - Will assess for complete hematologic response and repeat bone marrow biopsy 4 months after completion of treatment.  - Complete response: Hemoglobin greater than 11, platelets greater than 100, ANC greater than 1500.  Absence of morphologic evidence of LGL on both peripheral blood smear and the bone marrow exam.  Return to clinic in 1 month for follow up with labs.  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  Will transfuse 2 units of blood today - Will continue monitoring closely - Will assess for response at 4 months from cladribine  infusion  Will recheck labs in 2 weeks and transfuse as needed. Tobacco use Patient is a chronic smoker.  Smokes at least a pack per day Has not smoked for almost a month now  - Continue using nicotine  patch    The  patient understands the plans discussed today and is in agreement with them.  He knows to contact our office if he develops concerns prior to his next appointment.  30 minutes of total time was spent for this patient encounter, including preparation, face-to-face counseling with the patient and coordination of care, physical exam, and documentation of the encounter. > 50% of the time was spent on counseling as documented under my assessment and plan.    LILLETTE Verneta SAUNDERS Teague,acting as a Neurosurgeon for Logan Dry, MD.,have documented all relevant documentation on the behalf of Logan Dry, MD,as directed by  Logan Dry, MD while in the presence of Logan Dry, MD.  I, Logan Dry MD, have reviewed the above documentation for accuracy and completeness, and I agree with the above.     Logan Dry, MD  [facility] [place] - A DEPT OF [facility] [address] [place] [address] Dept: [phone] Dept Fax: [phone]   Orders Placed This Encounter  Procedures   CBC with Differential    Standing Status:   Future    Expected Date:   07/30/2024    Expiration Date:   10/28/2024   CBC with Differential    Standing Status:   Future    Expected Date:   08/13/2024    Expiration Date:   11/11/2024   Comprehensive metabolic panel    Standing Status:   Future    Expected Date:   08/13/2024    Expiration Date:   11/11/2024   Sample to Blood Bank(Blood  Bank Hold)    Standing Status:   Future    Expected Date:   07/30/2024    Expiration Date:   10/28/2024   Sample to Blood Bank(Blood Bank Hold)    Standing Status:   Future    Expected Date:   08/13/2024    Expiration Date:   11/11/2024     ONCOLOGY HISTORY:   I have reviewed his chart and materials related to his cancer extensively and collaborated history with the patient. Summary of oncologic history is as follows:   Hairy Cell Leukemia:  -02/102022: Was being  seen for pancytopenia.  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  There does appear to be some associated fibrosis which raises the possibility of hairy cell leukemia.  Some neoplastic lymphocytes expresses cyclin D1.  BCL6, CCND1/IgH t(11;14), IgH/BCL2 t(14;18), MALT1 (18q21): Not detected. Normal male karyotype. -10/12/2021: PET/CT: Splenomegaly.  Mild diffuse uptake throughout the spleen is noted again with SUV max of 3 point 0.6.  This is compared with liver activity of 2.89.  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  Mild, nonspecific uptake is also identified throughout the bone marrow of the axial and proximal appendicular skeleton.  The SUV max ranges between 3.05 and 2.3 are on background liver activity.  Mild uptake, above background liver activity, is noted within the porta hepatis region with an SUV max of 3.71.  No adenopathy or mass identified within the neck chest, abdomen or pelvis -04/09/2024: Peripheral blood flow cytometry: 0.5 to 1% of lymphocytes kappa restricted B cells with hairy cell immunophenotype (positive for CD103/CD11c) -04/09/2024: Myeloid NGS: No clinically significant variants identified -05/29/2024: Bone marrow biopsy: Flow cytometry: 40% of lymphocytes are T cells with loss of CD7 and coexpressing CD8.  8% of the lymphocytes are B cells with hairy cell leukemia immunophenotype(CD11c and CD103 positive/kappa restricted)  -Pathology: Suboptimal marrow with involvement by a B-cell population(essentially entire available marrow) consistent with hairy cell leukemia.  This is a limited biopsy with extensive population of CD20 positive B cells which coexpress cyclin D1 but are negative for CD5 and CD10. -06/09/2024: T cell receptor gene arrangement: Positive -06/09/2024: BRAF V600E: Positive/detected -06/18/2024:Persistent  subcentimeter mediastinal lymph node anterior to SVC with mild FDG uptake, stable to prior. No new lymphadenopathy. (Deauville score3).Splenomegaly with diffuse FDG uptake, stable to prior. A new non FDG avid nodule in right lung base. -06/29/2024-07/03/2024: Completed 5 days of cladribine   Current Treatment:  Completed 5 days of cladribine   INTERVAL HISTORY:   Carlis Blanchard is here today for follow up after completing 5 days of cladribine .  He experiences fatigue but denies dizziness or shortness of breath. He gets tired quickly but can still perform daily activities around the house.  He notices a bad smell in his urine but denies any burning, pain, or increased frequency of urination.  His appetite is good with no bloating or other gastrointestinal complaints.  I have reviewed the past medical history, past surgical history, social history and family history with the patient and they are unchanged from previous note.  ALLERGIES:  is allergic to ibuprofen, acetaminophen , aspirin, naproxen, and penicillins.  MEDICATIONS:  Current Outpatient Medications  Medication Sig Dispense Refill   albuterol (VENTOLIN HFA) 108 (90 Base) MCG/ACT inhaler Inhale 1-2 puffs into the lungs every 4 (four) hours as needed for wheezing or shortness of breath.     CLADRIBINE  IV Inject into the  vein once a week. Days 1-5 x 1 cycle     Cyanocobalamin  (B-12 COMPLIANCE INJECTION) 1000 MCG/ML KIT Inject as directed every 30 (thirty) days.     hydrocortisone cream 1 % Apply 1 application topically 4 (four) times daily as needed for itching.     lidocaine -prilocaine  (EMLA ) cream Apply to affected area once 30 g 3   neomycin-bacitracin-polymyxin (NEOSPORIN) ophthalmic ointment Place 1 application into both eyes 4 (four) times daily as needed (styes).     nicotine  (NICODERM CQ ) 14 mg/24hr patch Place 1 patch (14 mg total) onto the skin daily. 28 patch 1   ondansetron  (ZOFRAN ) 8 MG tablet Take 1 tablet (8 mg total) by  mouth every 8 (eight) hours as needed for nausea or vomiting. 30 tablet 1   prochlorperazine  (COMPAZINE ) 10 MG tablet Take 1 tablet (10 mg total) by mouth every 6 (six) hours as needed for nausea or vomiting. 30 tablet 1   rizatriptan (MAXALT) 5 MG tablet Take 5 mg by mouth daily as needed for migraine.     sildenafil (VIAGRA) 100 MG tablet Take 100 mg by mouth daily as needed for erectile dysfunction.     tiZANidine (ZANAFLEX) 4 MG tablet Take 4 mg by mouth every 6 (six) hours as needed for muscle spasms.  1   traMADol  (ULTRAM ) 50 MG tablet Take 50 mg by mouth every 6 (six) hours as needed for moderate pain.     No current facility-administered medications for this visit.   Facility-Administered Medications Ordered in Other Visits  Medication Dose Route Frequency Provider Last Rate Last Admin   0.9 %  sodium chloride  infusion (Manually program via Guardrails IV Fluids)  250 mL Intravenous Continuous Davonna Siad, MD 10 mL/hr at 07/16/24 0937 100 mL at 07/16/24 9062   cyanocobalamin  ((VITAMIN B-12)) injection 1,000 mcg  1,000 mcg Intramuscular Once Rogers Hai, MD       cyanocobalamin  ((VITAMIN B-12)) injection 1,000 mcg  1,000 mcg Intramuscular Once Katragadda, Sreedhar, MD        REVIEW OF SYSTEMS:   Constitutional: Denies fevers, chills or abnormal weight loss Eyes: Denies blurriness of vision Ears, nose, mouth, throat, and face: Denies mucositis or sore throat Respiratory: Denies cough, dyspnea or wheezes Cardiovascular: Denies palpitation, chest discomfort or lower extremity swelling Gastrointestinal:  Denies nausea, heartburn or change in bowel habits Skin: Denies abnormal skin rashes Lymphatics: Denies new lymphadenopathy or easy bruising Neurological:Denies numbness, tingling or new weaknesses Behavioral/Psych: Mood is stable, no new changes  All other systems were reviewed with the patient and are negative.   VITALS:  Blood pressure (!) 141/89, pulse (!) 51,  temperature (!) 96.6 F (35.9 C), temperature source Tympanic, resp. rate 18, weight 202 lb 6.4 oz (91.8 kg), SpO2 100%.  Wt Readings from Last 3 Encounters:  07/16/24 202 lb 6.4 oz (91.8 kg)  06/29/24 203 lb 6.4 oz (92.3 kg)  06/24/24 202 lb 12.8 oz (92 kg)    Body mass index is 25.99 kg/m.  Performance status (ECOG): 0 - Asymptomatic  PHYSICAL EXAM:   GENERAL:alert, no distress and comfortable SKIN: skin color, texture, turgor are normal, no rashes or significant lesions NECK: supple, thyroid  normal size, non-tender, without nodularity LYMPH:  no palpable lymphadenopathy in the cervical, axillary or inguinal LUNGS: clear to auscultation and percussion with normal breathing effort HEART: regular rate & rhythm and no murmurs and no lower extremity edema ABDOMEN:abdomen soft, non-tender and normal bowel sounds Musculoskeletal:no cyanosis of digits and no clubbing  NEURO: alert &  oriented x 3 with fluent speech, no focal motor/sensory deficits  LABORATORY DATA:  I have reviewed the data as listed    Component Value Date/Time   NA 139 07/16/2024 0738   K 4.0 07/16/2024 0738   CL 106 07/16/2024 0738   CO2 26 07/16/2024 0738   GLUCOSE 112 (H) 07/16/2024 0738   BUN 23 07/16/2024 0738   CREATININE 1.21 07/16/2024 0738   CALCIUM 8.4 (L) 07/16/2024 0738   PROT 6.6 07/16/2024 0738   ALBUMIN 3.3 (L) 07/16/2024 0738   AST 13 (L) 07/16/2024 0738   ALT 10 07/16/2024 0738   ALKPHOS 32 (L) 07/16/2024 0738   BILITOT 0.6 07/16/2024 0738   GFRNONAA >60 07/16/2024 0738   GFRAA >60 01/19/2019 2141    Lab Results  Component Value Date   WBC 0.4 (LL) 07/16/2024   NEUTROABS 0.3 (LL) 07/16/2024   HGB 7.2 (L) 07/16/2024   HCT 22.4 (L) 07/16/2024   MCV 104.7 (H) 07/16/2024   PLT 111 (L) 07/16/2024      Chemistry      Component Value Date/Time   NA 139 07/16/2024 0738   K 4.0 07/16/2024 0738   CL 106 07/16/2024 0738   CO2 26 07/16/2024 0738   BUN 23 07/16/2024 0738   CREATININE  1.21 07/16/2024 0738      Component Value Date/Time   CALCIUM 8.4 (L) 07/16/2024 0738   ALKPHOS 32 (L) 07/16/2024 0738   AST 13 (L) 07/16/2024 0738   ALT 10 07/16/2024 0738   BILITOT 0.6 07/16/2024 0738      Latest Reference Range & Units 06/24/24 09:04 06/24/24 09:10  CMV DNA, Qual PCR Negative  Negative   CMV Quant DNA PCR (Plasma) Negative IU/mL  Negative  Log10 CMV Qn DNA Pl log10 IU/mL  UNABLE TO CALCULATE  Hep A Ab, IgM NON REACTIVE  NON REACTIVE   Hepatitis B Surface Ag NON REACTIVE  NON REACTIVE   Hep B Core Ab, IgM NON REACTIVE  NON REACTIVE   HCV Ab NON REACTIVE  Reactive !   !: Data is abnormal   Latest Reference Range & Units 06/24/24 09:10  CD3+CD4+ Cells/CD3+CD8+ Cells Bld 0.92 - 3.72  0.35 (L)  CD3+CD8+ Cells # Bld 109 - 897 /uL 1,168 (H)  CD3+CD8+ Cells NFr Bld 12.0 - 35.5 % 73.0 (H)  Absolute CD 4 Helper 359 - 1,519 /uL 406  % CD 4 Pos. Lymph. 30.8 - 58.5 % 25.4 (L)  (L): Data is abnormally low (H): Data is abnormally high   Latest Reference Range & Units 07/16/24 07:38  LDH 98 - 192 U/L 108   RADIOGRAPHIC STUDIES: I have personally reviewed the radiological images as listed and agreed with the findings in the report.  NM PET Image Restage (PS) Skull Base to Thigh (F-18 FDG) CLINICAL DATA:  Subsequent treatment strategy for non-Hodgkin B-cell lymphoma, pancytopenia  EXAM: NUCLEAR MEDICINE PET SKULL BASE TO THIGH  TECHNIQUE: 10.55 mCi F-18 FDG was injected intravenously. Full-ring PET imaging was performed from the skull base to thigh after the radiotracer. CT data was obtained and used for attenuation correction and anatomic localization.  Fasting blood glucose: 120 mg/dl  COMPARISON:  PET-CT October 12, 2021  FINDINGS: Mediastinal blood pool activity: SUV max 2  Liver activity: SUV max 2.9  NECK: Unremarkable  Incidental CT findings: None.  CHEST: Prevascular subcentimeter lymph node anterior to SVC with metabolic activity above blood  pool and below / equal to liver, max SUV 2.9, previously max  SUV 2.7. Additional subcentimeter prevascular, bilateral lower paratracheal lymph nodes are non FDG avid/below blood pool. Mild cardiomegaly.  Incidental CT findings: There is a new subpleural nodule in right lung base posteromedial aspect measuring 7 mm without FDG uptake (202/112). Right lower lobe pulmonary nodule measuring 6 mm is stable to prior and non FDG avid (2/97). No suspicious findings to suggest  ABDOMEN/PELVIS: No suspicious lymphadenopathy or abnormal metabolic activity to suggest visceral involvement by lymphoma. Mild splenomegaly measuring 14.4 cm with homogeneous metabolic activity max SUV 3.5, stable to prior.  Incidental CT findings: Sigmoid diverticulosis without diverticulitis. Mild prostatomegaly without focal activity.  SKELETON: Diffuse uptake throughout the bone marrow of the appendicular bones, nonspecific likely due to GCSF/chemotherapy. No suspicious lytic or sclerotic osseous lesion.  Incidental CT findings: None.  IMPRESSION: Persistent subcentimeter mediastinal lymph node anterior to SVC with mild FDG uptake, stable to prior. No new lymphadenopathy. (Deauville score3).  A new non FDG avid nodule in right lung base. Stable appearance of pre-existing right lower lobe non FDG avid). Nodule. These findings may be unrelated to lymphoma (possibly due to infection/inflammation, focal mucostasis or periosteophyte fibrosis) (Deauville score X).  Splenomegaly with diffuse FDG uptake, stable to prior.  Electronically Signed   By: Megan  Zare M.D.   On: 06/23/2024 18:13

## 2024-07-16 ENCOUNTER — Inpatient Hospital Stay: Attending: Hematology

## 2024-07-16 ENCOUNTER — Other Ambulatory Visit: Payer: Self-pay

## 2024-07-16 ENCOUNTER — Inpatient Hospital Stay

## 2024-07-16 ENCOUNTER — Other Ambulatory Visit: Payer: Self-pay | Admitting: *Deleted

## 2024-07-16 ENCOUNTER — Inpatient Hospital Stay: Admitting: Oncology

## 2024-07-16 VITALS — BP 128/84 | HR 58 | Temp 97.6°F | Resp 18

## 2024-07-16 VITALS — BP 141/89 | HR 51 | Temp 96.6°F | Resp 18 | Wt 202.4 lb

## 2024-07-16 DIAGNOSIS — L299 Pruritus, unspecified: Secondary | ICD-10-CM | POA: Insufficient documentation

## 2024-07-16 DIAGNOSIS — D849 Immunodeficiency, unspecified: Secondary | ICD-10-CM | POA: Diagnosis not present

## 2024-07-16 DIAGNOSIS — D61818 Other pancytopenia: Secondary | ICD-10-CM

## 2024-07-16 DIAGNOSIS — Z720 Tobacco use: Secondary | ICD-10-CM | POA: Diagnosis not present

## 2024-07-16 DIAGNOSIS — C9140 Hairy cell leukemia not having achieved remission: Secondary | ICD-10-CM

## 2024-07-16 DIAGNOSIS — F17210 Nicotine dependence, cigarettes, uncomplicated: Secondary | ICD-10-CM | POA: Diagnosis not present

## 2024-07-16 LAB — CBC WITH DIFFERENTIAL/PLATELET
Abs Immature Granulocytes: 0 K/uL (ref 0.00–0.07)
Basophils Absolute: 0 K/uL (ref 0.0–0.1)
Basophils Relative: 0 %
Eosinophils Absolute: 0 K/uL (ref 0.0–0.5)
Eosinophils Relative: 9 %
HCT: 22.4 % — ABNORMAL LOW (ref 39.0–52.0)
Hemoglobin: 7.2 g/dL — ABNORMAL LOW (ref 13.0–17.0)
Immature Granulocytes: 0 %
Lymphocytes Relative: 23 %
Lymphs Abs: 0.1 K/uL — ABNORMAL LOW (ref 0.7–4.0)
MCH: 33.6 pg (ref 26.0–34.0)
MCHC: 32.1 g/dL (ref 30.0–36.0)
MCV: 104.7 fL — ABNORMAL HIGH (ref 80.0–100.0)
Monocytes Absolute: 0 K/uL — ABNORMAL LOW (ref 0.1–1.0)
Monocytes Relative: 2 %
Neutro Abs: 0.3 K/uL — CL (ref 1.7–7.7)
Neutrophils Relative %: 66 %
Platelets: 111 K/uL — ABNORMAL LOW (ref 150–400)
RBC: 2.14 MIL/uL — ABNORMAL LOW (ref 4.22–5.81)
RDW: 15.7 % — ABNORMAL HIGH (ref 11.5–15.5)
WBC: 0.4 K/uL — CL (ref 4.0–10.5)
nRBC: 0 % (ref 0.0–0.2)

## 2024-07-16 LAB — COMPREHENSIVE METABOLIC PANEL WITH GFR
ALT: 10 U/L (ref 0–44)
AST: 13 U/L — ABNORMAL LOW (ref 15–41)
Albumin: 3.3 g/dL — ABNORMAL LOW (ref 3.5–5.0)
Alkaline Phosphatase: 32 U/L — ABNORMAL LOW (ref 38–126)
Anion gap: 7 (ref 5–15)
BUN: 23 mg/dL (ref 8–23)
CO2: 26 mmol/L (ref 22–32)
Calcium: 8.4 mg/dL — ABNORMAL LOW (ref 8.9–10.3)
Chloride: 106 mmol/L (ref 98–111)
Creatinine, Ser: 1.21 mg/dL (ref 0.61–1.24)
GFR, Estimated: >60 mL/min (ref >60)
Glucose, Bld: 112 mg/dL — ABNORMAL HIGH (ref 70–99)
Potassium: 4 mmol/L (ref 3.5–5.1)
Sodium: 139 mmol/L (ref 135–145)
Total Bilirubin: 0.6 mg/dL (ref 0.0–1.2)
Total Protein: 6.6 g/dL (ref 6.5–8.1)

## 2024-07-16 LAB — LACTATE DEHYDROGENASE: LDH: 108 U/L (ref 98–192)

## 2024-07-16 LAB — PREPARE RBC (CROSSMATCH)

## 2024-07-16 LAB — MAGNESIUM: Magnesium: 1.9 mg/dL (ref 1.7–2.4)

## 2024-07-16 LAB — URIC ACID: Uric Acid, Serum: 6.3 mg/dL (ref 3.7–8.6)

## 2024-07-16 MED ORDER — SULFAMETHOXAZOLE-TRIMETHOPRIM 800-160 MG PO TABS: 1.0000 | ORAL_TABLET | Freq: Two times a day (BID) | ORAL | 0 refills | Status: AC

## 2024-07-16 MED ORDER — DIPHENHYDRAMINE HCL 25 MG PO CAPS
25.0000 mg | ORAL_CAPSULE | Freq: Once | ORAL | Status: AC
  Administered 2024-07-16: 25 mg via ORAL
  Filled 2024-07-16: qty 1

## 2024-07-16 MED ORDER — ACYCLOVIR 400 MG PO TABS: 400.0000 mg | ORAL_TABLET | Freq: Two times a day (BID) | ORAL | 3 refills | Status: AC

## 2024-07-16 MED ORDER — ACETAMINOPHEN 325 MG PO TABS: 650.0000 mg | ORAL_TABLET | Freq: Once | ORAL | Status: DC

## 2024-07-16 MED ORDER — SODIUM CHLORIDE 0.9% IV SOLUTION
250.0000 mL | INTRAVENOUS | Status: AC
  Administered 2024-07-16: 100 mL via INTRAVENOUS

## 2024-07-16 MED ORDER — DIPHENHYDRAMINE HCL 25 MG PO CAPS: 25.0000 mg | ORAL_CAPSULE | Freq: Once | ORAL | Status: DC

## 2024-07-16 NOTE — Patient Instructions (Signed)
 [facility] [place] - A DEPT OF [facility]  Discharge Instructions: Thank you for choosing [facility] to provide your oncology and hematology care.  If you have a lab appointment with the Cancer Center - please note that after April 8th, 2024, all labs will be drawn in the cancer center.  You do not have to check in or register with the main entrance as you have in the past but will complete your check-in in the cancer center.  Wear comfortable clothing and clothing appropriate for easy access to any Portacath or PICC line.   We strive to give you quality time with your provider. You may need to reschedule your appointment if you arrive late (15 or more minutes).  Arriving late affects you and other patients whose appointments are after yours.  Also, if you miss three or more appointments without notifying the office, you may be dismissed from the clinic at the provider's discretion.      For prescription refill requests, have your pharmacy contact our office and allow 72 hours for refills to be completed.    Today you received the following PRBC (blood transfusion).  Blood Transfusion, Adult, Care After The following information offers guidance on how to care for yourself after your procedure. Your health care provider may also give you more specific instructions. If you have problems or questions, contact your health care provider. What can I expect after the procedure? After the procedure, it is common to have: Bruising and soreness where the IV was inserted. A headache. Follow these instructions at home: IV insertion site care     Follow instructions from your health care provider about how to take care of your IV insertion site. Make sure you: Wash your hands with soap and water for at least 20 seconds before and after you change your bandage (dressing). If soap and water are not available, use hand sanitizer. Change your dressing as told by your  health care provider. Check your IV insertion site every day for signs of infection. Check for: Redness, swelling, or pain. Bleeding from the site. Warmth. Pus or a bad smell. General instructions Take over-the-counter and prescription medicines only as told by your health care provider. Rest as told by your health care provider. Return to your normal activities as told by your health care provider. Keep all follow-up visits. Lab tests may need to be done at certain periods to recheck your blood counts. Contact a health care provider if: You have itching or red, swollen areas of skin (hives). You have a fever or chills. You have pain in the head, back, or chest. You feel anxious or you feel weak after doing your normal activities. You have redness, swelling, warmth, or pain around the IV insertion site. You have blood coming from the IV insertion site that does not stop with pressure. You have pus or a bad smell coming from your IV insertion site. If you received your blood transfusion in an outpatient setting, you will be told whom to contact to report any reactions. Get help right away if: You have symptoms of a serious allergic or immune system reaction, including: Trouble breathing or shortness of breath. Swelling of the face, feeling flushed, or widespread rash. Dark urine or blood in the urine. Fast heartbeat. These symptoms may be an emergency. Get help right away. Call 911. Do not wait to see if the symptoms will go away. Do not drive yourself to the  hospital. Summary Bruising and soreness around the IV insertion site are common. Check your IV insertion site every day for signs of infection. Rest as told by your health care provider. Return to your normal activities as told by your health care provider. Get help right away for symptoms of a serious allergic or immune system reaction to the blood transfusion. This information is not intended to replace advice given to you by  your health care provider. Make sure you discuss any questions you have with your health care provider. Document Revised: 01/26/2022 Document Reviewed: 01/26/2022 Elsevier Patient Education  2024 Elsevier Inc.   To help prevent nausea and vomiting after your treatment, we encourage you to take your nausea medication as directed.  BELOW ARE SYMPTOMS THAT SHOULD BE REPORTED IMMEDIATELY: *FEVER GREATER THAN 100.4 F (38 C) OR HIGHER *CHILLS OR SWEATING *NAUSEA AND VOMITING THAT IS NOT CONTROLLED WITH YOUR NAUSEA MEDICATION *UNUSUAL SHORTNESS OF BREATH *UNUSUAL BRUISING OR BLEEDING *URINARY PROBLEMS (pain or burning when urinating, or frequent urination) *BOWEL PROBLEMS (unusual diarrhea, constipation, pain near the anus) TENDERNESS IN MOUTH AND THROAT WITH OR WITHOUT PRESENCE OF ULCERS (sore throat, sores in mouth, or a toothache) UNUSUAL RASH, SWELLING OR PAIN  UNUSUAL VAGINAL DISCHARGE OR ITCHING   Items with * indicate a potential emergency and should be followed up as soon as possible or go to the Emergency Department if any problems should occur.  Please show the CHEMOTHERAPY ALERT CARD or IMMUNOTHERAPY ALERT CARD at check-in to the Emergency Department and triage nurse.  Should you have questions after your visit or need to cancel or reschedule your appointment, please contact [facility] [place] - A DEPT OF [name] [phone]  and follow the prompts.  Office hours are 8:00 a.m. to 4:30 p.m. Monday - Friday. Please note that voicemails left after 4:00 p.m. may not be returned until the following business day.  We are closed weekends and major holidays. You have access to a nurse at all times for urgent questions. Please call the main number to the clinic [phone] and follow the prompts.  For any non-urgent questions, you may also contact your provider using MyChart. We now offer e-Visits for anyone 47 and older to request care online for non-urgent  symptoms. For details visit [email or web address].   Also download the MyChart app! Go to the app store, search MyChart, open the app, select [place], and log in with your MyChart username and password.

## 2024-07-16 NOTE — Patient Instructions (Addendum)
 [facility] at [facility] Discharge Instructions   You were seen and examined today by Dr. Davonna.  She reviewed the results of your lab work which are mostly normal/stable. Your white blood cell count is very low and your hemoglobin is 7.2. We will arrange for you to have 2 units of blood today. We will also send in medications to help prevent you from getting an infection and also to prevent shingles. The antibiotic is Bactrim  DS. You will take this on Monday, Wednesday, and Friday for a total of 3 months. The anti-shingles medication is called acyclovir . You will take this medication twice a day every day.   We will repeat blood work in 2 weeks. We will see you back in 4 weeks. We will repeat lab work at that time.    Return as scheduled.    Thank you for choosing [facility] at [facility] to provide your oncology and hematology care.  To afford each patient quality time with our provider, please arrive at least 15 minutes before your scheduled appointment time.   If you have a lab appointment with the Cancer Center please come in thru the Main Entrance and check in at the main information desk.  You need to re-schedule your appointment should you arrive 10 or more minutes late.  We strive to give you quality time with our providers, and arriving late affects you and other patients whose appointments are after yours.  Also, if you no show three or more times for appointments you may be dismissed from the clinic at the providers discretion.     Again, thank you for choosing [facility].  Our hope is that these requests will decrease the amount of time that you wait before being seen by our physicians.       _____________________________________________________________  Should you have questions after your visit to [facility], please contact our office at [phone] and follow the prompts.  Our office hours are 8:00  a.m. and 4:30 p.m. Monday - Friday.  Please note that voicemails left after 4:00 p.m. may not be returned until the following business day.  We are closed weekends and major holidays.  You do have access to a nurse 24-7, just call the main number to the clinic [phone] and do not press any options, hold on the line and a nurse will answer the phone.    For prescription refill requests, have your pharmacy contact our office and allow 72 hours.    Due to Covid, you will need to wear a mask upon entering the hospital. If you do not have a mask, a mask will be given to you at the Main Entrance upon arrival. For doctor visits, patients may have 1 support person age 71 or older with them. For treatment visits, patients can not have anyone with them due to social distancing guidelines and our immunocompromised population.

## 2024-07-16 NOTE — Assessment & Plan Note (Addendum)
 Secondary to immunosuppression from cladribine   - Patient has hemoglobin of 7.2 and is symptomatic with severe fatigue.  Will transfuse 2 units of blood today - Will continue monitoring closely - Will assess for response at 4 months from cladribine  infusion  Will recheck labs in 2 weeks and transfuse as needed.

## 2024-07-16 NOTE — Progress Notes (Signed)
 Patient being seen by Dr Davonna fduring office visit, labs done and Hgb noted to be 7.2. Patient to receive 2 units of PRBC today per order. Patient allergic to tylenol , patient has had transfusion in past without complication. Patient to get Benadryl  only as premed.  1034-First unit of PRBC started.   Patient tolerated first unit PRBC well.   1234-Second unit of PRBC started.  Patient tolerated transfusion well with no complaints voiced.  Patient left ambulatory in stable condition.  Vital signs stable at discharge.  Follow up as scheduled.

## 2024-07-16 NOTE — Progress Notes (Signed)
 CRITICAL VALUE ALERT Critical value received:  WBC= 0.4 and ANC= 0.3 Date of notification:  07/16/2024 Time of notification: 0835 Critical value read back:  Yes.   Nurse who received alert:  Dena Daring, RN MD notified time and response:  Dr. Davonna notified at [phone]. MD is seeing patient today.

## 2024-07-16 NOTE — Assessment & Plan Note (Addendum)
 Patient is a chronic smoker.  Smokes at least a pack per day Has not smoked for almost a month now  - Continue using nicotine  patch

## 2024-07-17 ENCOUNTER — Other Ambulatory Visit: Payer: Self-pay

## 2024-07-17 LAB — TYPE AND SCREEN
ABO/RH(D): A POS
Antibody Screen: NEGATIVE
Unit division: 0
Unit division: 0

## 2024-07-17 LAB — T-HELPER CELLS CD4/CD8 %
% CD 4 Pos. Lymph.: 21.5 % — ABNORMAL LOW (ref 30.8–58.5)
Absolute CD 4 Helper: 22 /uL — ABNORMAL LOW (ref 359–1519)
Basophils Absolute: 0 x10E3/uL (ref 0.0–0.2)
Basos: 0 %
CD3+CD4+ Cells/CD3+CD8+ Cells Bld: 0.3 — ABNORMAL LOW (ref 0.92–3.72)
CD3+CD8+ Cells # Bld: 71 /uL — ABNORMAL LOW (ref 109–897)
CD3+CD8+ Cells NFr Bld: 70.7 % — ABNORMAL HIGH (ref 12.0–35.5)
EOS (ABSOLUTE): 0.1 x10E3/uL (ref 0.0–0.4)
Eos: 11 %
Hematocrit: 22.3 % — ABNORMAL LOW (ref 37.5–51.0)
Hemoglobin: 7.2 g/dL — ABNORMAL LOW (ref 13.0–17.7)
Immature Grans (Abs): 0 x10E3/uL (ref 0.0–0.1)
Immature Granulocytes: 0 %
Lymphocytes Absolute: 0.1 x10E3/uL — ABNORMAL LOW (ref 0.7–3.1)
Lymphs: 28 %
MCH: 34 pg — ABNORMAL HIGH (ref 26.6–33.0)
MCHC: 32.3 g/dL (ref 31.5–35.7)
MCV: 105 fL — ABNORMAL HIGH (ref 79–97)
Monocytes Absolute: 0 x10E3/uL — ABNORMAL LOW (ref 0.1–0.9)
Monocytes: 2 %
Neutrophils Absolute: 0.3 x10E3/uL — CL (ref 1.4–7.0)
Neutrophils: 59 %
Platelets: 115 x10E3/uL — ABNORMAL LOW (ref 150–450)
RBC: 2.12 x10E6/uL — ABNORMAL LOW (ref 4.14–5.80)
RDW: 16.4 % — ABNORMAL HIGH (ref 11.6–15.4)
WBC: 0.5 x10E3/uL — CL (ref 3.4–10.8)

## 2024-07-17 LAB — CYTOMEGALOVIRUS DNA, QUANTITATIVE REAL-TIME PCR, PLASMA
CMV DNA Quant: NEGATIVE [IU]/mL
Log10 CMV Qn DNA Pl: UNDETERMINED {Log_IU}/mL

## 2024-07-17 LAB — BPAM RBC
Blood Product Expiration Date: 202509212359
Blood Product Expiration Date: 202509262359
ISSUE DATE / TIME: 202509041016
ISSUE DATE / TIME: 202509041215
Unit Type and Rh: 6200
Unit Type and Rh: 6200

## 2024-07-28 ENCOUNTER — Telehealth: Payer: Self-pay | Admitting: *Deleted

## 2024-07-28 NOTE — Telephone Encounter (Signed)
 Patient called to advise that he has had rash and itching on neck, groin and arms x 3 days.  Denies fever. Has been taking benadryl  without effect.  Per Dr. Davonna, advised patient to use benadryl  cream and an appointment made for symptom management on 9/18.  Patient aware and verbalized understanding.

## 2024-07-30 ENCOUNTER — Inpatient Hospital Stay: Admitting: Oncology

## 2024-07-30 ENCOUNTER — Inpatient Hospital Stay

## 2024-07-30 VITALS — BP 139/91 | HR 53 | Temp 97.8°F | Resp 18 | Wt 201.9 lb

## 2024-07-30 DIAGNOSIS — D61818 Other pancytopenia: Secondary | ICD-10-CM

## 2024-07-30 DIAGNOSIS — L299 Pruritus, unspecified: Secondary | ICD-10-CM | POA: Diagnosis not present

## 2024-07-30 DIAGNOSIS — C9140 Hairy cell leukemia not having achieved remission: Secondary | ICD-10-CM

## 2024-07-30 DIAGNOSIS — Z720 Tobacco use: Secondary | ICD-10-CM

## 2024-07-30 DIAGNOSIS — F17210 Nicotine dependence, cigarettes, uncomplicated: Secondary | ICD-10-CM | POA: Diagnosis not present

## 2024-07-30 LAB — CBC WITH DIFFERENTIAL/PLATELET
Basophils Absolute: 0 K/uL (ref 0.0–0.1)
Basophils Relative: 0 %
Eosinophils Absolute: 0.5 K/uL (ref 0.0–0.5)
Eosinophils Relative: 36 %
HCT: 24.2 % — ABNORMAL LOW (ref 39.0–52.0)
Hemoglobin: 8 g/dL — ABNORMAL LOW (ref 13.0–17.0)
Lymphocytes Relative: 8 %
Lymphs Abs: 0.1 K/uL — ABNORMAL LOW (ref 0.7–4.0)
MCH: 33.8 pg (ref 26.0–34.0)
MCHC: 33.1 g/dL (ref 30.0–36.0)
MCV: 102.1 fL — ABNORMAL HIGH (ref 80.0–100.0)
Monocytes Absolute: 0 K/uL — ABNORMAL LOW (ref 0.1–1.0)
Monocytes Relative: 1 %
Neutro Abs: 0.8 K/uL — ABNORMAL LOW (ref 1.7–7.7)
Neutrophils Relative %: 55 %
Platelets: 93 K/uL — ABNORMAL LOW (ref 150–400)
RBC: 2.37 MIL/uL — ABNORMAL LOW (ref 4.22–5.81)
RDW: 16.6 % — ABNORMAL HIGH (ref 11.5–15.5)
WBC: 1.4 K/uL — CL (ref 4.0–10.5)
nRBC: 0 % (ref 0.0–0.2)

## 2024-07-30 LAB — SAMPLE TO BLOOD BANK

## 2024-07-30 NOTE — Assessment & Plan Note (Signed)
 Reports allover body itching secondary to dry skin. States has been taking Benadryl  at bedtime and soaking himself in warm baths with oil to help.  He has also been using different lotions to see if this helps. No visible rash. Continue Benadryl  at bedtime.  Can try Ponds dry skin cream to help with itching.  Please let us  know if you develop a rash.

## 2024-07-30 NOTE — Assessment & Plan Note (Addendum)
 Patient is a chronic smoker.  Smokes at least a pack per day Has not smoked for almost a month now  - Continue using nicotine  patch

## 2024-07-30 NOTE — Assessment & Plan Note (Addendum)
 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  (Will complete on 10/13/2024). - He will repeat bone marrow 4 months after completion of treatment. -Complete hematological response hemoglobin greater than 11, platelet counts greater than 100, ANC greater than 1500 and absence of morphologic evidence of LGL on both peripheral smear and bone marrow examination. - Return to clinic as scheduled in 2 weeks.  Labs have improved from previous.

## 2024-07-30 NOTE — Progress Notes (Signed)
 No blood needed today per MD

## 2024-07-30 NOTE — Assessment & Plan Note (Addendum)
 Secondary to immunosuppression from cladribine   - He received 2 units of blood at his last visit for a hemoglobin of 7.2. - Will assess for response at 4 months from cladribine  infusion - Repeat labs from 07/30/2024 show improvement of his hemoglobin no additional blood products needed. - Return to clinic as scheduled in 2 weeks.

## 2024-07-30 NOTE — Progress Notes (Signed)
 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 - Will assess for response at 4 months from cladribine  infusion - Repeat labs from 07/30/2024 show improvement of his hemoglobin no additional blood products needed. - Return to clinic as scheduled in 2 weeks.  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  (Will complete on 10/13/2024). - He will repeat bone marrow 4 months after completion of treatment. -Complete hematological response hemoglobin greater than 11, platelet counts greater than 100, ANC greater than 1500 and absence of morphologic evidence of LGL on both peripheral smear and bone marrow examination. - Return to clinic as scheduled in 2 weeks.  Labs have improved from previous. Tobacco use Patient is a chronic smoker.  Smokes at least a pack per day Has not smoked for almost a month now  - Continue using nicotine  patch Itching Reports allover body itching secondary to dry skin. States has been taking Benadryl  at bedtime and soaking himself in warm baths with oil to help.  He has also been using different lotions to see if this helps. No visible rash. Continue Benadryl  at bedtime.  Can try Ponds dry skin cream to help with itching.  Please let us  know if you develop a rash.  No orders of the defined types were placed in this encounter.   INTERVAL HISTORY: Patient returns for follow-up for hairy cell leukemia.   Completed 5 days of cladribine  days 1 through 5 from 06/29/2024 through 07/03/2024.  He was evaluated 2 weeks ago and found to have a hemoglobin of 7.2.  He was given 2 units of PRBCs.  Reports improvement of dizziness and fatigue since receiving blood.  Overall, feels well.  States over the past 5 to 10 days he has developed all over body itching.  States he has been using various lotions, head and shoulder shampoo and oils.  He has been soaking in warm baths and taking Benadryl  at bedtime.  No obvious rash.  We reviewed CBC.  SUMMARY OF HEMATOLOGIC HISTORY: Oncology History  Hairy cell leukemia (HCC)  06/24/2024 Initial Diagnosis   Hairy cell leukemia (HCC)   06/29/2024 -  Chemotherapy   Patient is on Treatment Plan : LEUKEMIA HAIRY CELL Cladribine  qd x 5 days        CBC    Component Value Date/Time   WBC 1.4 (LL) 07/30/2024 0916   RBC 2.37 (L) 07/30/2024 0916   HGB 8.0 (L) 07/30/2024 0916   HGB 7.2 (L) 07/16/2024 0738   HCT 24.2 (L) 07/30/2024 0916   HCT 22.3 (L) 07/16/2024 0738   PLT 93 (L) 07/30/2024 0916   PLT 115 (L) 07/16/2024 0738   MCV 102.1 (H) 07/30/2024 0916   MCV 105 (H) 07/16/2024 0738   MCH 33.8 07/30/2024 0916   MCHC 33.1 07/30/2024 0916   RDW 16.6 (H) 07/30/2024 0916   RDW 16.4 (H) 07/16/2024 0738   LYMPHSABS 0.1 (L) 07/30/2024 0916   LYMPHSABS 0.1 (L) 07/16/2024 0738   MONOABS  0.0 (L) 07/30/2024 0916   EOSABS 0.5 07/30/2024 0916   EOSABS 0.1 07/16/2024 0738   BASOSABS 0.0 07/30/2024 0916   BASOSABS 0.0 07/16/2024 0738       Latest Ref Rng & Units 07/16/2024    7:38 AM 07/08/2024   10:00 AM 07/03/2024    7:38 AM  CMP  Glucose 70 - 99 mg/dL 887  892  858   BUN 8 - 23 mg/dL 23  26  19    Creatinine 0.61 - 1.24 mg/dL 8.78  8.69  8.69   Sodium 135 - 145 mmol/L 139  137  136   Potassium 3.5 - 5.1 mmol/L 4.0  4.4  3.8   Chloride 98 - 111 mmol/L 106  103  104   CO2 22 - 32 mmol/L 26  23  24    Calcium 8.9 - 10.3 mg/dL 8.4  8.6  8.5   Total Protein 6.5 - 8.1  g/dL 6.6  7.5  7.2   Total Bilirubin 0.0 - 1.2 mg/dL 0.6  1.0  0.6   Alkaline Phos 38 - 126 U/L 32  35  36   AST 15 - 41 U/L 13  11  15    ALT 0 - 44 U/L 10  10  8       Lab Results  Component Value Date   FERRITIN 78 04/09/2024   VITAMINB12 237 04/09/2024    Vitals:   07/30/24 0954 07/30/24 0959  BP: (!) 141/101 (!) 139/91  Pulse: (!) 53   Resp: 18   Temp: 97.8 F (36.6 C)   SpO2: 100%     Review of System:  Review of Systems  Constitutional:  Positive for malaise/fatigue.  Skin:  Positive for itching.  Neurological:  Negative for dizziness and headaches.    Physical Exam: Physical Exam Constitutional:      Appearance: Normal appearance.  HENT:     Head: Normocephalic and atraumatic.  Eyes:     Pupils: Pupils are equal, round, and reactive to light.  Cardiovascular:     Rate and Rhythm: Normal rate and regular rhythm.     Heart sounds: Normal heart sounds. No murmur heard. Pulmonary:     Effort: Pulmonary effort is normal.     Breath sounds: Normal breath sounds. No wheezing.  Abdominal:     General: Bowel sounds are normal. There is no distension.     Palpations: Abdomen is soft.     Tenderness: There is no abdominal tenderness.  Musculoskeletal:        General: Normal range of motion.     Cervical back: Normal range of motion.  Skin:    General: Skin is warm and dry.     Findings: No rash.  Neurological:     Mental Status: He is alert and oriented to person, place, and time.     Gait: Gait is intact.  Psychiatric:        Mood and Affect: Mood and affect normal.        Cognition and Memory: Memory normal.        Judgment: Judgment normal.      I spent 25 minutes dedicated to the care of this patient (face-to-face and non-face-to-face) on the date of the encounter to include what is described in the assessment and plan.,  Delon Hope, NP 07/30/2024 12:05 PM

## 2024-07-30 NOTE — Progress Notes (Signed)
 CRITICAL VALUE ALERT Critical value received:  WBC 1.4 Date of notification:  07-30-24 Time of notification: 1035 Critical value read back:  Yes.   Nurse who received alert:  C. Primo Innis RN MD notified time and response:  1052 Delon Hope NP. No new orders per provider

## 2024-08-13 ENCOUNTER — Inpatient Hospital Stay

## 2024-08-13 ENCOUNTER — Inpatient Hospital Stay: Attending: Hematology | Admitting: Oncology

## 2024-08-13 VITALS — BP 118/80 | HR 62 | Temp 97.3°F | Resp 18

## 2024-08-13 VITALS — BP 129/77 | HR 82 | Temp 98.0°F | Resp 20 | Ht 74.0 in | Wt 195.0 lb

## 2024-08-13 DIAGNOSIS — C9140 Hairy cell leukemia not having achieved remission: Secondary | ICD-10-CM | POA: Insufficient documentation

## 2024-08-13 DIAGNOSIS — Z87891 Personal history of nicotine dependence: Secondary | ICD-10-CM | POA: Insufficient documentation

## 2024-08-13 DIAGNOSIS — N179 Acute kidney failure, unspecified: Secondary | ICD-10-CM | POA: Insufficient documentation

## 2024-08-13 DIAGNOSIS — R7989 Other specified abnormal findings of blood chemistry: Secondary | ICD-10-CM | POA: Insufficient documentation

## 2024-08-13 DIAGNOSIS — R61 Generalized hyperhidrosis: Secondary | ICD-10-CM | POA: Diagnosis not present

## 2024-08-13 DIAGNOSIS — R0602 Shortness of breath: Secondary | ICD-10-CM | POA: Insufficient documentation

## 2024-08-13 DIAGNOSIS — D649 Anemia, unspecified: Secondary | ICD-10-CM | POA: Diagnosis not present

## 2024-08-13 DIAGNOSIS — L2989 Other pruritus: Secondary | ICD-10-CM | POA: Insufficient documentation

## 2024-08-13 DIAGNOSIS — D61818 Other pancytopenia: Secondary | ICD-10-CM

## 2024-08-13 DIAGNOSIS — Z720 Tobacco use: Secondary | ICD-10-CM

## 2024-08-13 LAB — COMPREHENSIVE METABOLIC PANEL WITH GFR
ALT: 24 U/L (ref 0–44)
AST: 19 U/L (ref 15–41)
Albumin: 4 g/dL (ref 3.5–5.0)
Alkaline Phosphatase: 54 U/L (ref 38–126)
Anion gap: 14 (ref 5–15)
BUN: 20 mg/dL (ref 8–23)
CO2: 21 mmol/L — ABNORMAL LOW (ref 22–32)
Calcium: 8.9 mg/dL (ref 8.9–10.3)
Chloride: 101 mmol/L (ref 98–111)
Creatinine, Ser: 1.47 mg/dL — ABNORMAL HIGH (ref 0.61–1.24)
GFR, Estimated: 53 mL/min — ABNORMAL LOW (ref >60)
Glucose, Bld: 121 mg/dL — ABNORMAL HIGH (ref 70–99)
Potassium: 4.6 mmol/L (ref 3.5–5.1)
Sodium: 135 mmol/L (ref 135–145)
Total Bilirubin: 0.5 mg/dL (ref 0.0–1.2)
Total Protein: 7.5 g/dL (ref 6.5–8.1)

## 2024-08-13 LAB — CBC WITH DIFFERENTIAL/PLATELET
Abs Immature Granulocytes: 0.02 K/uL (ref 0.00–0.07)
Basophils Absolute: 0 K/uL (ref 0.0–0.1)
Basophils Relative: 0 %
Eosinophils Absolute: 0.2 K/uL (ref 0.0–0.5)
Eosinophils Relative: 6 %
HCT: 23.5 % — ABNORMAL LOW (ref 39.0–52.0)
Hemoglobin: 7.5 g/dL — ABNORMAL LOW (ref 13.0–17.0)
Immature Granulocytes: 1 %
Lymphocytes Relative: 4 %
Lymphs Abs: 0.1 K/uL — ABNORMAL LOW (ref 0.7–4.0)
MCH: 32.8 pg (ref 26.0–34.0)
MCHC: 31.9 g/dL (ref 30.0–36.0)
MCV: 102.6 fL — ABNORMAL HIGH (ref 80.0–100.0)
Monocytes Absolute: 0.1 K/uL (ref 0.1–1.0)
Monocytes Relative: 3 %
Neutro Abs: 2.6 K/uL (ref 1.7–7.7)
Neutrophils Relative %: 86 %
Platelets: 219 K/uL (ref 150–400)
RBC: 2.29 MIL/uL — ABNORMAL LOW (ref 4.22–5.81)
RDW: 17 % — ABNORMAL HIGH (ref 11.5–15.5)
WBC: 3 K/uL — ABNORMAL LOW (ref 4.0–10.5)
nRBC: 1 % — ABNORMAL HIGH (ref 0.0–0.2)

## 2024-08-13 LAB — SAMPLE TO BLOOD BANK

## 2024-08-13 LAB — PREPARE RBC (CROSSMATCH)

## 2024-08-13 MED ORDER — DIPHENHYDRAMINE HCL 25 MG PO CAPS
25.0000 mg | ORAL_CAPSULE | Freq: Once | ORAL | Status: AC
  Administered 2024-08-13: 25 mg via ORAL
  Filled 2024-08-13: qty 1

## 2024-08-13 MED ORDER — METHYLPREDNISOLONE SODIUM SUCC 125 MG IJ SOLR
125.0000 mg | Freq: Once | INTRAMUSCULAR | Status: AC
  Administered 2024-08-13: 125 mg via INTRAVENOUS
  Filled 2024-08-13: qty 2

## 2024-08-13 MED ORDER — ACETAMINOPHEN 325 MG PO TABS: 650.0000 mg | ORAL_TABLET | Freq: Once | ORAL | Status: DC

## 2024-08-13 MED ORDER — SODIUM CHLORIDE 0.9 % IV SOLN: Freq: Once | INTRAVENOUS | Status: AC

## 2024-08-13 MED ORDER — SODIUM CHLORIDE 0.9% IV SOLUTION
250.0000 mL | INTRAVENOUS | Status: DC
  Administered 2024-08-13: 100 mL via INTRAVENOUS

## 2024-08-13 NOTE — Progress Notes (Signed)
 Patient to receive Solu-Medrol 125 mg IV x 1 with blood transfusion due to delayed itching.  T.O. Dr Ivery Molt, PharmD

## 2024-08-13 NOTE — Patient Instructions (Addendum)
 [facility] at [facility] Discharge Instructions   You were seen and examined today by Dr. Davonna.  She reviewed the results of your lab work which are mostly normal/stable. Your hemoglobin is 7.5. We will arrange for you to have a unit of blood to correct this. We will also give you IV fluids today.   We will continue to check lab work every 2 weeks. We will see you back in 4 weeks.   Return as scheduled.    Thank you for choosing [facility] at [facility] to provide your oncology and hematology care.  To afford each patient quality time with our provider, please arrive at least 15 minutes before your scheduled appointment time.   If you have a lab appointment with the Cancer Center please come in thru the Main Entrance and check in at the main information desk.  You need to re-schedule your appointment should you arrive 10 or more minutes late.  We strive to give you quality time with our providers, and arriving late affects you and other patients whose appointments are after yours.  Also, if you no show three or more times for appointments you may be dismissed from the clinic at the providers discretion.     Again, thank you for choosing [facility].  Our hope is that these requests will decrease the amount of time that you wait before being seen by our physicians.       _____________________________________________________________  Should you have questions after your visit to [facility], please contact our office at [phone] and follow the prompts.  Our office hours are 8:00 a.m. and 4:30 p.m. Monday - Friday.  Please note that voicemails left after 4:00 p.m. may not be returned until the following business day.  We are closed weekends and major holidays.  You do have access to a nurse 24-7, just call the main number to the clinic [phone] and do not press any options, hold on the line and a nurse will  answer the phone.    For prescription refill requests, have your pharmacy contact our office and allow 72 hours.    Due to Covid, you will need to wear a mask upon entering the hospital. If you do not have a mask, a mask will be given to you at the Main Entrance upon arrival. For doctor visits, patients may have 1 support person age 3 or older with them. For treatment visits, patients can not have anyone with them due to social distancing guidelines and our immunocompromised population.

## 2024-08-13 NOTE — Progress Notes (Signed)
 Hemoglobin 7.5 today. Will give one unit of blood today per Dr. Davonna

## 2024-08-13 NOTE — Progress Notes (Signed)
 Patient presents today for 1 unit of blood and follow up visit with Dr. Davonna. Message received from A.Anderson RN / Dr. Davonna to give 1 liter of normal saline over and hour and 125 mg of IV solumedrol for pre-medication for blood products.   1 unit of blood given today per MD orders. Tolerated infusion without adverse affects. Vital signs stable. No complaints at this time. Discharged from clinic ambulatory in stable condition. Alert and oriented x 3. F/U with [facility] as scheduled.

## 2024-08-13 NOTE — Patient Instructions (Signed)
 [facility] [place] - A DEPT OF [place]  Discharge Instructions: Thank you for choosing [facility] to provide your oncology and hematology care.  If you have a lab appointment with the Cancer Center - please note that after April 8th, 2024, all labs will be drawn in the cancer center.  You do not have to check in or register with the main entrance as you have in the past but will complete your check-in in the cancer center.  Wear comfortable clothing and clothing appropriate for easy access to any Portacath or PICC line.   We strive to give you quality time with your provider. You may need to reschedule your appointment if you arrive late (15 or more minutes).  Arriving late affects you and other patients whose appointments are after yours.  Also, if you miss three or more appointments without notifying the office, you may be dismissed from the clinic at the provider's discretion.      For prescription refill requests, have your pharmacy contact our office and allow 72 hours for refills to be completed.    Today you received the following chemotherapy and/or immunotherapy agents blood products. Blood Transfusion, Adult, Care After After a blood transfusion, it is common to have: Bruising and soreness at the IV site. A headache. Follow these instructions at home: Your doctor may give you more instructions. If you have problems, contact your doctor. Insertion site care     Follow instructions from your doctor about how to take care of your insertion site. This is where an IV tube was put into your vein. Make sure you: Wash your hands with soap and water for at least 20 seconds before and after you change your bandage. If you cannot use soap and water, use hand sanitizer. Change your bandage as told by your doctor. Check your insertion site every day for signs of infection. Check for: Redness, swelling, or pain. Bleeding from the site. Warmth. Pus  or a bad smell. General instructions Take over-the-counter and prescription medicines only as told by your doctor. Rest as told by your doctor. Go back to your normal activities as told by your doctor. Keep all follow-up visits. You may need to have tests at certain times to check your blood. Contact a doctor if: You have itching or red, swollen areas of skin (hives). You have a fever or chills. You have pain in the head, back, or chest. You feel worried or nervous (anxious). You feel weak after doing your normal activities. You have any of these problems at the insertion site: Redness, swelling, warmth, or pain. Bleeding that does not stop with pressure. Pus or a bad smell. If you received your blood transfusion in an outpatient setting, you will be told whom to contact to report any reactions. Get help right away if: You have signs of a serious reaction. This may be coming from an allergy or the body's defense system (immune system). Signs include: Trouble breathing or shortness of breath. Swelling of the face or feeling warm (flushed). A widespread rash. Dark pee (urine) or blood in the pee. Fast heartbeat. These symptoms may be an emergency. Get help right away. Call 911. Do not wait to see if the symptoms will go away. Do not drive yourself to the hospital. Summary Bruising and soreness at the IV site are common. Check your insertion site every day for signs of infection. Rest as told by your doctor. Go back to  your normal activities as told by your doctor. Get help right away if you have signs of a serious reaction. This information is not intended to replace advice given to you by your health care provider. Make sure you discuss any questions you have with your health care provider. Document Revised: 01/26/2022 Document Reviewed: 01/26/2022 Elsevier Patient Education  2024 [employer].      To help prevent nausea and vomiting after your treatment, we encourage you to take  your nausea medication as directed.  BELOW ARE SYMPTOMS THAT SHOULD BE REPORTED IMMEDIATELY: *FEVER GREATER THAN 100.4 F (38 C) OR HIGHER *CHILLS OR SWEATING *NAUSEA AND VOMITING THAT IS NOT CONTROLLED WITH YOUR NAUSEA MEDICATION *UNUSUAL SHORTNESS OF BREATH *UNUSUAL BRUISING OR BLEEDING *URINARY PROBLEMS (pain or burning when urinating, or frequent urination) *BOWEL PROBLEMS (unusual diarrhea, constipation, pain near the anus) TENDERNESS IN MOUTH AND THROAT WITH OR WITHOUT PRESENCE OF ULCERS (sore throat, sores in mouth, or a toothache) UNUSUAL RASH, SWELLING OR PAIN  UNUSUAL VAGINAL DISCHARGE OR ITCHING   Items with * indicate a potential emergency and should be followed up as soon as possible or go to the Emergency Department if any problems should occur.  Please show the CHEMOTHERAPY ALERT CARD or IMMUNOTHERAPY ALERT CARD at check-in to the Emergency Department and triage nurse.  Should you have questions after your visit or need to cancel or reschedule your appointment, please contact [facility] [place] - A DEPT OF [name] [phone]  and follow the prompts.  Office hours are 8:00 a.m. to 4:30 p.m. Monday - Friday. Please note that voicemails left after 4:00 p.m. may not be returned until the following business day.  We are closed weekends and major holidays. You have access to a nurse at all times for urgent questions. Please call the main number to the clinic [phone] and follow the prompts.  For any non-urgent questions, you may also contact your provider using MyChart. We now offer e-Visits for anyone 22 and older to request care online for non-urgent symptoms. For details visit [email or web address].   Also download the MyChart app! Go to the app store, search MyChart, open the app, select [facility], and log in with your MyChart username and password.

## 2024-08-13 NOTE — Progress Notes (Signed)
 Patient Care Team: Shona Norleen PEDLAR, MD as PCP - General (Internal Medicine)  Clinic Day:  08/13/2024  Referring physician: Shona Norleen PEDLAR, MD   CHIEF COMPLAINT:  CC: Hairy cell leukemia.     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   -Patient has positive T-cell rearrangement.  Even though it does not have treatment implications, studies have shown that it can carry poor prognosis. - Will assess for complete hematologic response and repeat bone marrow biopsy 4 months after completion of treatment.             - Complete response: Hemoglobin greater than 11, platelets greater than 100, ANC greater than 1500.  Absence of morphologic evidence of LGL on both peripheral blood smear and the bone marrow exam. -Will obtain labs every 2 weeks  Return to clinic for follow-up in 4 weeks   Anemia Hemoglobin at 7.5 g/dL.  Likely secondary to cladribine   - Administer one unit of blood transfusion today.  AKI Likely secondary to poor oral intake - Administer fluids today.  Shortness of breath Mild exertional shortness of breath likely due to anemia from pancytopenia. No acute pulmonary issues.  Transfusion-related pruritus Pruritus post-transfusion, previously severe. Benadryl  partly effective previously. - Administer Benadryl  and steroids before blood transfusion today. - Advise to take Benadryl  at  home if itching occurs. - Instruct to call the clinic if itching persists.  Night sweats Night sweats, possibly related to treatment or hairy cell leukemia  Smoking cessation No longer smoking, previously used nicotine  patches.   The patient understands the plans discussed today and is in agreement with them.  He knows to contact our office if he develops concerns prior to his next appointment.  30 minutes of total time was spent for this patient encounter, including preparation, face-to-face counseling with the patient and coordination of care, physical exam, and documentation of the encounter. > 50% of the time was spent on counseling as documented under my assessment and plan.    LILLETTE Verneta SAUNDERS Teague,acting as a Neurosurgeon for [name], MD.,have documented all relevant documentation on the behalf of [name], MD,as directed by  [name], MD while in the presence of [name], MD.  I, [name] MD, have reviewed the above documentation for accuracy and completeness, and I agree with the above.     [name], MD  [facility] [place] - A DEPT OF [name] [address] [place] [address] Dept: [phone] Dept Fax: [phone]   Orders Placed This Encounter  Procedures   CBC with Differential/Platelet    Standing Status:   Future    Expected Date:   08/27/2024    Expiration Date:   11/25/2024   Comprehensive metabolic panel with GFR    Standing Status:   Future    Expected Date:   08/27/2024    Expiration Date:   11/25/2024  Lactate dehydrogenase    Standing Status:   Future    Expected Date:   08/27/2024    Expiration Date:   11/25/2024   Uric acid    Standing Status:   Future    Expected Date:   08/27/2024    Expiration Date:   11/25/2024     ONCOLOGY HISTORY:   I have reviewed his chart and materials related to his cancer extensively and collaborated history with the  patient. Summary of oncologic history is as follows:   Hairy Cell Leukemia:  -02/102022: Was being seen for pancytopenia.  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  There does appear to be some associated fibrosis which raises the possibility of hairy cell leukemia.  Some neoplastic lymphocytes expresses cyclin D1.  BCL6, CCND1/IgH t(11;14), IgH/BCL2 t(14;18), MALT1 (18q21): Not detected. Normal male karyotype. -10/12/2021: PET/CT: Splenomegaly.  Mild diffuse uptake throughout the spleen is noted again with SUV max of 3 point 0.6.  This is compared with liver activity of 2.89.  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  Mild, nonspecific uptake is also identified throughout the bone marrow of the axial and proximal appendicular skeleton.  The SUV max ranges between 3.05 and 2.3 are on background liver activity.  Mild uptake, above background liver activity, is noted within the porta hepatis region with an SUV max of 3.71.  No adenopathy or mass identified within the neck chest, abdomen or pelvis -04/09/2024: Peripheral blood flow cytometry: 0.5 to 1% of lymphocytes kappa restricted B cells with hairy cell immunophenotype (positive for CD103/CD11c) -04/09/2024: Myeloid NGS: No clinically significant variants identified -05/29/2024: Bone marrow biopsy: Flow cytometry: 40% of lymphocytes are T cells with loss of CD7 and coexpressing CD8.  8% of the lymphocytes are B cells with hairy cell leukemia immunophenotype(CD11c and CD103 positive/kappa restricted)  -Pathology: Suboptimal marrow with involvement by a B-cell population(essentially entire available marrow) consistent with hairy cell leukemia.  This is a limited biopsy with extensive population of CD20 positive B cells which coexpress cyclin D1 but are negative for CD5 and CD10. -06/09/2024: T cell  receptor gene arrangement: Positive -06/09/2024: BRAF V600E: Positive/detected -06/18/2024:Persistent subcentimeter mediastinal lymph node anterior to SVC with mild FDG uptake, stable to prior. No new lymphadenopathy. (Deauville score3).Splenomegaly with diffuse FDG uptake, stable to prior. A new non FDG avid nodule in right lung base. -06/29/2024-07/03/2024: Completed 5 days of cladribine   Current Treatment:  Completed 5 days of cladribine   INTERVAL HISTORY:   Discussed the use of AI scribe software for clinical note transcription with the patient, who gave verbal consent to proceed.  History of Present Illness Logan Brady is a 63 year old male with recent leukemia presenting for follow-up.  He experienced severe itching that began after a blood transfusion three weeks ago, affecting the back of his head and ankles. The itching persisted until last Sunday. He used an anti-itch spray, and took Benadryl  without relief. The itching has since resolved.  He experiences shortness of breath primarily when walking up and down stairs or with some walking. No shortness of breath when sitting or performing minimal activities such as going to the bathroom.  He reports night sweats with an odor resembling medication and urine, but no burning sensation. He has experienced weight loss but notes an improvement in appetite recently.  He is no longer smoking and had been using a nicotine  patch, but switched to  a different product due to cost.   I have reviewed the past medical history, past surgical history, social history and family history with the patient and they are unchanged from previous note.  ALLERGIES:  is allergic to ibuprofen, acetaminophen , aspirin, naproxen, and penicillins.  MEDICATIONS:  Current Outpatient Medications  Medication Sig Dispense Refill   acyclovir  (ZOVIRAX ) 400 MG tablet Take 1 tablet (400 mg total) by mouth 2 (two) times daily. 60 tablet 3   albuterol (VENTOLIN HFA) 108  (90 Base) MCG/ACT inhaler Inhale 1-2 puffs into the lungs every 4 (four) hours as needed for wheezing or shortness of breath.     Cyanocobalamin  (B-12 COMPLIANCE INJECTION) 1000 MCG/ML KIT Inject as directed every 30 (thirty) days.     hydrocortisone cream 1 % Apply 1 application topically 4 (four) times daily as needed for itching.     lidocaine -prilocaine  (EMLA ) cream Apply to affected area once 30 g 3   neomycin-bacitracin-polymyxin (NEOSPORIN) ophthalmic ointment Place 1 application into both eyes 4 (four) times daily as needed (styes).     nicotine  (NICODERM CQ ) 14 mg/24hr patch Place 1 patch (14 mg total) onto the skin daily. 28 patch 1   ondansetron  (ZOFRAN ) 8 MG tablet Take 1 tablet (8 mg total) by mouth every 8 (eight) hours as needed for nausea or vomiting. 30 tablet 1   prochlorperazine  (COMPAZINE ) 10 MG tablet Take 1 tablet (10 mg total) by mouth every 6 (six) hours as needed for nausea or vomiting. 30 tablet 1   rizatriptan (MAXALT) 5 MG tablet Take 5 mg by mouth daily as needed for migraine.     sildenafil (VIAGRA) 100 MG tablet Take 100 mg by mouth daily as needed for erectile dysfunction.     sulfamethoxazole -trimethoprim  (BACTRIM  DS) 800-160 MG tablet Take 1 tablet by mouth 2 (two) times daily. Take one tablet by mouth twice a day on Mondays, Wednesdays, and Fridays 72 tablet 0   tiZANidine (ZANAFLEX) 4 MG tablet Take 4 mg by mouth every 6 (six) hours as needed for muscle spasms.  1   traMADol  (ULTRAM ) 50 MG tablet Take 50 mg by mouth every 6 (six) hours as needed for moderate pain.     No current facility-administered medications for this visit.   Facility-Administered Medications Ordered in Other Visits  Medication Dose Route Frequency Provider Last Rate Last Admin   0.9 %  sodium chloride  infusion (Manually program via Guardrails IV Fluids)  250 mL Intravenous Continuous Mekayla Soman, MD   Stopped at 07/16/24 1354   cyanocobalamin  ((VITAMIN B-12)) injection 1,000 mcg  1,000  mcg Intramuscular Once Katragadda, Sreedhar, MD       cyanocobalamin  ((VITAMIN B-12)) injection 1,000 mcg  1,000 mcg Intramuscular Once Katragadda, Sreedhar, MD        REVIEW OF SYSTEMS:   Constitutional: Denies fevers, chills or abnormal weight loss Eyes: Denies blurriness of vision Ears, nose, mouth, throat, and face: Denies mucositis or sore throat Respiratory: Denies cough, dyspnea or wheezes Cardiovascular: Denies palpitation, chest discomfort or lower extremity swelling Gastrointestinal:  Denies nausea, heartburn or change in bowel habits Skin: Denies abnormal skin rashes Lymphatics: Denies new lymphadenopathy or easy bruising Neurological:Denies numbness, tingling or new weaknesses Behavioral/Psych: Mood is stable, no new changes  All other systems were reviewed with the patient and are negative.   VITALS:  Blood pressure 129/77, pulse 82, temperature 98 F (36.7 C), temperature source Oral, resp. rate 20, height 6' 2 (1.88 m), weight 195 lb (88.5 kg), SpO2 97%.  Wt  Readings from Last 3 Encounters:  08/13/24 195 lb (88.5 kg)  07/30/24 201 lb 15.1 oz (91.6 kg)  07/16/24 202 lb 6.4 oz (91.8 kg)    Body mass index is 25.04 kg/m.  Performance status (ECOG): 0 - Asymptomatic  PHYSICAL EXAM:   GENERAL:alert, no distress and comfortable SKIN: skin color, texture, turgor are normal, no rashes or significant lesions NECK: supple, thyroid  normal size, non-tender, without nodularity LYMPH:  no palpable lymphadenopathy in the cervical, axillary or inguinal LUNGS: clear to auscultation and percussion with normal breathing effort HEART: regular rate & rhythm and no murmurs and no lower extremity edema ABDOMEN:abdomen soft, non-tender and normal bowel sounds Musculoskeletal:no cyanosis of digits and no clubbing  NEURO: alert & oriented x 3 with fluent speech, no focal motor/sensory deficits  LABORATORY DATA:  I have reviewed the data as listed    Component Value Date/Time    NA 135 08/13/2024 0851   K 4.6 08/13/2024 0851   CL 101 08/13/2024 0851   CO2 21 (L) 08/13/2024 0851   GLUCOSE 121 (H) 08/13/2024 0851   BUN 20 08/13/2024 0851   CREATININE 1.47 (H) 08/13/2024 0851   CALCIUM 8.9 08/13/2024 0851   PROT 7.5 08/13/2024 0851   ALBUMIN 4.0 08/13/2024 0851   AST 19 08/13/2024 0851   ALT 24 08/13/2024 0851   ALKPHOS 54 08/13/2024 0851   BILITOT 0.5 08/13/2024 0851   GFRNONAA 53 (L) 08/13/2024 0851   GFRAA >60 01/19/2019 2141    Lab Results  Component Value Date   WBC 3.0 (L) 08/13/2024   NEUTROABS 2.6 08/13/2024   HGB 7.5 (L) 08/13/2024   HCT 23.5 (L) 08/13/2024   MCV 102.6 (H) 08/13/2024   PLT 219 08/13/2024      Chemistry      Component Value Date/Time   NA 135 08/13/2024 0851   K 4.6 08/13/2024 0851   CL 101 08/13/2024 0851   CO2 21 (L) 08/13/2024 0851   BUN 20 08/13/2024 0851   CREATININE 1.47 (H) 08/13/2024 0851      Component Value Date/Time   CALCIUM 8.9 08/13/2024 0851   ALKPHOS 54 08/13/2024 0851   AST 19 08/13/2024 0851   ALT 24 08/13/2024 0851   BILITOT 0.5 08/13/2024 0851      RADIOGRAPHIC STUDIES: I have personally reviewed the radiological images as listed and agreed with the findings in the report.  NM PET Image Restage (PS) Skull Base to Thigh (F-18 FDG) CLINICAL DATA:  Subsequent treatment strategy for non-Hodgkin B-cell lymphoma, pancytopenia  EXAM: NUCLEAR MEDICINE PET SKULL BASE TO THIGH  TECHNIQUE: 10.55 mCi F-18 FDG was injected intravenously. Full-ring PET imaging was performed from the skull base to thigh after the radiotracer. CT data was obtained and used for attenuation correction and anatomic localization.  Fasting blood glucose: 120 mg/dl  COMPARISON:  PET-CT October 12, 2021  FINDINGS: Mediastinal blood pool activity: SUV max 2  Liver activity: SUV max 2.9  NECK: Unremarkable  Incidental CT findings: None.  CHEST: Prevascular subcentimeter lymph node anterior to SVC with metabolic  activity above blood pool and below / equal to liver, max SUV 2.9, previously max SUV 2.7. Additional subcentimeter prevascular, bilateral lower paratracheal lymph nodes are non FDG avid/below blood pool. Mild cardiomegaly.  Incidental CT findings: There is a new subpleural nodule in right lung base posteromedial aspect measuring 7 mm without FDG uptake (202/112). Right lower lobe pulmonary nodule measuring 6 mm is stable to prior and non FDG avid (2/97). No suspicious  findings to suggest  ABDOMEN/PELVIS: No suspicious lymphadenopathy or abnormal metabolic activity to suggest visceral involvement by lymphoma. Mild splenomegaly measuring 14.4 cm with homogeneous metabolic activity max SUV 3.5, stable to prior.  Incidental CT findings: Sigmoid diverticulosis without diverticulitis. Mild prostatomegaly without focal activity.  SKELETON: Diffuse uptake throughout the bone marrow of the appendicular bones, nonspecific likely due to GCSF/chemotherapy. No suspicious lytic or sclerotic osseous lesion.  Incidental CT findings: None.  IMPRESSION: Persistent subcentimeter mediastinal lymph node anterior to SVC with mild FDG uptake, stable to prior. No new lymphadenopathy. (Deauville score3).  A new non FDG avid nodule in right lung base. Stable appearance of pre-existing right lower lobe non FDG avid). Nodule. These findings may be unrelated to lymphoma (possibly due to infection/inflammation, focal mucostasis or periosteophyte fibrosis) (Deauville score X).  Splenomegaly with diffuse FDG uptake, stable to prior.  Electronically Signed   By: Megan  Zare M.D.   On: 06/23/2024 18:13

## 2024-08-14 ENCOUNTER — Other Ambulatory Visit: Payer: Self-pay

## 2024-08-14 LAB — TYPE AND SCREEN
ABO/RH(D): A POS
Antibody Screen: NEGATIVE
Unit division: 0

## 2024-08-14 LAB — BPAM RBC
Blood Product Expiration Date: 202510252359
ISSUE DATE / TIME: 202510021135
Unit Type and Rh: 6200

## 2024-08-26 ENCOUNTER — Other Ambulatory Visit: Payer: Self-pay

## 2024-08-26 DIAGNOSIS — D61818 Other pancytopenia: Secondary | ICD-10-CM

## 2024-08-27 ENCOUNTER — Inpatient Hospital Stay

## 2024-08-27 DIAGNOSIS — D61818 Other pancytopenia: Secondary | ICD-10-CM

## 2024-08-27 DIAGNOSIS — C9140 Hairy cell leukemia not having achieved remission: Secondary | ICD-10-CM

## 2024-08-27 LAB — CBC WITH DIFFERENTIAL/PLATELET
Abs Immature Granulocytes: 0.01 K/uL (ref 0.00–0.07)
Basophils Absolute: 0 K/uL (ref 0.0–0.1)
Basophils Relative: 1 %
Eosinophils Absolute: 0.2 K/uL (ref 0.0–0.5)
Eosinophils Relative: 5 %
HCT: 28.1 % — ABNORMAL LOW (ref 39.0–52.0)
Hemoglobin: 9 g/dL — ABNORMAL LOW (ref 13.0–17.0)
Immature Granulocytes: 0 %
Lymphocytes Relative: 5 %
Lymphs Abs: 0.2 K/uL — ABNORMAL LOW (ref 0.7–4.0)
MCH: 34.4 pg — ABNORMAL HIGH (ref 26.0–34.0)
MCHC: 32 g/dL (ref 30.0–36.0)
MCV: 107.3 fL — ABNORMAL HIGH (ref 80.0–100.0)
Monocytes Absolute: 0.2 K/uL (ref 0.1–1.0)
Monocytes Relative: 7 %
Neutro Abs: 2.6 K/uL (ref 1.7–7.7)
Neutrophils Relative %: 82 %
Platelets: 146 K/uL — ABNORMAL LOW (ref 150–400)
RBC: 2.62 MIL/uL — ABNORMAL LOW (ref 4.22–5.81)
RDW: 19.3 % — ABNORMAL HIGH (ref 11.5–15.5)
WBC: 3.2 K/uL — ABNORMAL LOW (ref 4.0–10.5)
nRBC: 0.6 % — ABNORMAL HIGH (ref 0.0–0.2)

## 2024-08-27 LAB — COMPREHENSIVE METABOLIC PANEL WITH GFR
ALT: 8 U/L (ref 0–44)
AST: 17 U/L (ref 15–41)
Albumin: 4.1 g/dL (ref 3.5–5.0)
Alkaline Phosphatase: 49 U/L (ref 38–126)
Anion gap: 8 (ref 5–15)
BUN: 20 mg/dL (ref 8–23)
CO2: 26 mmol/L (ref 22–32)
Calcium: 9.1 mg/dL (ref 8.9–10.3)
Chloride: 105 mmol/L (ref 98–111)
Creatinine, Ser: 1.5 mg/dL — ABNORMAL HIGH (ref 0.61–1.24)
GFR, Estimated: 52 mL/min — ABNORMAL LOW (ref >60)
Glucose, Bld: 94 mg/dL (ref 70–99)
Potassium: 4.9 mmol/L (ref 3.5–5.1)
Sodium: 139 mmol/L (ref 135–145)
Total Bilirubin: 0.3 mg/dL (ref 0.0–1.2)
Total Protein: 7.1 g/dL (ref 6.5–8.1)

## 2024-08-27 LAB — SAMPLE TO BLOOD BANK

## 2024-08-27 LAB — URIC ACID: Uric Acid, Serum: 6.9 mg/dL (ref 3.7–8.6)

## 2024-08-27 LAB — LACTATE DEHYDROGENASE: LDH: 181 U/L (ref 98–192)

## 2024-09-08 ENCOUNTER — Other Ambulatory Visit: Payer: Self-pay

## 2024-09-08 DIAGNOSIS — D61818 Other pancytopenia: Secondary | ICD-10-CM

## 2024-09-08 DIAGNOSIS — C9140 Hairy cell leukemia not having achieved remission: Secondary | ICD-10-CM

## 2024-09-09 ENCOUNTER — Inpatient Hospital Stay

## 2024-09-09 ENCOUNTER — Inpatient Hospital Stay: Admitting: Oncology

## 2024-09-09 VITALS — BP 149/84 | HR 56 | Temp 97.8°F | Resp 17 | Wt 201.0 lb

## 2024-09-09 DIAGNOSIS — C9140 Hairy cell leukemia not having achieved remission: Secondary | ICD-10-CM

## 2024-09-09 DIAGNOSIS — D61818 Other pancytopenia: Secondary | ICD-10-CM

## 2024-09-09 LAB — CBC WITH DIFFERENTIAL/PLATELET
Abs Immature Granulocytes: 0.01 K/uL (ref 0.00–0.07)
Basophils Absolute: 0 K/uL (ref 0.0–0.1)
Basophils Relative: 1 %
Eosinophils Absolute: 0.3 K/uL (ref 0.0–0.5)
Eosinophils Relative: 9 %
HCT: 32.5 % — ABNORMAL LOW (ref 39.0–52.0)
Hemoglobin: 10.2 g/dL — ABNORMAL LOW (ref 13.0–17.0)
Immature Granulocytes: 0 %
Lymphocytes Relative: 6 %
Lymphs Abs: 0.2 K/uL — ABNORMAL LOW (ref 0.7–4.0)
MCH: 33.3 pg (ref 26.0–34.0)
MCHC: 31.4 g/dL (ref 30.0–36.0)
MCV: 106.2 fL — ABNORMAL HIGH (ref 80.0–100.0)
Monocytes Absolute: 0.3 K/uL (ref 0.1–1.0)
Monocytes Relative: 10 %
Neutro Abs: 2.3 K/uL (ref 1.7–7.7)
Neutrophils Relative %: 74 %
Platelets: 145 K/uL — ABNORMAL LOW (ref 150–400)
RBC: 3.06 MIL/uL — ABNORMAL LOW (ref 4.22–5.81)
RDW: 18 % — ABNORMAL HIGH (ref 11.5–15.5)
WBC: 3.1 K/uL — ABNORMAL LOW (ref 4.0–10.5)
nRBC: 0 % (ref 0.0–0.2)

## 2024-09-09 LAB — COMPREHENSIVE METABOLIC PANEL WITH GFR
ALT: 6 U/L (ref 0–44)
AST: 17 U/L (ref 15–41)
Albumin: 4.3 g/dL (ref 3.5–5.0)
Alkaline Phosphatase: 45 U/L (ref 38–126)
Anion gap: 7 (ref 5–15)
BUN: 16 mg/dL (ref 8–23)
CO2: 28 mmol/L (ref 22–32)
Calcium: 8.8 mg/dL — ABNORMAL LOW (ref 8.9–10.3)
Chloride: 105 mmol/L (ref 98–111)
Creatinine, Ser: 1.19 mg/dL (ref 0.61–1.24)
GFR, Estimated: >60 mL/min (ref >60)
Glucose, Bld: 89 mg/dL (ref 70–99)
Potassium: 4.8 mmol/L (ref 3.5–5.1)
Sodium: 141 mmol/L (ref 135–145)
Total Bilirubin: 0.4 mg/dL (ref 0.0–1.2)
Total Protein: 7.2 g/dL (ref 6.5–8.1)

## 2024-09-09 LAB — SAMPLE TO BLOOD BANK

## 2024-09-09 NOTE — Progress Notes (Signed)
 Patient Care Team: Shona Norleen PEDLAR, MD as PCP - General (Internal Medicine)  Clinic Day:  09/09/2024  Referring physician: Shona Norleen PEDLAR, MD   CHIEF COMPLAINT:  CC: Hairy cell leukemia.     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   -Patient has positive T-cell rearrangement.  Even though it does not have treatment implications, studies have shown that it can carry poor prognosis. - Will assess for complete hematologic response and repeat bone marrow biopsy 4 months after completion of treatment.  Patient prefers this to be done in January             - Complete response: Hemoglobin greater than 11, platelets greater than 100, ANC greater than 1500.  Absence of morphologic evidence of LGL on both peripheral blood smear and the bone marrow exam. - Patient symptomatically feels significantly better.  Return to clinic for follow-up in 4 weeks   Anemia Hemoglobin at 10.2 g/dL.  Likely secondary to cladribine   Shortness of breath Likely secondary to anemia. Resolved at this time  Transfusion-related pruritus Pruritus post-transfusion, previously severe. Benadryl  partly effective previously. - Administer Benadryl  and steroids before future blood transfusions  Night sweats Night sweats, possibly related to treatment or hairy cell leukemia. Resolved at this time.  Smoking cessation No longer smoking,  previously used nicotine  patches.   The patient understands the plans discussed today and is in agreement with them.  He knows to contact our office if he develops concerns prior to his next appointment.  The total time spent in the appointment was 20 minutes for the encounter  with patient, including review of chart and various tests results, discussions about plan of care and coordination of care plan   I, Marijo Sharps, acting as a scribe for [name], MD.,have documented all relevant documentation on the behalf of [name], MD,as directed by  [name], MD while in the presence of [name], MD.  I, [name] MD, have reviewed the above documentation for accuracy and completeness, and I agree with the above.    [name], MD  [facility] [place] - A DEPT OF [name] [address] [place] [address] Dept: [phone] Dept Fax: [phone]   No orders of the defined types were placed in this encounter.    ONCOLOGY HISTORY:   I have reviewed his chart and materials related to his cancer extensively and collaborated history with the patient. Summary of oncologic history is as follows:   Hairy Cell Leukemia:  -02/102022: Was being seen for pancytopenia.  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  There does appear to be some associated fibrosis which raises the possibility of hairy cell leukemia.  Some neoplastic lymphocytes expresses cyclin D1.  BCL6, CCND1/IgH t(11;14), IgH/BCL2 t(14;18), MALT1 (18q21): Not detected. Normal  male karyotype. -10/12/2021: PET/CT: Splenomegaly.  Mild diffuse uptake throughout the spleen is noted again with SUV max of 3 point 0.6.  This is compared with liver activity of 2.89.  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  Mild, nonspecific uptake is also identified throughout the bone marrow of the axial and proximal appendicular skeleton.  The SUV max ranges between 3.05 and 2.3 are on background liver activity.  Mild uptake, above background liver activity, is noted within the porta hepatis region with an SUV max of 3.71.  No adenopathy or mass identified within the neck chest, abdomen or pelvis -04/09/2024: Peripheral blood flow cytometry: 0.5 to 1% of lymphocytes kappa restricted B cells with hairy cell immunophenotype (positive for CD103/CD11c) -04/09/2024: Myeloid NGS: No clinically significant variants identified -05/29/2024: Bone marrow biopsy: Flow cytometry: 40% of lymphocytes are T cells with loss of CD7 and coexpressing CD8.  8% of the lymphocytes are B cells with hairy cell leukemia immunophenotype(CD11c and CD103 positive/kappa restricted)  -Pathology: Suboptimal marrow with involvement by a B-cell population(essentially entire available marrow) consistent with hairy cell leukemia.  This is a limited biopsy with extensive population of CD20 positive B cells which coexpress cyclin D1 but are negative for CD5 and CD10. -06/09/2024: T cell receptor gene arrangement: Positive -06/09/2024: BRAF V600E: Positive/detected -06/18/2024:Persistent subcentimeter mediastinal lymph node anterior to SVC with mild FDG uptake, stable to prior. No new lymphadenopathy. (Deauville score3).Splenomegaly with diffuse FDG uptake, stable to prior. A new non FDG avid nodule in right lung base. -06/29/2024-07/03/2024: Completed 5 days of cladribine   Current Treatment:  Completed 5 days of cladribine   INTERVAL HISTORY:  Logan Brady is a 63 year old male with recent leukemia presenting for follow-up. He is unaccompanied today.   He denies night sweats, SOB, fever, or chills. He notes that he has had some intentional weight gain.  He reported that he is finally feeling  like normal again.  We reviewed  labs today.  Patient symptomatically feels significantly better with resolution of shortness of breath and night sweats.  Logan Brady states that he would like to do his next bone marrow biopsy after Christmas.  I have reviewed the past medical history, past surgical history, social history and family history with the patient and they are unchanged from previous note.  ALLERGIES:  is allergic to ibuprofen, acetaminophen , aspirin, naproxen, and penicillins.  MEDICATIONS:  Current Outpatient Medications  Medication Sig Dispense Refill   acyclovir  (ZOVIRAX ) 400 MG tablet Take 1 tablet (400 mg total) by mouth 2 (two) times daily. 60 tablet 3   albuterol (VENTOLIN HFA) 108 (90 Base) MCG/ACT inhaler Inhale 1-2 puffs into the lungs every 4 (four) hours as needed for wheezing or shortness of breath.     Cyanocobalamin  (B-12 COMPLIANCE INJECTION) 1000 MCG/ML KIT Inject as directed every 30 (thirty) days.     hydrocortisone cream 1 % Apply 1 application topically 4 (four) times daily as needed for itching.     lidocaine -prilocaine  (EMLA ) cream Apply to affected area once 30 g 3   neomycin-bacitracin-polymyxin (NEOSPORIN) ophthalmic ointment Place 1 application into both eyes 4 (four) times daily as needed (styes).     nicotine  (NICODERM CQ ) 14 mg/24hr patch Place 1 patch (14 mg total) onto the skin daily. 28 patch 1   ondansetron  (ZOFRAN ) 8 MG tablet Take 1 tablet (8 mg total) by mouth every 8 (eight) hours as needed for nausea or vomiting. 30 tablet 1   prochlorperazine  (COMPAZINE ) 10 MG tablet Take 1 tablet (10 mg  total) by mouth every 6 (six) hours as needed for nausea or vomiting. 30 tablet 1   rizatriptan (MAXALT) 5 MG tablet Take 5 mg by mouth daily as needed for migraine.     sildenafil (VIAGRA) 100 MG tablet Take 100 mg by mouth daily as needed for erectile dysfunction.     sulfamethoxazole -trimethoprim  (BACTRIM  DS) 800-160 MG tablet Take 1 tablet by mouth 2 (two) times daily. Take one tablet by mouth  twice a day on Mondays, Wednesdays, and Fridays 72 tablet 0   tiZANidine (ZANAFLEX) 4 MG tablet Take 4 mg by mouth every 6 (six) hours as needed for muscle spasms.  1   traMADol  (ULTRAM ) 50 MG tablet Take 50 mg by mouth every 6 (six) hours as needed for moderate pain.     No current facility-administered medications for this visit.   Facility-Administered Medications Ordered in Other Visits  Medication Dose Route Frequency Provider Last Rate Last Admin   0.9 %  sodium chloride  infusion (Manually program via Guardrails IV Fluids)  250 mL Intravenous Continuous Qiara Minetti, MD   Stopped at 07/16/24 1354   cyanocobalamin  ((VITAMIN B-12)) injection 1,000 mcg  1,000 mcg Intramuscular Once Katragadda, Sreedhar, MD       cyanocobalamin  ((VITAMIN B-12)) injection 1,000 mcg  1,000 mcg Intramuscular Once Katragadda, Sreedhar, MD        REVIEW OF SYSTEMS:   Constitutional: Denies fevers, chills or abnormal weight loss Eyes: Denies blurriness of vision Ears, nose, mouth, throat, and face: Denies mucositis or sore throat Respiratory: Denies cough, dyspnea or wheezes Cardiovascular: Denies palpitation, chest discomfort or lower extremity swelling Gastrointestinal:  Denies nausea, heartburn or change in bowel habits Skin: Denies abnormal skin rashes Lymphatics: Denies new lymphadenopathy or easy bruising Neurological:Denies numbness, tingling or new weaknesses Behavioral/Psych: Mood is stable, no new changes  All other systems were reviewed with the patient and are negative.   VITALS:  Blood pressure (!) 149/84, pulse (!) 56, temperature 97.8 F (36.6 C), temperature source Tympanic, resp. rate 17, weight 201 lb (91.2 kg), SpO2 100%.  Wt Readings from Last 3 Encounters:  09/09/24 201 lb (91.2 kg)  08/13/24 195 lb (88.5 kg)  07/30/24 201 lb 15.1 oz (91.6 kg)    Body mass index is 25.81 kg/m.  Performance status (ECOG): 0 - Asymptomatic  PHYSICAL EXAM:   GENERAL:alert, no distress and  comfortable SKIN: skin color, texture, turgor are normal, no rashes or significant lesions NECK: supple, thyroid  normal size, non-tender, without nodularity LYMPH:  no palpable lymphadenopathy in the cervical, axillary or inguinal LUNGS: clear to auscultation and percussion with normal breathing effort HEART: regular rate & rhythm and no murmurs and no lower extremity edema ABDOMEN:abdomen soft, non-tender and normal bowel sounds Musculoskeletal:no cyanosis of digits and no clubbing  NEURO: alert & oriented x 3 with fluent speech  LABORATORY DATA:  I have reviewed the data as listed    Component Value Date/Time   NA 141 09/09/2024 0734   K 4.8 09/09/2024 0734   CL 105 09/09/2024 0734   CO2 28 09/09/2024 0734   GLUCOSE 89 09/09/2024 0734   BUN 16 09/09/2024 0734   CREATININE 1.19 09/09/2024 0734   CALCIUM 8.8 (L) 09/09/2024 0734   PROT 7.2 09/09/2024 0734   ALBUMIN 4.3 09/09/2024 0734   AST 17 09/09/2024 0734   ALT 6 09/09/2024 0734   ALKPHOS 45 09/09/2024 0734   BILITOT 0.4 09/09/2024 0734   GFRNONAA >60 09/09/2024 0734   GFRAA >60 01/19/2019 2141  Lab Results  Component Value Date   WBC 3.1 (L) 09/09/2024   NEUTROABS 2.3 09/09/2024   HGB 10.2 (L) 09/09/2024   HCT 32.5 (L) 09/09/2024   MCV 106.2 (H) 09/09/2024   PLT 145 (L) 09/09/2024      Chemistry      Component Value Date/Time   NA 141 09/09/2024 0734   K 4.8 09/09/2024 0734   CL 105 09/09/2024 0734   CO2 28 09/09/2024 0734   BUN 16 09/09/2024 0734   CREATININE 1.19 09/09/2024 0734      Component Value Date/Time   CALCIUM 8.8 (L) 09/09/2024 0734   ALKPHOS 45 09/09/2024 0734   AST 17 09/09/2024 0734   ALT 6 09/09/2024 0734   BILITOT 0.4 09/09/2024 0734      RADIOGRAPHIC STUDIES: I have personally reviewed the radiological images as listed and agreed with the findings in the report.

## 2024-09-10 ENCOUNTER — Other Ambulatory Visit: Payer: Self-pay

## 2024-09-18 ENCOUNTER — Other Ambulatory Visit: Payer: Self-pay

## 2024-10-13 ENCOUNTER — Other Ambulatory Visit: Payer: Self-pay

## 2024-10-13 DIAGNOSIS — C9140 Hairy cell leukemia not having achieved remission: Secondary | ICD-10-CM

## 2024-10-14 ENCOUNTER — Inpatient Hospital Stay

## 2024-10-14 ENCOUNTER — Inpatient Hospital Stay: Attending: Hematology | Admitting: Oncology

## 2024-10-14 VITALS — BP 158/98 | HR 59 | Temp 98.1°F | Resp 18 | Ht 74.0 in | Wt 203.4 lb

## 2024-10-14 DIAGNOSIS — Z87891 Personal history of nicotine dependence: Secondary | ICD-10-CM | POA: Insufficient documentation

## 2024-10-14 DIAGNOSIS — C9140 Hairy cell leukemia not having achieved remission: Secondary | ICD-10-CM | POA: Insufficient documentation

## 2024-10-14 LAB — COMPREHENSIVE METABOLIC PANEL WITH GFR
ALT: 7 U/L (ref 0–44)
AST: 17 U/L (ref 15–41)
Albumin: 4.4 g/dL (ref 3.5–5.0)
Alkaline Phosphatase: 54 U/L (ref 38–126)
Anion gap: 14 (ref 5–15)
BUN: 16 mg/dL (ref 8–23)
CO2: 23 mmol/L (ref 22–32)
Calcium: 9 mg/dL (ref 8.9–10.3)
Chloride: 103 mmol/L (ref 98–111)
Creatinine, Ser: 1.18 mg/dL (ref 0.61–1.24)
GFR, Estimated: >60 mL/min (ref >60)
Glucose, Bld: 104 mg/dL — ABNORMAL HIGH (ref 70–99)
Potassium: 4.1 mmol/L (ref 3.5–5.1)
Sodium: 140 mmol/L (ref 135–145)
Total Bilirubin: 0.5 mg/dL (ref 0.0–1.2)
Total Protein: 7.7 g/dL (ref 6.5–8.1)

## 2024-10-14 LAB — SAMPLE TO BLOOD BANK

## 2024-10-14 LAB — CBC WITH DIFFERENTIAL/PLATELET
Abs Immature Granulocytes: 0.02 K/uL (ref 0.00–0.07)
Basophils Absolute: 0 K/uL (ref 0.0–0.1)
Basophils Relative: 1 %
Eosinophils Absolute: 0.2 K/uL (ref 0.0–0.5)
Eosinophils Relative: 5 %
HCT: 35.1 % — ABNORMAL LOW (ref 39.0–52.0)
Hemoglobin: 11.6 g/dL — ABNORMAL LOW (ref 13.0–17.0)
Immature Granulocytes: 1 %
Lymphocytes Relative: 5 %
Lymphs Abs: 0.2 K/uL — ABNORMAL LOW (ref 0.7–4.0)
MCH: 33.7 pg (ref 26.0–34.0)
MCHC: 33 g/dL (ref 30.0–36.0)
MCV: 102 fL — ABNORMAL HIGH (ref 80.0–100.0)
Monocytes Absolute: 0.4 K/uL (ref 0.1–1.0)
Monocytes Relative: 9 %
Neutro Abs: 3.2 K/uL (ref 1.7–7.7)
Neutrophils Relative %: 79 %
Platelets: 154 K/uL (ref 150–400)
RBC: 3.44 MIL/uL — ABNORMAL LOW (ref 4.22–5.81)
RDW: 14.4 % (ref 11.5–15.5)
WBC: 4 K/uL (ref 4.0–10.5)
nRBC: 0 % (ref 0.0–0.2)

## 2024-10-14 NOTE — Progress Notes (Signed)
 " Patient Care Team: Logan Norleen PEDLAR, MD as PCP - General (Internal Medicine)  Clinic Day:  10/14/2024  Referring physician: Shona Norleen PEDLAR, MD   CHIEF COMPLAINT:  CC: Hairy cell leukemia.     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  Currently has complete hematologic response.  -Patient has positive T-cell rearrangement.  Even though it does not have treatment implications, studies have shown that it can carry poor prognosis. - Will assess for complete hematologic response and repeat bone marrow biopsy 4 months after completion of treatment.  Patient prefers this to be done in January             - Complete response: Hemoglobin greater than 11, platelets greater than 100, ANC greater than 1500.  Absence of morphologic evidence of LGL on both peripheral blood smear and the bone marrow exam. - Patient symptomatically feels significantly better. - Will discontinue bactrim  and acyclovir  - Will schedule for bone marrow biopsy in 1 month  Return to clinic after bone marrow biopsy to discuss results and further management  Smoking cessation No longer smoking, previously used nicotine  patches.   The patient understands the plans discussed today and is in agreement with them.  He knows to contact our office if he develops concerns prior to his next appointment.  The total time spent in the appointment was 13 minutes for the encounter with patient, including review of chart and various tests results, discussions about plan of  care and coordination of care plan   Logan Dry, MD  [facility] [place] - A DEPT OF Logan Brady [facility] [address] [place] [address] Dept: [phone] Dept Fax: [phone]   Orders Placed This Encounter  Procedures   CT BONE MARROW BIOPSY & ASPIRATION    Standing Status:   Future    Expected Date:   11/14/2024    Expiration Date:   10/14/2025    Reason for Exam (SYMPTOM  OR DIAGNOSIS REQUIRED):   Hairy cell leukemia    Preferred location?:   [facility]   CBC with Differential    Standing Status:   Future    Expected Date:   11/23/2024    Expiration Date:   02/21/2025   Comprehensive metabolic panel    Standing Status:   Future    Expected Date:   11/23/2024    Expiration Date:   02/21/2025   Lactate dehydrogenase    Standing Status:   Future    Expected Date:   11/23/2024    Expiration Date:   02/21/2025   Sample to Blood Bank(Blood Bank Hold)    Standing Status:   Future    Expected Date:   11/23/2024    Expiration Date:   02/21/2025     ONCOLOGY HISTORY:   I have reviewed his chart and materials related to his cancer extensively and collaborated history with the patient. Summary of oncologic history is as follows:   Hairy Cell Leukemia:  -02/102022: Was being seen for pancytopenia.  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  There does appear to be some associated fibrosis which raises the possibility of hairy cell leukemia.  Some neoplastic lymphocytes expresses cyclin D1.  BCL6, CCND1/IgH t(11;14), IgH/BCL2 t(14;18), MALT1 (18q21): Not detected. Normal male karyotype. -10/12/2021: PET/CT: Splenomegaly.  Mild diffuse uptake throughout the spleen is noted again with SUV max of 3 point 0.6.  This is compared with liver activity of 2.89.  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  Mild, nonspecific uptake is also identified throughout the bone marrow of the axial and proximal appendicular skeleton.  The SUV max ranges between 3.05 and 2.3 are on background liver activity.  Mild uptake, above background liver activity, is noted within the porta hepatis region with an SUV max of 3.71.  No adenopathy or mass identified within the neck chest, abdomen or pelvis -04/09/2024: Peripheral blood flow cytometry: 0.5 to 1% of lymphocytes kappa restricted B cells with hairy cell immunophenotype (positive for CD103/CD11c) -04/09/2024: Myeloid NGS: No clinically significant variants identified -05/29/2024: Bone marrow biopsy: Flow cytometry: 40% of lymphocytes are T cells with loss of CD7 and coexpressing CD8.  8% of the lymphocytes are B cells with hairy cell leukemia immunophenotype(CD11c and CD103 positive/kappa restricted)  -Pathology: Suboptimal marrow with involvement by a B-cell population(essentially entire available marrow) consistent with hairy cell leukemia.  This is a limited biopsy with extensive population of CD20 positive B cells which coexpress cyclin D1 but are negative for CD5 and CD10. -06/09/2024: T cell receptor gene arrangement: Positive -06/09/2024: BRAF V600E: Positive/detected -06/18/2024:Persistent subcentimeter mediastinal lymph node anterior to SVC with mild FDG uptake, stable to prior. No new lymphadenopathy. (Deauville score3).Splenomegaly with diffuse FDG uptake, stable to prior. A new non FDG avid nodule in right lung base. -06/29/2024-07/03/2024: Completed 5 days of cladribine   Current Treatment:  Completed 5 days of cladribine   INTERVAL HISTORY:  Logan Brady is a 63 year old male with recent leukemia presenting for follow-up. He is unaccompanied today.   He denies night sweats, SOB, fever, or chills. He notes that he has had some intentional weight gain.  He reported that he is finally feeling  like normal again.  We reviewed  labs today.  Patient symptomatically feels significantly better with resolution of shortness of breath and night sweats.  [place] states that he would like to do his next bone marrow biopsy after Christmas.  I have reviewed the past medical history, past surgical history, social history and family history with the patient and they are unchanged from previous note.  ALLERGIES:  is allergic to ibuprofen, acetaminophen , aspirin, naproxen, and penicillins.  MEDICATIONS:  Current Outpatient Medications  Medication Sig Dispense Refill   acyclovir  (ZOVIRAX ) 400 MG tablet Take 1 tablet (400 mg total) by mouth 2 (two) times daily. 60 tablet 3   albuterol (VENTOLIN HFA) 108 (90 Base) MCG/ACT inhaler Inhale 1-2 puffs into the lungs every 4 (four) hours as needed for wheezing or shortness of breath.     Cyanocobalamin  (B-12 COMPLIANCE INJECTION) 1000 MCG/ML KIT Inject as directed every 30 (thirty) days.     hydrocortisone cream 1 % Apply 1 application topically 4 (four) times daily as needed for itching.     lidocaine -prilocaine  (EMLA ) cream Apply to affected area once 30 g 3   neomycin-bacitracin-polymyxin (NEOSPORIN) ophthalmic ointment Place 1 application into both eyes 4 (four) times daily as needed (styes).  nicotine  (NICODERM CQ ) 14 mg/24hr patch Place 1 patch (14 mg total) onto the skin daily. 28 patch 1   ondansetron  (ZOFRAN ) 8 MG tablet Take 1 tablet (8 mg total) by mouth every 8 (eight) hours as needed for nausea or vomiting. 30 tablet 1   prochlorperazine  (COMPAZINE ) 10 MG tablet Take 1 tablet (10 mg total) by mouth every 6 (six) hours as needed for nausea or vomiting. 30 tablet 1   rizatriptan (MAXALT) 5 MG tablet Take 5 mg by mouth daily as needed for migraine.     sildenafil (VIAGRA) 100 MG tablet Take 100 mg by mouth daily as needed for erectile dysfunction.     sulfamethoxazole -trimethoprim  (BACTRIM  DS) 800-160 MG tablet Take 1 tablet by mouth 2 (two) times daily. Take one tablet by mouth  twice a day on Mondays, Wednesdays, and Fridays 72 tablet 0   tiZANidine (ZANAFLEX) 4 MG tablet Take 4 mg by mouth every 6 (six) hours as needed for muscle spasms.  1   traMADol  (ULTRAM ) 50 MG tablet Take 50 mg by mouth every 6 (six) hours as needed for moderate pain.     No current facility-administered medications for this visit.   Facility-Administered Medications Ordered in Other Visits  Medication Dose Route Frequency Provider Last Rate Last Admin   0.9 %  sodium chloride  infusion (Manually program via Guardrails IV Fluids)  250 mL Intravenous Continuous Amedee Cerrone, MD   Stopped at 07/16/24 1354   cyanocobalamin  ((VITAMIN B-12)) injection 1,000 mcg  1,000 mcg Intramuscular Once Katragadda, Sreedhar, MD       cyanocobalamin  ((VITAMIN B-12)) injection 1,000 mcg  1,000 mcg Intramuscular Once Katragadda, Sreedhar, MD        VITALS:  Blood pressure (!) 158/98, pulse (!) 59, temperature 98.1 F (36.7 C), temperature source Oral, resp. rate 18, height 6' 2 (1.88 m), weight 203 lb 6.4 oz (92.3 kg), SpO2 100%.  Wt Readings from Last 3 Encounters:  10/14/24 203 lb 6.4 oz (92.3 kg)  09/09/24 201 lb (91.2 kg)  08/13/24 195 lb (88.5 kg)    Body mass index is 26.12 kg/m.  Performance status (ECOG): 0 - Asymptomatic  PHYSICAL EXAM:   GENERAL:alert, no distress and comfortable SKIN: skin color, texture, turgor are normal, no rashes or significant lesions NECK: supple, thyroid  normal size, non-tender, without nodularity LYMPH:  no palpable lymphadenopathy in the cervical, axillary or inguinal LUNGS: clear to auscultation and percussion with normal breathing effort HEART: regular rate & rhythm and no murmurs and no lower extremity edema ABDOMEN:abdomen soft, non-tender and normal bowel sounds Musculoskeletal:no cyanosis of digits and no clubbing  NEURO: alert & oriented x 3 with fluent speech  LABORATORY DATA:  I have reviewed the data as listed    Component Value Date/Time   NA  140 10/14/2024 0929   K 4.1 10/14/2024 0929   CL 103 10/14/2024 0929   CO2 23 10/14/2024 0929   GLUCOSE 104 (H) 10/14/2024 0929   BUN 16 10/14/2024 0929   CREATININE 1.18 10/14/2024 0929   CALCIUM 9.0 10/14/2024 0929   PROT 7.7 10/14/2024 0929   ALBUMIN 4.4 10/14/2024 0929   AST 17 10/14/2024 0929   ALT 7 10/14/2024 0929   ALKPHOS 54 10/14/2024 0929   BILITOT 0.5 10/14/2024 0929   GFRNONAA >60 10/14/2024 0929   GFRAA >60 01/19/2019 2141    Lab Results  Component Value Date   WBC 4.0 10/14/2024   NEUTROABS 3.2 10/14/2024   HGB 11.6 (L) 10/14/2024   HCT  35.1 (L) 10/14/2024   MCV 102.0 (H) 10/14/2024   PLT 154 10/14/2024      Chemistry      Component Value Date/Time   NA 140 10/14/2024 0929   K 4.1 10/14/2024 0929   CL 103 10/14/2024 0929   CO2 23 10/14/2024 0929   BUN 16 10/14/2024 0929   CREATININE 1.18 10/14/2024 0929      Component Value Date/Time   CALCIUM 9.0 10/14/2024 0929   ALKPHOS 54 10/14/2024 0929   AST 17 10/14/2024 0929   ALT 7 10/14/2024 0929   BILITOT 0.5 10/14/2024 0929      RADIOGRAPHIC STUDIES: I have personally reviewed the radiological images as listed and agreed with the findings in the report. "

## 2024-10-14 NOTE — Patient Instructions (Signed)
 [facility] at [facility] Discharge Instructions   You were seen and examined today by Dr. Davonna.  She reviewed the results of your lab work which are normal/stable.   We will repeat a bone marrow biopsy in January. We will see you back after the biopsy to discuss the results.    Return as scheduled.    Thank you for choosing [facility] at [facility] to provide your oncology and hematology care.  To afford each patient quality time with our provider, please arrive at least 15 minutes before your scheduled appointment time.   If you have a lab appointment with the Cancer Center please come in thru the Main Entrance and check in at the main information desk.  You need to re-schedule your appointment should you arrive 10 or more minutes late.  We strive to give you quality time with our providers, and arriving late affects you and other patients whose appointments are after yours.  Also, if you no show three or more times for appointments you may be dismissed from the clinic at the providers discretion.     Again, thank you for choosing [facility].  Our hope is that these requests will decrease the amount of time that you wait before being seen by our physicians.       _____________________________________________________________  Should you have questions after your visit to [facility], please contact our office at [phone] and follow the prompts.  Our office hours are 8:00 a.m. and 4:30 p.m. Monday - Friday.  Please note that voicemails left after 4:00 p.m. may not be returned until the following business day.  We are closed weekends and major holidays.  You do have access to a nurse 24-7, just call the main number to the clinic [phone] and do not press any options, hold on the line and a nurse will answer the phone.    For prescription refill requests, have your pharmacy contact our office and allow 72  hours.    Due to Covid, you will need to wear a mask upon entering the hospital. If you do not have a mask, a mask will be given to you at the Main Entrance upon arrival. For doctor visits, patients may have 1 support person age 71 or older with them. For treatment visits, patients can not have anyone with them due to social distancing guidelines and our immunocompromised population.

## 2024-10-15 ENCOUNTER — Other Ambulatory Visit: Payer: Self-pay

## 2024-11-09 ENCOUNTER — Encounter: Payer: Self-pay | Admitting: *Deleted

## 2024-11-10 ENCOUNTER — Encounter: Payer: Self-pay | Admitting: Oncology

## 2024-11-10 ENCOUNTER — Encounter (HOSPITAL_COMMUNITY): Payer: Self-pay | Admitting: Oncology

## 2024-11-15 ENCOUNTER — Other Ambulatory Visit: Payer: Self-pay

## 2024-12-07 NOTE — Progress Notes (Incomplete)
 " Patient Care Team: Shona Norleen PEDLAR, MD as PCP - General (Internal Medicine)  Clinic Day:  10/14/2024  Referring physician: Shona Norleen PEDLAR, MD   CHIEF COMPLAINT:  CC: Hairy cell leukemia.     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  Currently has complete hematologic response.  -Patient has positive T-cell rearrangement.  Even though it does not have treatment implications, studies have shown that it can carry poor prognosis. - Will assess for complete hematologic response and repeat bone marrow biopsy 4 months after completion of treatment.  Patient prefers this to be done in January             - Complete response: Hemoglobin greater than 11, platelets greater than 100, ANC greater than 1500.  Absence of morphologic evidence of LGL on both peripheral blood smear and the bone marrow exam. - Patient symptomatically feels significantly better. - Will discontinue bactrim  and acyclovir  - Will schedule for bone marrow biopsy in 1 month  Return to clinic after bone marrow biopsy to discuss results and further management  Smoking cessation No longer smoking, previously used nicotine  patches.   The patient understands the plans discussed today and is in agreement with them.  He knows to contact our office if he develops concerns prior to his next appointment.  The total time spent in the appointment was *** minutes for the encounter with patient, including review of chart and various tests results, discussions about plan of  care and coordination of care plan   I,Helena R Teague,acting as a scribe for [name], MD.,have documented all relevant documentation on the behalf of [name], MD,as directed by  [name], MD while in the presence of [name], MD.  ***   Verneta JONELLE Ege  [facility]  - A DEPT OF [name] [address] [place] [address] Dept: [phone] Dept Fax: [phone]   No orders of the defined types were placed in this encounter.    ONCOLOGY HISTORY:   I have reviewed his chart and materials related to his cancer extensively and collaborated history with the patient. Summary of oncologic history is as follows:   Hairy Cell Leukemia:  -02/102022: Was being seen for pancytopenia.  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  There does appear to be some associated fibrosis which raises the possibility of hairy cell leukemia.  Some neoplastic lymphocytes expresses cyclin D1.  BCL6, CCND1/IgH t(11;14), IgH/BCL2 t(14;18), MALT1 (18q21): Not detected. Normal male karyotype. -10/12/2021: PET/CT: Splenomegaly.  Mild diffuse uptake throughout the spleen is noted again with SUV max of 3 point 0.6.  This is compared with liver activity of 2.89.  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  Mild, nonspecific uptake is also identified throughout the bone marrow of the axial and proximal appendicular skeleton.  The SUV max ranges between 3.05 and 2.3 are on background liver activity.  Mild uptake,  above background liver activity, is noted within the porta hepatis region with an SUV max of 3.71.  No adenopathy or mass identified within the neck chest, abdomen or pelvis -04/09/2024: Peripheral blood flow cytometry: 0.5 to 1% of lymphocytes kappa restricted B cells  with hairy cell immunophenotype (positive for CD103/CD11c) -04/09/2024: Myeloid NGS: No clinically significant variants identified -05/29/2024: Bone marrow biopsy: Flow cytometry: 40% of lymphocytes are T cells with loss of CD7 and coexpressing CD8.  8% of the lymphocytes are B cells with hairy cell leukemia immunophenotype(CD11c and CD103 positive/kappa restricted)  -Pathology: Suboptimal marrow with involvement by a B-cell population(essentially entire available marrow) consistent with hairy cell leukemia.  This is a limited biopsy with extensive population of CD20 positive B cells which coexpress cyclin D1 but are negative for CD5 and CD10. -06/09/2024: T cell receptor gene arrangement: Positive -06/09/2024: BRAF V600E: Positive/detected -06/18/2024:Persistent subcentimeter mediastinal lymph node anterior to SVC with mild FDG uptake, stable to prior. No new lymphadenopathy. (Deauville score3).Splenomegaly with diffuse FDG uptake, stable to prior. A new non FDG avid nodule in right lung base. -06/29/2024-07/03/2024: Completed 5 days of cladribine   Current Treatment:  Completed 5 days of cladribine   INTERVAL HISTORY:  Logan Brady is a 64 year old male with recent leukemia presenting for follow-up. He is unaccompanied today.    I have reviewed the past medical history, past surgical history, social history and family history with the patient and they are unchanged from previous note.  ALLERGIES:  is allergic to ibuprofen, acetaminophen , aspirin, naproxen, and penicillins.  MEDICATIONS:  Current Outpatient Medications  Medication Sig Dispense Refill   acyclovir  (ZOVIRAX ) 400 MG tablet Take 1 tablet (400 mg total) by mouth 2 (two) times daily. 60 tablet 3   albuterol (VENTOLIN HFA) 108 (90 Base) MCG/ACT inhaler Inhale 1-2 puffs into the lungs every 4 (four) hours as needed for wheezing or shortness of breath.     Cyanocobalamin  (B-12 COMPLIANCE INJECTION) 1000 MCG/ML KIT Inject as directed  every 30 (thirty) days.     hydrocortisone cream 1 % Apply 1 application topically 4 (four) times daily as needed for itching.     lidocaine -prilocaine  (EMLA ) cream Apply to affected area once 30 g 3   neomycin-bacitracin-polymyxin (NEOSPORIN) ophthalmic ointment Place 1 application into both eyes 4 (four) times daily as needed (styes).     nicotine  (NICODERM CQ ) 14 mg/24hr patch Place 1 patch (14 mg total) onto the skin daily. 28 patch 1   ondansetron  (ZOFRAN ) 8 MG tablet Take 1 tablet (8 mg total) by mouth every 8 (eight) hours as needed for nausea or vomiting. 30 tablet 1   prochlorperazine  (COMPAZINE ) 10 MG tablet Take 1 tablet (10 mg total) by mouth every 6 (six) hours as needed for nausea or vomiting. 30 tablet 1   rizatriptan (MAXALT) 5 MG tablet Take 5 mg by mouth daily as needed for migraine.     sildenafil (VIAGRA) 100 MG tablet Take 100 mg by mouth daily as needed for erectile dysfunction.     sulfamethoxazole -trimethoprim  (BACTRIM  DS) 800-160 MG tablet Take 1 tablet by mouth 2 (two) times daily. Take one tablet by mouth twice a day on Mondays, Wednesdays, and Fridays 72 tablet 0   tiZANidine (ZANAFLEX) 4 MG tablet Take 4 mg by mouth every 6 (six) hours as needed for muscle spasms.  1   traMADol  (ULTRAM ) 50 MG tablet Take 50 mg by mouth every 6 (six) hours as needed for moderate pain.     No current facility-administered  medications for this visit.   Facility-Administered Medications Ordered in Other Visits  Medication Dose Route Frequency Provider Last Rate Last Admin   0.9 %  sodium chloride  infusion (Manually program via Guardrails IV Fluids)  250 mL Intravenous Continuous Kandala, Hyndavi, MD   Stopped at 07/16/24 1354   cyanocobalamin  ((VITAMIN B-12)) injection 1,000 mcg  1,000 mcg Intramuscular Once Katragadda, Sreedhar, MD       cyanocobalamin  ((VITAMIN B-12)) injection 1,000 mcg  1,000 mcg Intramuscular Once Katragadda, Sreedhar, MD        VITALS:  There were no vitals taken  for this visit.  Wt Readings from Last 3 Encounters:  10/14/24 203 lb 6.4 oz (92.3 kg)  09/09/24 201 lb (91.2 kg)  08/13/24 195 lb (88.5 kg)    There is no height or weight on file to calculate BMI.  Performance status (ECOG): 0 - Asymptomatic  PHYSICAL EXAM:   GENERAL:alert, no distress and comfortable SKIN: skin color, texture, turgor are normal, no rashes or significant lesions NECK: supple, thyroid  normal size, non-tender, without nodularity LYMPH:  no palpable lymphadenopathy in the cervical, axillary or inguinal LUNGS: clear to auscultation and percussion with normal breathing effort HEART: regular rate & rhythm and no murmurs and no lower extremity edema ABDOMEN:abdomen soft, non-tender and normal bowel sounds Musculoskeletal:no cyanosis of digits and no clubbing  NEURO: alert & oriented x 3 with fluent speech  LABORATORY DATA:  I have reviewed the data as listed    Component Value Date/Time   NA 140 10/14/2024 0929   K 4.1 10/14/2024 0929   CL 103 10/14/2024 0929   CO2 23 10/14/2024 0929   GLUCOSE 104 (H) 10/14/2024 0929   BUN 16 10/14/2024 0929   CREATININE 1.18 10/14/2024 0929   CALCIUM 9.0 10/14/2024 0929   PROT 7.7 10/14/2024 0929   ALBUMIN 4.4 10/14/2024 0929   AST 17 10/14/2024 0929   ALT 7 10/14/2024 0929   ALKPHOS 54 10/14/2024 0929   BILITOT 0.5 10/14/2024 0929   GFRNONAA >60 10/14/2024 0929   GFRAA >60 01/19/2019 2141    Lab Results  Component Value Date   WBC 4.0 10/14/2024   NEUTROABS 3.2 10/14/2024   HGB 11.6 (L) 10/14/2024   HCT 35.1 (L) 10/14/2024   MCV 102.0 (H) 10/14/2024   PLT 154 10/14/2024      Chemistry      Component Value Date/Time   NA 140 10/14/2024 0929   K 4.1 10/14/2024 0929   CL 103 10/14/2024 0929   CO2 23 10/14/2024 0929   BUN 16 10/14/2024 0929   CREATININE 1.18 10/14/2024 0929      Component Value Date/Time   CALCIUM 9.0 10/14/2024 0929   ALKPHOS 54 10/14/2024 0929   AST 17 10/14/2024 0929   ALT 7 10/14/2024  0929   BILITOT 0.5 10/14/2024 0929      RADIOGRAPHIC STUDIES: I have personally reviewed the radiological images as listed and agreed with the findings in the report. "

## 2024-12-08 ENCOUNTER — Inpatient Hospital Stay: Admitting: Oncology

## 2024-12-08 ENCOUNTER — Inpatient Hospital Stay

## 2024-12-09 ENCOUNTER — Telehealth: Payer: Self-pay | Admitting: *Deleted

## 2024-12-09 ENCOUNTER — Other Ambulatory Visit: Payer: Self-pay

## 2024-12-09 ENCOUNTER — Inpatient Hospital Stay: Attending: Hematology

## 2024-12-09 DIAGNOSIS — C9140 Hairy cell leukemia not having achieved remission: Secondary | ICD-10-CM | POA: Insufficient documentation

## 2024-12-09 DIAGNOSIS — D649 Anemia, unspecified: Secondary | ICD-10-CM

## 2024-12-09 LAB — COMPREHENSIVE METABOLIC PANEL WITH GFR
ALT: 25 U/L (ref 0–44)
AST: 28 U/L (ref 15–41)
Albumin: 3.8 g/dL (ref 3.5–5.0)
Alkaline Phosphatase: 120 U/L (ref 38–126)
Anion gap: 16 — ABNORMAL HIGH (ref 5–15)
BUN: 21 mg/dL (ref 8–23)
CO2: 22 mmol/L (ref 22–32)
Calcium: 9 mg/dL (ref 8.9–10.3)
Chloride: 98 mmol/L (ref 98–111)
Creatinine, Ser: 1.38 mg/dL — ABNORMAL HIGH (ref 0.61–1.24)
GFR, Estimated: 57 mL/min — ABNORMAL LOW
Glucose, Bld: 161 mg/dL — ABNORMAL HIGH (ref 70–99)
Potassium: 4.7 mmol/L (ref 3.5–5.1)
Sodium: 135 mmol/L (ref 135–145)
Total Bilirubin: 0.6 mg/dL (ref 0.0–1.2)
Total Protein: 7.7 g/dL (ref 6.5–8.1)

## 2024-12-09 LAB — CBC WITH DIFFERENTIAL/PLATELET
Abs Immature Granulocytes: 0.01 10*3/uL (ref 0.00–0.07)
Basophils Absolute: 0 10*3/uL (ref 0.0–0.1)
Basophils Relative: 1 %
Eosinophils Absolute: 0.1 10*3/uL (ref 0.0–0.5)
Eosinophils Relative: 1 %
HCT: 31.4 % — ABNORMAL LOW (ref 39.0–52.0)
Hemoglobin: 9.9 g/dL — ABNORMAL LOW (ref 13.0–17.0)
Immature Granulocytes: 0 %
Lymphocytes Relative: 3 %
Lymphs Abs: 0.1 10*3/uL — ABNORMAL LOW (ref 0.7–4.0)
MCH: 28.9 pg (ref 26.0–34.0)
MCHC: 31.5 g/dL (ref 30.0–36.0)
MCV: 91.5 fL (ref 80.0–100.0)
Monocytes Absolute: 0.4 10*3/uL (ref 0.1–1.0)
Monocytes Relative: 7 %
Neutro Abs: 4.8 10*3/uL (ref 1.7–7.7)
Neutrophils Relative %: 88 %
Platelets: 267 10*3/uL (ref 150–400)
RBC: 3.43 MIL/uL — ABNORMAL LOW (ref 4.22–5.81)
RDW: 15.2 % (ref 11.5–15.5)
WBC: 5.4 10*3/uL (ref 4.0–10.5)
nRBC: 0 % (ref 0.0–0.2)

## 2024-12-09 LAB — SAMPLE TO BLOOD BANK

## 2024-12-09 LAB — IRON AND TIBC
Iron: 26 ug/dL — ABNORMAL LOW (ref 45–182)
Saturation Ratios: 14 % — ABNORMAL LOW (ref 17.9–39.5)
TIBC: 188 ug/dL — ABNORMAL LOW (ref 250–450)
UIBC: 161 ug/dL

## 2024-12-09 LAB — LACTATE DEHYDROGENASE: LDH: 296 U/L — ABNORMAL HIGH (ref 105–235)

## 2024-12-09 LAB — FERRITIN: Ferritin: 573 ng/mL — ABNORMAL HIGH (ref 24–336)

## 2024-12-09 NOTE — Telephone Encounter (Signed)
 Per Dr. Davonna, she would like for patient to come in today for fluids due to abnormal creatinine.  Patient states he is already home and does not feel like coming back in today, however will come @ 9:30 in the morning.  States he vomited several times after leaving office following labs.  Attempted to encourage him to come in today, however he declined.

## 2024-12-10 ENCOUNTER — Inpatient Hospital Stay

## 2024-12-10 ENCOUNTER — Inpatient Hospital Stay: Admitting: Oncology
